# Patient Record
Sex: Male | Born: 1960 | Race: White | Hispanic: No | Smoking: Never smoker
Health system: Southern US, Community
[De-identification: ages and names within clinical notes are randomized; demographics above are authoritative.]

## PROBLEM LIST (undated history)

## (undated) DIAGNOSIS — IMO0001 Reserved for inherently not codable concepts without codable children: Secondary | ICD-10-CM

## (undated) DIAGNOSIS — E7800 Pure hypercholesterolemia, unspecified: Secondary | ICD-10-CM

## (undated) DIAGNOSIS — F419 Anxiety disorder, unspecified: Secondary | ICD-10-CM

## (undated) DIAGNOSIS — K219 Gastro-esophageal reflux disease without esophagitis: Secondary | ICD-10-CM

## (undated) HISTORY — DX: Reserved for inherently not codable concepts without codable children: IMO0001

## (undated) HISTORY — DX: Pure hypercholesterolemia, unspecified: E78.00

## (undated) HISTORY — DX: Anxiety disorder, unspecified: F41.9

## (undated) HISTORY — DX: Gastro-esophageal reflux disease without esophagitis: K21.9

## (undated) HISTORY — PX: FOOT SURGERY: SHX648

---

## 2013-05-08 ENCOUNTER — Ambulatory Visit (INDEPENDENT_AMBULATORY_CARE_PROVIDER_SITE_OTHER): Payer: BC Managed Care – PPO | Admitting: Podiatry

## 2013-05-08 ENCOUNTER — Encounter: Payer: Self-pay | Admitting: Podiatry

## 2013-05-08 VITALS — BP 162/108 | HR 77 | Resp 16 | Ht 71.0 in | Wt 235.0 lb

## 2013-05-08 DIAGNOSIS — B351 Tinea unguium: Secondary | ICD-10-CM

## 2013-05-08 DIAGNOSIS — L600 Ingrowing nail: Secondary | ICD-10-CM

## 2013-05-08 NOTE — Progress Notes (Signed)
Subjective:     Patient ID: Jordan Welch, male   DOB: 07-Aug-1960, 52 y.o.   MRN: [id number]  HPI patient complains of a very painful third nail right with chronic ingrown toenail at least 3 months duration. States that he is tried to soak it and treatment without relief of symptoms and also complains of discoloration of several nails  Review of Systems  All other systems reviewed and are negative.       Objective:   Physical Exam  Nursing note and vitals reviewed. Constitutional: He appears well-developed and well-nourished.  Cardiovascular: Intact distal pulses.   Musculoskeletal: Normal range of motion.  Skin: Skin is warm.   third nail left is quite incurvated and thickened with the C-shaped appearance. It has some redness and distal drainage from the medial border. The fourth nail right in both big toenails have yellow discoloration    Assessment:     Ingrown damage third nail right entire nail and mycotic nail infection    Plan:     H&P performed and discussed correction of nailbed. Patient wants to have this done and I have recommended permanent procedure and explained risk. Patient wants surgery and signs consent form. Infiltrated the right third toe with 60 mg Xylocaine Marcaine mixture and removed the nail I exposed the matrix and applied chemical 4 applications 30 seconds followed by alcohol lavage. Sterile dressing applied. Formula 3 to use on remaining fungal nails with long-term consideration for laser or oral medication

## 2013-05-08 NOTE — Patient Instructions (Signed)

## 2013-05-08 NOTE — Progress Notes (Signed)
N SORE L 3RD DIGIT TOENAIL RIGHT FOOT/FUNGUS TOENAILS B/L D 32M O SLOWLY C WORSE A WALKING, SHOES T PT TRIMS TOENAILS,

## 2019-06-01 ENCOUNTER — Telehealth: Payer: Self-pay | Admitting: *Deleted

## 2019-06-02 ENCOUNTER — Ambulatory Visit
Payer: No Typology Code available for payment source | Attending: Student in an Organized Health Care Education/Training Program | Admitting: Student in an Organized Health Care Education/Training Program

## 2019-06-02 ENCOUNTER — Other Ambulatory Visit: Payer: Self-pay

## 2019-06-02 ENCOUNTER — Encounter: Payer: Self-pay | Admitting: Student in an Organized Health Care Education/Training Program

## 2019-06-02 VITALS — BP 139/70 | HR 74 | Temp 97.5°F | Ht 71.0 in | Wt 240.0 lb

## 2019-06-02 DIAGNOSIS — Z9889 Other specified postprocedural states: Secondary | ICD-10-CM | POA: Insufficient documentation

## 2019-06-02 DIAGNOSIS — M47816 Spondylosis without myelopathy or radiculopathy, lumbar region: Secondary | ICD-10-CM | POA: Insufficient documentation

## 2019-06-02 DIAGNOSIS — G894 Chronic pain syndrome: Secondary | ICD-10-CM | POA: Insufficient documentation

## 2019-06-02 DIAGNOSIS — Z86018 Personal history of other benign neoplasm: Secondary | ICD-10-CM | POA: Insufficient documentation

## 2019-06-02 DIAGNOSIS — M5410 Radiculopathy, site unspecified: Secondary | ICD-10-CM | POA: Insufficient documentation

## 2019-06-02 DIAGNOSIS — M5136 Other intervertebral disc degeneration, lumbar region: Secondary | ICD-10-CM | POA: Diagnosis not present

## 2019-06-02 DIAGNOSIS — M542 Cervicalgia: Secondary | ICD-10-CM | POA: Diagnosis present

## 2019-06-02 DIAGNOSIS — M5412 Radiculopathy, cervical region: Secondary | ICD-10-CM | POA: Diagnosis not present

## 2019-06-02 DIAGNOSIS — M47812 Spondylosis without myelopathy or radiculopathy, cervical region: Secondary | ICD-10-CM | POA: Insufficient documentation

## 2019-06-02 MED ORDER — DICLOFENAC SODIUM 75 MG PO TBEC: 75.0000 mg | DELAYED_RELEASE_TABLET | Freq: Two times a day (BID) | ORAL | 0 refills | Status: AC

## 2019-06-02 MED ORDER — GABAPENTIN 300 MG PO CAPS: ORAL_CAPSULE | ORAL | 0 refills | Status: DC

## 2019-06-02 NOTE — Progress Notes (Signed)
Patient's Name: Jordan Welch  MRN: [id number]  Referring Provider: [facility]  DOB: [birth year]  PCP: [facility]  DOS: 06/02/2019  Note by: Edward Jolly, MD  Service setting: Ambulatory outpatient  Specialty: Interventional Pain Management  Location: [facility] (AMB) Pain Management Facility  Visit type: Initial Patient Evaluation  Patient type: New Patient   Primary Reason(s) for Visit: Encounter for initial evaluation of one or more chronic problems (new to examiner) potentially causing chronic pain, and posing a threat to normal musculoskeletal function. (Level of risk: High) CC: Neck Pain  HPI  Mr. Tammen is a 58 y.o. year old, male patient, who comes today to see [place] for the first time for an initial evaluation of his chronic pain. He has Cervicalgia; Cervical facet joint syndrome; Cervical spondylosis; Lumbar facet arthropathy; Lumbar degenerative disc disease; Chronic pain syndrome; and S/P excision of neuroma on their problem list. Today he comes in for evaluation of his Neck Pain  Pain Assessment: Location: Left Neck(shoulder) Radiating: Denies Onset: More than a month ago Duration: Chronic pain Quality: Sharp, Aching Severity: 3 /10 (subjective, self-reported pain score)  Note: Reported level is compatible with observation.                         When using our objective Pain Scale, levels between 6 and 10/10 are said to belong in an emergency room, as it progressively worsens from a 6/10, described as severely limiting, requiring emergency care not usually available at an outpatient pain management facility. At a 6/10 level, communication becomes difficult and requires great effort. Assistance to reach the emergency department may be required. Facial flushing and profuse sweating along with potentially dangerous increases in heart rate and blood pressure will be evident. Effect on ADL: limits my daily actvities Timing: Constant Modifying factors: nothing BP: 139/70  HR:  74  Onset and Duration: Gradual and Date of onset: more than 10 years Cause of pain: Unknown Severity: Getting worse, NAS-11 at its worse: 6/10, NAS-11 at its best: 6/10, NAS-11 now: 2/10 and NAS-11 on the average: 3/10 Timing: Not influenced by the time of the day and During activity or exercise Aggravating Factors: Kneeling, Lifiting, Prolonged standing, Walking and Working Alleviating Factors: Lying down, Resting and Sleeping Associated Problems: Fatigue Quality of Pain: Aching, Annoying, Uncomfortable and Work related Previous Examinations or Tests: Biopsy, CT scan, MRI scan, X-rays and Nerve conduction test Previous Treatments: Physical Therapy  The patient comes into the clinics today for the first time for a chronic pain management evaluation.   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  Neck pain: History of neck pain for many years.  States that it does radiate to his left shoulder.  Does not change during the day.  Describes it as achy and throbbing.  Does have occasional radiation to his left elbow and his left forearm.  Previous cervical MRI done 318 shows multilevel degenerative changes throughout his cervical spine, mild central canal stenosis at C5-C6 and C6-C7, multilevel neuroforaminal stenosis most pronounced at C5-C6 and C6-C7.  Patient denies ever having done a cervical epidural steroid injection or cervical facet block.  He has tried physical therapy approximately 2 years ago.  He occasionally does do home exercises.  Low back pain: States that he has occasional  low back pain that radiates to his right buttock and right hip.  Does not radiate beyond that.  It is worse with weightbearing.  He has tried gabapentin, Flexeril, Robaxin, NSAIDs in the past which were not very effective.  Lumbar MRI  shows minimal spondylitic changes and discopathy at L5-S1.  L5-S1 broad-based disc bulge contacts the anterior aspect of the thecal sac and there could be an annular fissure there as well.  Patient denies having done PT for his low back.  Patient also has a history of right foot pain.  He has a history of neuroma removal of his metatarsal approximately 15 years ago.  Has residual numbness and paresthesias of his toes at times.  Patient is not on chronic opioid therapy.  We will focus on interventional pain management and nonopioid analgesics.   Meds   Current Outpatient Medications:  .  atorvastatin (LIPITOR) 40 MG tablet, 20 mg daily. , Disp: , Rfl:  .  citalopram (CELEXA) 40 MG tablet, 20 mg daily. , Disp: , Rfl:  .  famotidine (PEPCID) 20 MG tablet, Take 20 mg by mouth 2 (two) times daily., Disp: , Rfl:  .  hydrOXYzine (ATARAX/VISTARIL) 10 MG tablet, Take 10 mg by mouth 2 times daily at 12 noon and 4 pm., Disp: , Rfl:  .  metFORMIN (GLUMETZA) 500 MG (MOD) 24 hr tablet, Take 500 mg by mouth daily with breakfast., Disp: , Rfl:  .  omeprazole (PRILOSEC) 20 MG capsule, Take 20 mg by mouth daily., Disp: , Rfl:  .  traZODone (DESYREL) 50 MG tablet, 150 mg daily. , Disp: , Rfl:  .  diclofenac (VOLTAREN) 75 MG EC tablet, Take 1 tablet (75 mg total) by mouth 2 (two) times daily., Disp: 60 tablet, Rfl: 0 .  gabapentin (NEURONTIN) 300 MG capsule, Take 1 capsule (300 mg total) by mouth at bedtime for 30 days, THEN 1 capsule (300 mg total) 2 (two) times daily., Disp: 150 capsule, Rfl: 0  Imaging Review    Lumbar MRI from 12/18/2018: Minimal spondylitic changes and discopathy at L5-S1 without findings of spinal canal or neuroforaminal stenosis.  Of note at L5-S1 there is mild intervertebral disc height loss and desiccation.  Broad-based disc bulge which contacts the anterior aspect of the thecal sac.  Abnormal T2 signal within the posterior aspect of the disc is suggestive of annular fissure.  Minimal  facet joint degenerative disease.  Cervical MRI: Multilevel degenerative disc changes, mild central canal stenosis at C5-C6, multilevel neuroforaminal stenosis.  ROS  Cardiovascular: Abnormal heart rhythm Pulmonary or Respiratory: No reported pulmonary signs or symptoms such as wheezing and difficulty taking a deep full breath (Asthma), difficulty blowing air out (Emphysema), coughing up mucus (Bronchitis), persistent dry cough, or temporary stoppage of breathing during sleep Neurological: No reported neurological signs or symptoms such as seizures, abnormal skin sensations, urinary and/or fecal incontinence, being born with an abnormal open spine and/or a tethered spinal cord Review of Past Neurological Studies: No results found for this or any previous visit. Psychological-Psychiatric: Anxiousness, Depressed and Prone to panicking Gastrointestinal: Reflux or heatburn Genitourinary: Passing kidney stones Hematological: No reported hematological signs or symptoms such as prolonged bleeding, low or poor functioning platelets, bruising or bleeding easily, hereditary bleeding problems, low energy levels due to low hemoglobin or being anemic Endocrine: No reported endocrine signs or symptoms such as high or low blood sugar, rapid heart rate due to high thyroid levels, obesity or weight gain due to slow thyroid or thyroid disease Rheumatologic:  No reported rheumatological signs and symptoms such as fatigue, joint pain, tenderness, swelling, redness, heat, stiffness, decreased range of motion, with or without associated rash Musculoskeletal: Negative for myasthenia gravis, muscular dystrophy, multiple sclerosis or malignant hyperthermia Work History: Working part time  Allergies  Mr. Mineer has No Known Allergies.  Note: Lab results reviewed.  PFSH  Drug: Mr. Champine  reports no history of drug use. Alcohol:  reports current alcohol use. Tobacco:  reports that he has never smoked. He has never used  smokeless tobacco. Medical:  has a past medical history of Anxiety, High cholesterol, and Reflux. Family: family history is not on file.  Past Surgical History:  Procedure Laterality Date  . FOOT SURGERY Right    Active Ambulatory Problems    Diagnosis Date Noted  . Cervicalgia 06/02/2019  . Cervical facet joint syndrome 06/02/2019  . Cervical spondylosis 06/02/2019  . Lumbar facet arthropathy 06/02/2019  . Lumbar degenerative disc disease 06/02/2019  . Chronic pain syndrome 06/02/2019  . S/P excision of neuroma 06/02/2019   Resolved Ambulatory Problems    Diagnosis Date Noted  . No Resolved Ambulatory Problems   Past Medical History:  Diagnosis Date  . Anxiety   . High cholesterol   . Reflux    Constitutional Exam  General appearance: Well nourished, well developed, and well hydrated. In no apparent acute distress Vitals:   06/02/19 1113  BP: 139/70  Pulse: 74  Temp: (!) 97.5 F (36.4 C)  SpO2: 100%  Weight: 240 lb (108.9 kg)  Height:  (1.803 m)   BMI Assessment: Estimated body mass index is 33.47 kg/m as calculated from the following:   Height as of this encounter:  (1.803 m).   Weight as of this encounter: 240 lb (108.9 kg).  BMI interpretation table: BMI level Category Range association with higher incidence of chronic pain  <18 kg/m2 Underweight   18.5-24.9 kg/m2 Ideal body weight   25-29.9 kg/m2 Overweight Increased incidence by 20%  30-34.9 kg/m2 Obese (Class I) Increased incidence by 68%  35-39.9 kg/m2 Severe obesity (Class II) Increased incidence by 136%  >40 kg/m2 Extreme obesity (Class III) Increased incidence by 254%   Patient's current BMI Ideal Body weight  Body mass index is 33.47 kg/m. Ideal body weight: 75.3 kg (166 lb 0.1 oz) Adjusted ideal body weight: 88.7 kg (195 lb 9.7 oz)   BMI Readings from Last 4 Encounters:  06/02/19 33.47 kg/m  05/08/13 32.78 kg/m   Wt Readings from Last 4 Encounters:  06/02/19 240 lb (108.9 kg)   05/08/13 235 lb (106.6 kg)  Psych/Mental status: Alert, oriented x 3 (person, place, & time)       Eyes: PERLA Respiratory: No evidence of acute respiratory distress  Cervical Spine Area Exam  Skin & Axial Inspection: No masses, redness, edema, swelling, or associated skin lesions Alignment: Symmetrical Functional ROM: Pain restricted ROM, to the left Stability: No instability detected Muscle Tone/Strength: Functionally intact. No obvious neuro-muscular anomalies detected. Sensory (Neurological): Dermatomal pain pattern and MSK Palpation: No palpable anomalies              Upper Extremity (UE) Exam    Side: Right upper extremity  Side: Left upper extremity  Skin & Extremity Inspection: Skin color, temperature, and hair growth are WNL. No peripheral edema or cyanosis. No masses, redness, swelling, asymmetry, or associated skin lesions. No contractures.  Skin & Extremity Inspection: Skin color, temperature, and hair growth are WNL. No peripheral edema or cyanosis. No masses,  redness, swelling, asymmetry, or associated skin lesions. No contractures.  Functional ROM: Unrestricted ROM          Functional ROM: Pain restricted ROM          Muscle Tone/Strength: Functionally intact. No obvious neuro-muscular anomalies detected.  Muscle Tone/Strength: Functionally intact. No obvious neuro-muscular anomalies detected.  Sensory (Neurological): Unimpaired          Sensory (Neurological): Musculoskeletal pain pattern          Palpation: No palpable anomalies              Palpation: No palpable anomalies              Provocative Test(s):  Phalen's test: deferred Tinel's test: deferred Apley's scratch test (touch opposite shoulder):  Action 1 (Across chest): deferred Action 2 (Overhead): deferred Action 3 (LB reach): deferred   Provocative Test(s):  Phalen's test: deferred Tinel's test: deferred Apley's scratch test (touch opposite shoulder):  Action 1 (Across chest): deferred Action 2  (Overhead): deferred Action 3 (LB reach): deferred    Thoracic Spine Area Exam  Skin & Axial Inspection: No masses, redness, or swelling Alignment: Symmetrical Functional ROM: Unrestricted ROM Stability: No instability detected Muscle Tone/Strength: Functionally intact. No obvious neuro-muscular anomalies detected. Sensory (Neurological): Unimpaired Muscle strength & Tone: No palpable anomalies  Lumbar Spine Area Exam  Skin & Axial Inspection: No masses, redness, or swelling Alignment: Symmetrical Functional ROM: Unrestricted ROM       Stability: No instability detected Muscle Tone/Strength: Functionally intact. No obvious neuro-muscular anomalies detected. Sensory (Neurological): Unimpaired Palpation: No palpable anomalies       Provocative Tests: Hyperextension/rotation test: deferred today       Lumbar quadrant test (Kemp's test): deferred today       Lateral bending test: deferred today       Patrick's Maneuver: deferred today                   FABER* test: deferred today                   S-I anterior distraction/compression test: deferred today         S-I lateral compression test: deferred today         S-I Thigh-thrust test: deferred today         S-I Gaenslen's test: deferred today         *(Flexion, ABduction and External Rotation)  Gait & Posture Assessment  Ambulation: Unassisted Gait: Relatively normal for age and body habitus Posture: WNL   Lower Extremity Exam    Side: Right lower extremity  Side: Left lower extremity  Stability: No instability observed          Stability: No instability observed          Skin & Extremity Inspection: Skin color, temperature, and hair growth are WNL. No peripheral edema or cyanosis. No masses, redness, swelling, asymmetry, or associated skin lesions. No contractures.  Skin & Extremity Inspection: Skin color, temperature, and hair growth are WNL. No peripheral edema or cyanosis. No masses, redness, swelling, asymmetry, or  associated skin lesions. No contractures.  Functional ROM: Unrestricted ROM                  Functional ROM: Unrestricted ROM                  Muscle Tone/Strength: Functionally intact. No obvious neuro-muscular anomalies detected.  Muscle Tone/Strength: Functionally intact. No obvious neuro-muscular anomalies detected.  Sensory (Neurological): Unimpaired        Sensory (Neurological): Unimpaired        DTR: Patellar: 2+: normal Achilles: 2+: normal Plantar: deferred today  DTR: Patellar: 2+: normal Achilles: 2+: normal Plantar: deferred today  Palpation: No palpable anomalies  Palpation: No palpable anomalies   Assessment  Primary Diagnosis & Pertinent Problem List: The primary encounter diagnosis was Cervical radicular pain. Diagnoses of Cervical facet joint syndrome, Cervical spondylosis, Lumbar facet arthropathy, Lumbar degenerative disc disease, Chronic pain syndrome, Cervicalgia, and S/P excision of neuroma were also pertinent to this visit.  Visit Diagnosis (New problems to examiner): 1. Cervical radicular pain   2. Cervical facet joint syndrome   3. Cervical spondylosis   4. Lumbar facet arthropathy   5. Lumbar degenerative disc disease   6. Chronic pain syndrome   7. Cervicalgia   8. S/P excision of neuroma    General Recommendations: The pain condition that the patient suffers from is best treated with a multidisciplinary approach that involves an increase in physical activity to prevent de-conditioning and worsening of the pain cycle, as well as psychological counseling (formal and/or informal) to address the co-morbid psychological affects of pain. Treatment will often involve judicious use of pain medications and interventional procedures to decrease the pain, allowing the patient to participate in the physical activity that will ultimately produce long-lasting pain reductions. The goal of the multidisciplinary approach is to return the patient to a higher level of overall  function and to restore their ability to perform activities of daily living.   Plan of Care (Initial workup plan)  I believe a large component of the patient's neck pain could be related to his cervical spine pathology.  Would like to obtain updated MRI with imaging that I can review myself to evaluate for neuroforaminal and canal stenosis.  After cervical MRI can discuss therapeutic options which may include diagnostic cervical epidural steroid injection and or cervical facet medial branch nerve blocks and possible radiofrequency ablation.  We will wait to discuss therapeutic options once we have MRI results.  In regard to the patient's low back pain, although the patient does have L5-S1 disc herniation, his pain does not sound radicular in nature completely.  He has isolated right foot pain as well as a result of his neuroma excision.  Recommend medication management as below.  Will order MRI of C-spine and discussed therapeutic plan in regards to interventional options.   Imaging Orders     MRI C-Spine w/o contrast Pharmacotherapy (current): Medications ordered:  Meds ordered this encounter  Medications  . gabapentin (NEURONTIN) 300 MG capsule    Sig: Take 1 capsule (300 mg total) by mouth at bedtime for 30 days, THEN 1 capsule (300 mg total) 2 (two) times daily.    Dispense:  150 capsule    Refill:  0  . diclofenac (VOLTAREN) 75 MG EC tablet    Sig: Take 1 tablet (75 mg total) by mouth 2 (two) times daily.    Dispense:  60 tablet    Refill:  0   Medications administered during this visit: [name] had no medications administered during this visit.   Pharmacological management options:  Opioid Analgesics: Focus on nonopioid analgesics  Membrane stabilizer: On gabapentin.  Can consider Lyrica or Cymbalta in future  Muscle relaxant: Tried and failed Flexeril, Robaxin  NSAID: Trial of diclofenac as below  Other analgesic(s): To be determined at a later time   Interventional  management options: Mr. Alves was  informed that there is no guarantee that he would be a candidate for interventional therapies. The decision will be based on the results of diagnostic studies, as well as Mr. Dosher risk profile.  Procedure(s) under consideration:  Cervical epidural steroid injection Cervical facet medial branch nerve blocks Cervical radiofrequency ablation Lumbar epidural steroid injection Lumbar facet blocks   Provider-requested follow-up: Return for pt will call for 2nd visit , After Imaging (C-MRI).  No future appointments.  Primary Care Physician: [facility] Location[facility] Note by: [name], MD Date: 06/02/2019; Time: 1:16 PM  Note: This dictation was prepared with Dragon dictation. Any transcriptional errors that may result from this process are unintentional.

## 2019-06-03 ENCOUNTER — Telehealth: Payer: Self-pay | Admitting: *Deleted

## 2019-06-16 ENCOUNTER — Other Ambulatory Visit: Payer: Self-pay

## 2019-06-16 ENCOUNTER — Ambulatory Visit
Admission: RE | Admit: 2019-06-16 | Discharge: 2019-06-16 | Disposition: A | Discharge status: Home / Self Care | Payer: No Typology Code available for payment source | Source: Ambulatory Visit | Attending: Student in an Organized Health Care Education/Training Program | Admitting: Student in an Organized Health Care Education/Training Program

## 2019-06-16 DIAGNOSIS — M542 Cervicalgia: Secondary | ICD-10-CM | POA: Insufficient documentation

## 2019-06-16 DIAGNOSIS — M5412 Radiculopathy, cervical region: Secondary | ICD-10-CM | POA: Diagnosis present

## 2019-06-16 IMAGING — MR MR CERVICAL SPINE W/O CM
5 series · 39 of 48 positions shown · non-contrast
Comparison: None.

CLINICAL DATA: Neck popping and cracking with intermittent pain
radiating into the left shoulder. No acute injury or prior relevant
surgery.

EXAM:
MRI CERVICAL SPINE WITHOUT CONTRAST
TECHNIQUE: Multiplanar, multisequence MR imaging of the cervical spine was
performed. No intravenous contrast was administered.

[Series 5: T2 · sagittal · 3.0mm · 0.62mm/px · 6 of 15 slices shown (1 of 2)]
[im 1/15]
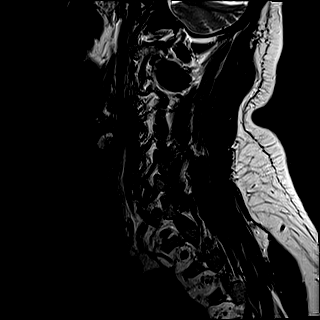
[im 3/15]
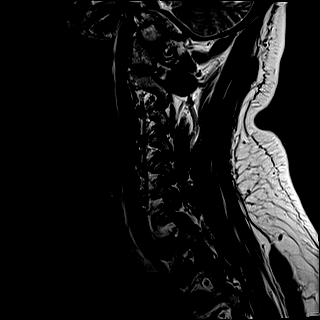
[im 6/15]
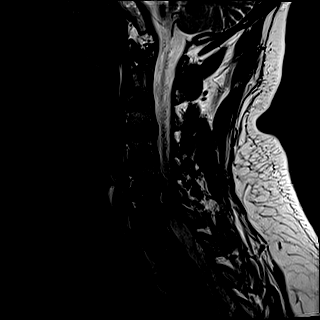
[im 9/15]
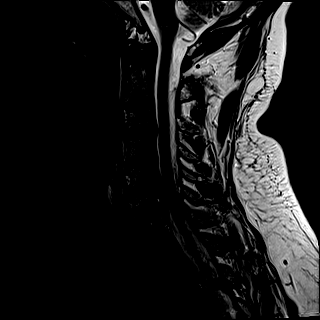
[im 12/15]
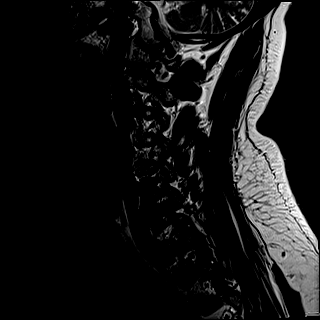
[im 15/15]
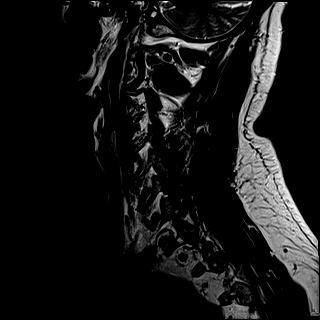

[Series 6: FLAIR · sagittal · 3.0mm · 0.78mm/px · 7 of 15 slices shown]
[im 1/15]
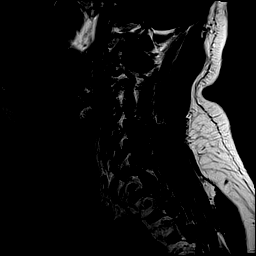
[im 3/15]
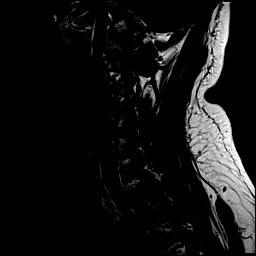
[im 5/15]
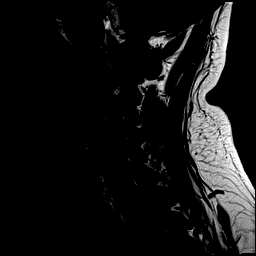
[im 8/15]
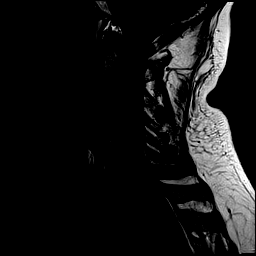
[im 10/15]
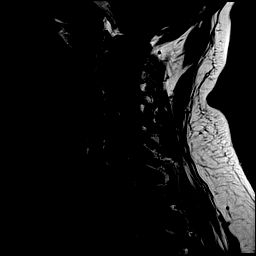
[im 12/15]
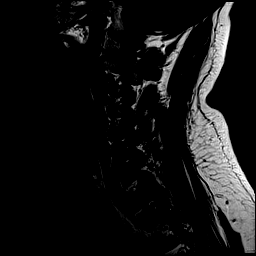
[im 15/15]
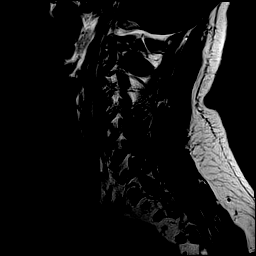

[Series 7: STIR · sagittal · 3.0mm · 0.62mm/px · 7 of 15 slices shown]
[im 1/15]
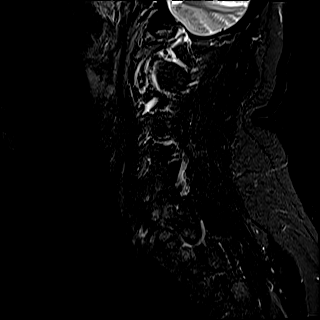
[im 3/15]
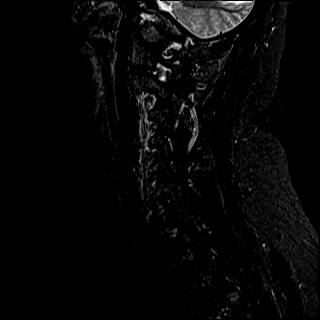
[im 5/15]
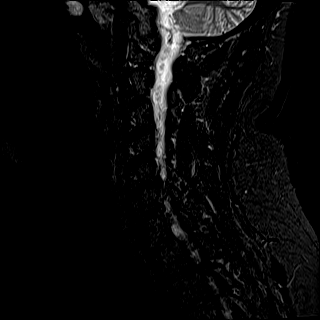
[im 8/15]
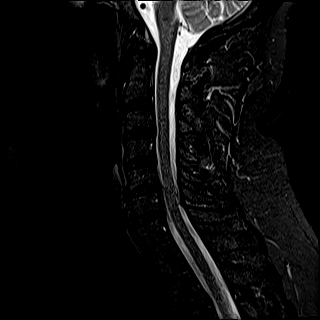
[im 10/15]
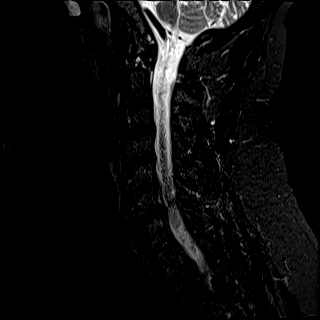
[im 12/15]
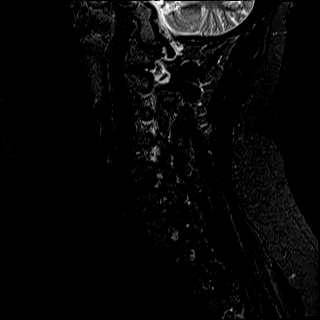
[im 15/15]
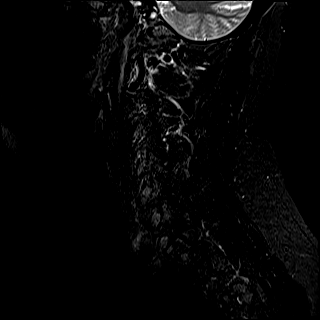

[Series 8: T2 · axial · 3.0mm · 0.70mm/px · z∈[-143,-40]mm · 11 of 31 slices shown (2 of 2)]
[im 1/31]
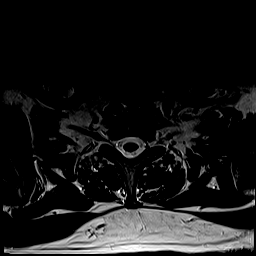
[im 3/31]
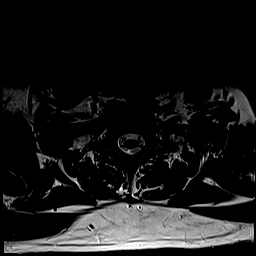
[im 5/31]
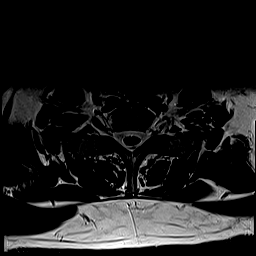
[im 7/31]
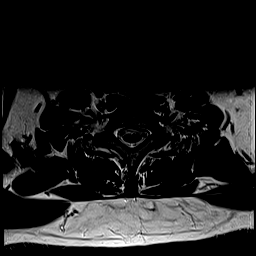
[im 10/31]
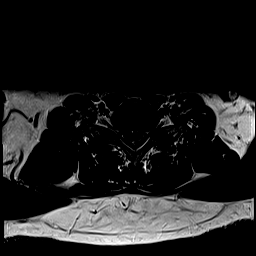
[im 12/31]
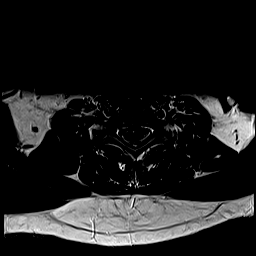
[im 14/31]
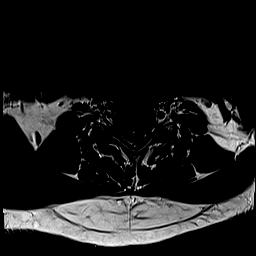
[im 17/31]
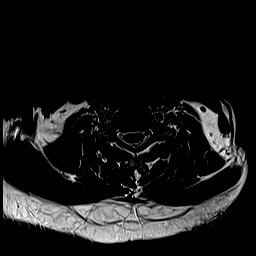
[im 21/31]
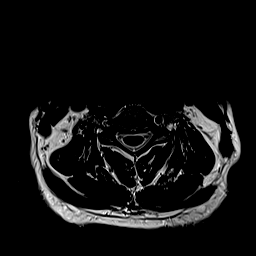
[im 26/31]
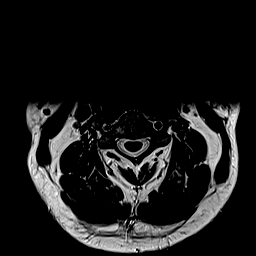
[im 31/31]
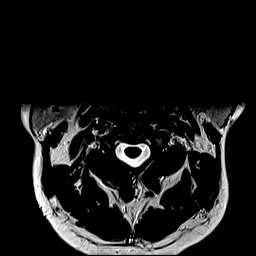

[Series 9: ax mpgr · axial · 3.0mm · 0.35mm/px · z∈[-143,-40]mm · 8 of 31 slices shown]
[im 1/31]
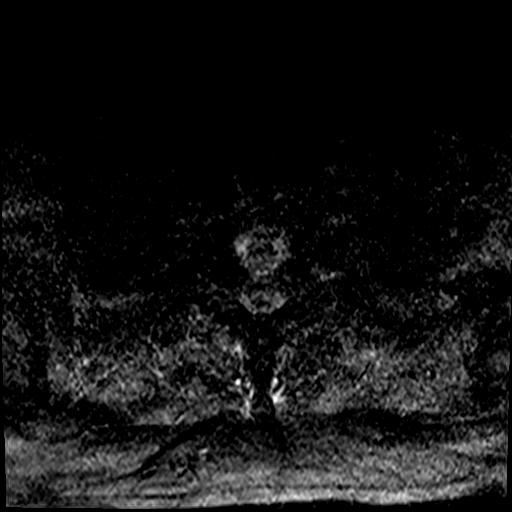
[im 5/31]
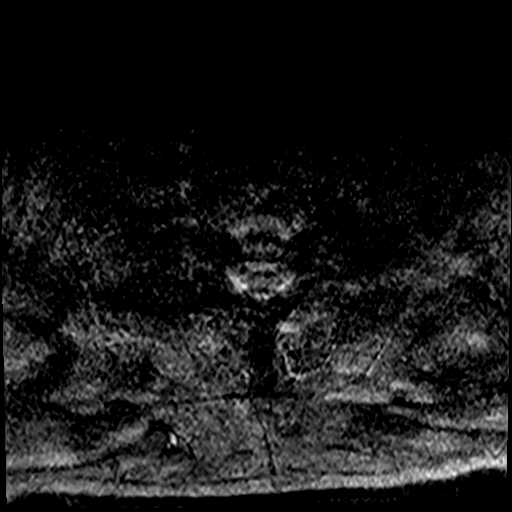
[im 10/31]
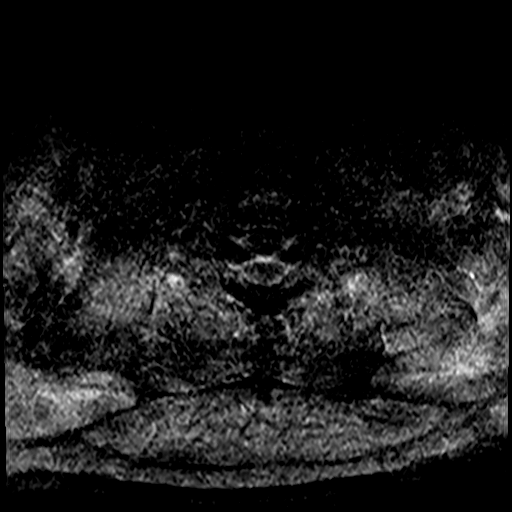
[im 14/31]
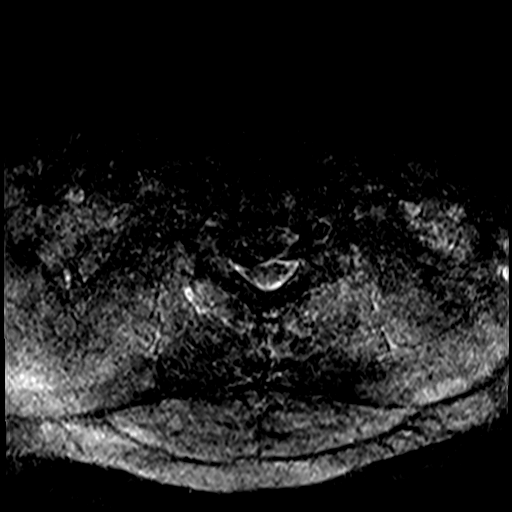
[im 17/31]
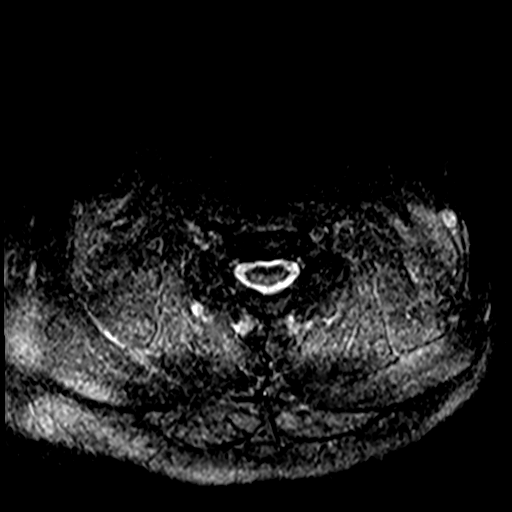
[im 21/31]
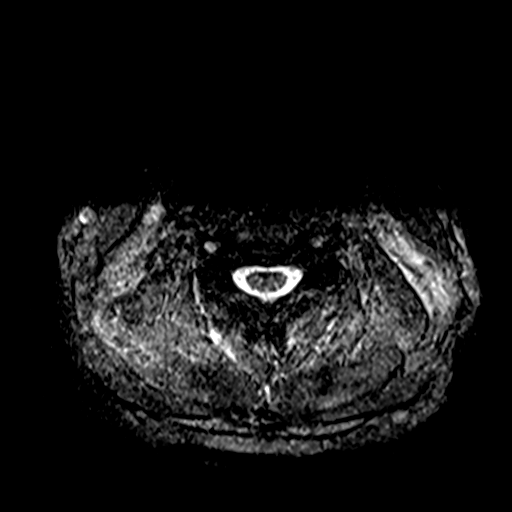
[im 26/31]
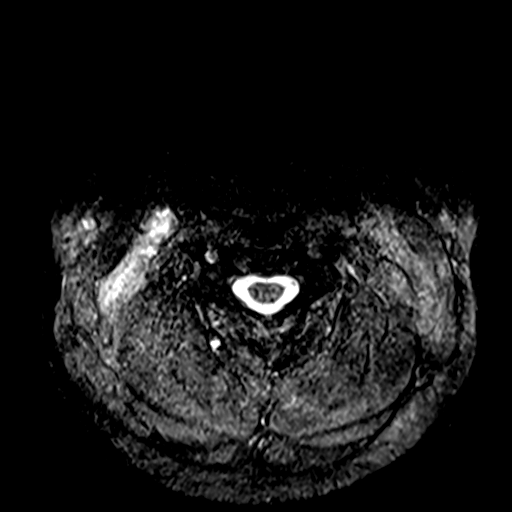
[im 31/31]
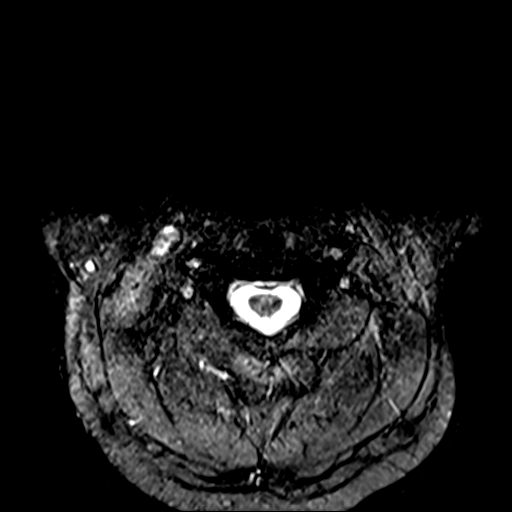

[39 of 48 positions shown; findings below may reference images not displayed]

FINDINGS: Alignment: Normal.

Vertebrae: No acute or suspicious osseous findings.

Cord: Normal in signal and caliber.

Posterior Fossa, vertebral arteries, paraspinal tissues: Visualized
portions of the posterior fossa and paraspinal soft tissues appear
unremarkable. Bilateral vertebral artery flow voids.

Disc levels:

C2-3: Normal interspace.

C3-4: Mild disc bulging and uncinate spurring. No spinal stenosis or
nerve root encroachment.

C4-5: Mild disc bulging and uncinate spurring. No spinal stenosis or
nerve root encroachment.

C5-6: Disc bulging with a right foraminal disc osteophyte complex
causing at least moderate right foraminal narrowing and probable
right C6 nerve root encroachment. The left foramen is mildly
narrowed. No cord deformity.

C6-7: Spondylosis with disc bulging and asymmetric left foraminal
disc osteophyte complex. There is moderate left and mild right
foraminal narrowing. No cord deformity.

C7-T1: Normal interspace.
IMPRESSION: 1. Right foraminal disc osteophyte complex at C5-6 contributes to at
least moderate right foraminal narrowing and probable right C6 nerve
root encroachment.
2. Moderate left and mild right foraminal narrowing at C6-7 with
potential nerve root encroachment.
3. No cord deformity or abnormal cord signal.
4. No acute osseous findings.

## 2019-06-24 ENCOUNTER — Telehealth: Payer: Self-pay

## 2019-06-24 ENCOUNTER — Telehealth: Payer: Self-pay | Admitting: *Deleted

## 2019-06-24 NOTE — Telephone Encounter (Signed)
He returned call re tomorrows appointment

## 2019-06-24 NOTE — Telephone Encounter (Signed)
Attempted to call patient again for pre appointment review of meds/allergies. Message left.

## 2019-06-25 ENCOUNTER — Ambulatory Visit
Payer: No Typology Code available for payment source | Attending: Student in an Organized Health Care Education/Training Program | Admitting: Student in an Organized Health Care Education/Training Program

## 2019-06-25 ENCOUNTER — Encounter: Payer: Self-pay | Admitting: Student in an Organized Health Care Education/Training Program

## 2019-06-25 ENCOUNTER — Other Ambulatory Visit: Payer: Self-pay

## 2019-06-25 DIAGNOSIS — G894 Chronic pain syndrome: Secondary | ICD-10-CM | POA: Diagnosis not present

## 2019-06-25 DIAGNOSIS — M5412 Radiculopathy, cervical region: Secondary | ICD-10-CM | POA: Diagnosis not present

## 2019-06-25 DIAGNOSIS — M4802 Spinal stenosis, cervical region: Secondary | ICD-10-CM

## 2019-06-25 DIAGNOSIS — M542 Cervicalgia: Secondary | ICD-10-CM

## 2019-06-25 NOTE — Progress Notes (Signed)
Pain Management Virtual Encounter Note - Virtual Visit via [place] (real-time audio visits between healthcare provider and patient).   Patient's Phone No. & Preferred Pharmacy:  [phone] (home); There is no such number on file (mobile).; (Preferred) [phone] No e-mail address on record  [place], [place] [address] Phone: [phone] Fax: [phone]  [place], [address] [place] [place]. [place] [address] Phone: [phone] Fax: [phone]    Pre-screening note:  Our staff contacted Mr. Hustead and offered him an "in person", "face-to-face" appointment versus a telephone encounter. He indicated preferring the telephone encounter, at this time.   Reason for Virtual Visit: COVID-19*  Social distancing based on CDC and AMA recommendations.   I contacted Jordan Welch on 06/25/2019 via video conference.      I clearly identified myself as [name], MD. I verified that I was speaking with the correct person using two identifiers (Name: Jordan Welch, and date of birth: Dec 02, 1960).  Advanced Informed Consent I sought verbal advanced consent from Jordan Welch for virtual visit interactions. I informed Mr. Bluemel of possible security and privacy concerns, risks, and limitations associated with providing "not-in-person" medical evaluation and management services. I also informed Mr. Jozwiak of the availability of "in-person" appointments. Finally, I informed him that there would be a charge for the virtual visit and that he could be  personally, fully or partially, financially responsible for it. Mr. Flaum expressed understanding and agreed to proceed.   Historic Elements   [name] is a 58 y.o. year old, male patient evaluated today after his last encounter by our practice on 06/24/2019. Mr. Wiler  has a past medical history of Anxiety, High  cholesterol, and Reflux. He also  has a past surgical history that includes Foot surgery (Right). Mr. Corbit has a current medication list which includes the following prescription(s): atorvastatin, citalopram, diclofenac, famotidine, gabapentin, hydroxyzine, metformin, omeprazole, and trazodone. He  reports that he has never smoked. He has never used smokeless tobacco. He reports current alcohol use. He reports that he does not use drugs. Mr. Deady has No Known Allergies.   HPI  Today, he is being contacted for follow-up evaluation to review C-MRI  Worsening left neck pain across shoulders. Right shoulder painful as well. No changes since first eval  Imaging  MRI C-Spine w/o contrast CLINICAL DATA:  Neck popping and cracking with intermittent pain radiating into the left shoulder. No acute injury or prior relevant surgery.  EXAM: MRI CERVICAL SPINE WITHOUT CONTRAST  TECHNIQUE: Multiplanar, multisequence MR imaging of the cervical spine was performed. No intravenous contrast was administered.  COMPARISON:  None.  FINDINGS: Alignment: Normal.  Vertebrae: No acute or suspicious osseous findings.  Cord: Normal in signal and caliber.  Posterior Fossa, vertebral arteries, paraspinal tissues: Visualized portions of the posterior fossa and paraspinal soft tissues appear unremarkable. Bilateral vertebral artery flow voids.  Disc levels:  C2-3: Normal interspace.  C3-4: Mild disc bulging and uncinate spurring. No spinal stenosis or nerve root encroachment.  C4-5: Mild disc bulging and uncinate spurring. No spinal stenosis or nerve root encroachment.  C5-6: Disc bulging with a right foraminal disc osteophyte complex causing at least moderate right foraminal narrowing and probable right C6 nerve root encroachment. The left foramen is mildly narrowed. No cord deformity.  C6-7: Spondylosis with disc bulging and asymmetric left foraminal disc osteophyte complex. There is moderate  left and mild right foraminal narrowing. No cord deformity.  C7-T1: Normal interspace.  IMPRESSION: 1. Right foraminal disc osteophyte complex at C5-6 contributes to at least moderate right foraminal narrowing and probable right C6 nerve root encroachment. 2. Moderate left and mild right foraminal narrowing at C6-7 with potential nerve root encroachment. 3. No cord deformity or abnormal cord signal. 4. No acute osseous findings.  Electronically Signed   By: Carey Bullocks M.D.   On: 06/16/2019 13:29   Assessment  The primary encounter diagnosis was Cervical radicular pain. 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  Plan of Care  I am having [name] maintain his citalopram, traZODone, atorvastatin, omeprazole, metFORMIN, famotidine, hydrOXYzine, gabapentin, and diclofenac.  1. Cervical radicular pain -+neck and shoulder pain related to right c5-6 foraminal narrowing likely c6 radiculopathy. Plan for c7-t1 ESI. - CESI (Schedule); Future  2. 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 Chronic pain syndrome -CESI, continbue Gabapentin and Diclofenac as above  Orders:  Orders Placed This Encounter  Procedures  . CESI (Schedule)    Level(s): C7-T1 Laterality: TBD Purpose: Diagnostic/Therapeutic Indication(s): Radiculitis and cervicalgia associater with cervical degenerative disc disease.    Standing Status:   Future    Standing Expiration Date:   07/26/2019    Scheduling Instructions:     Procedure: Cervical Epidural Steroid Injection/Block     Sedation:without     Timeframe: As soon as schedule allows    Order Specific Question:   Where will this procedure be performed?    Answer:   [facility]    Comments:   Jaquavion Mccannon   Follow-up plan:   Return in about 2 weeks (around 07/09/2019) for C7-T1 ESI, without sedation.     Recent  Visits Date Type Provider Dept  06/02/19 Office Visit Edward Jolly, MD [facility]  Showing recent visits within past 90 days and meeting all other requirements   Today's Visits Date Type Provider Dept  06/25/19 Office Visit Edward Jolly, MD [facility]  Showing today's visits and meeting all other requirements   Future Appointments No visits were found meeting these conditions.  Showing future appointments within next 90 days and meeting all other requirements   I discussed the assessment and treatment plan with the patient. The patient was provided an opportunity to ask questions and all were answered. The patient agreed with the plan and demonstrated an understanding of the instructions.  Patient advised to call back or seek an in-person evaluation if the symptoms or condition worsens.  Total duration of non-face-to-face encounter: .  Note by: Edward Jolly, MD Date: 06/25/2019; Time: 2:40 PM  Note: This dictation was prepared with Dragon dictation. Any transcriptional errors that may result from this process are unintentional.  Disclaimer:  * Given the special circumstances of the COVID-19 pandemic, the federal government has announced that the Office for Civil Rights (OCR) will exercise its enforcement discretion and will not impose penalties on physicians using telehealth in the event of noncompliance with regulatory requirements under the [employer] Portability and Accountability Act (HIPAA) in connection with the good faith provision of telehealth during the COVID-19 national public health emergency. (AMA)

## 2019-07-08 ENCOUNTER — Ambulatory Visit
Payer: No Typology Code available for payment source | Admitting: Student in an Organized Health Care Education/Training Program

## 2019-07-15 ENCOUNTER — Ambulatory Visit
Payer: No Typology Code available for payment source | Admitting: Student in an Organized Health Care Education/Training Program

## 2019-07-27 ENCOUNTER — Ambulatory Visit
Admission: RE | Admit: 2019-07-27 | Discharge: 2019-07-27 | Disposition: A | Discharge status: Home / Self Care | Payer: No Typology Code available for payment source | Source: Ambulatory Visit | Attending: Student in an Organized Health Care Education/Training Program | Admitting: Student in an Organized Health Care Education/Training Program

## 2019-07-27 ENCOUNTER — Other Ambulatory Visit: Payer: Self-pay

## 2019-07-27 ENCOUNTER — Ambulatory Visit (HOSPITAL_BASED_OUTPATIENT_CLINIC_OR_DEPARTMENT_OTHER)
Payer: No Typology Code available for payment source | Admitting: Student in an Organized Health Care Education/Training Program

## 2019-07-27 ENCOUNTER — Encounter: Payer: Self-pay | Admitting: Student in an Organized Health Care Education/Training Program

## 2019-07-27 VITALS — BP 168/91 | HR 83 | Resp 16 | Ht 71.0 in | Wt 240.0 lb

## 2019-07-27 DIAGNOSIS — M5412 Radiculopathy, cervical region: Secondary | ICD-10-CM | POA: Insufficient documentation

## 2019-07-27 MED ORDER — LIDOCAINE HCL 2 % IJ SOLN
20.0000 mL | Freq: Once | INTRAMUSCULAR | Status: AC
  Administered 2019-07-27: 400 mg
  Filled 2019-07-27: qty 40

## 2019-07-27 MED ORDER — DEXAMETHASONE SODIUM PHOSPHATE 10 MG/ML IJ SOLN
10.0000 mg | Freq: Once | INTRAMUSCULAR | Status: AC
  Administered 2019-07-27: 10 mg
  Filled 2019-07-27: qty 1

## 2019-07-27 MED ORDER — ROPIVACAINE HCL 2 MG/ML IJ SOLN
1.0000 mL | Freq: Once | INTRAMUSCULAR | Status: AC
  Administered 2019-07-27: 1 mL via EPIDURAL
  Filled 2019-07-27: qty 10

## 2019-07-27 MED ORDER — SODIUM CHLORIDE 0.9% FLUSH
1.0000 mL | Freq: Once | INTRAVENOUS | Status: AC
  Administered 2019-07-27: 1 mL

## 2019-07-27 MED ORDER — IOHEXOL 180 MG/ML  SOLN
10.0000 mL | Freq: Once | INTRAMUSCULAR | Status: AC
  Administered 2019-07-27: 10 mL via EPIDURAL
  Filled 2019-07-27: qty 20

## 2019-07-27 NOTE — Progress Notes (Signed)
Safety precautions to be maintained throughout the outpatient stay will include: orient to surroundings, keep bed in low position, maintain call bell within reach at all times, provide assistance with transfer out of bed and ambulation.  

## 2019-07-27 NOTE — Progress Notes (Signed)
Patient's Name: Jordan Welch  MRN: [id number]  Referring Provider: [facility]  DOB: 06/26/1961  PCP: [facility]  DOS: 07/27/2019  Note by: Edward Jolly, MD  Service setting: Ambulatory outpatient  Specialty: Interventional Pain Management  Patient type: Established  Location: [facility] (AMB) [facility] Facility  Visit type: Interventional Procedure   Primary Reason for Visit: Interventional Pain Management Treatment. CC: Neck Pain (left side)  Procedure:          Anesthesia, Analgesia, Anxiolysis:  Type: Diagnostic, Inter-Laminar, Cervical Epidural Steroid Injection  #1  Region: Posterior Cervico-thoracic Region Level: C7-T1 Laterality: Left-Sided Paramedial  Type: Local Anesthesia  Local Anesthetic: Lidocaine 1-2%  Position: Prone with head of the table was raised to facilitate breathing.   Indications: 1. Cervical radicular pain    Pain Score: Pre-procedure: 1 /10 Post-procedure: 0-No pain/10   Pre-op Assessment:  Jordan Welch is a 59 y.o. (year old), male patient, seen today for interventional treatment. He  has a past surgical history that includes Foot surgery (Right). Jordan Welch has a current medication list which includes the following prescription(s): atorvastatin, citalopram, famotidine, gabapentin, hydroxyzine, metformin, trazodone, and omeprazole. His primarily concern today is the Neck Pain (left side)  Initial Vital Signs:  Pulse/HCG Rate: 83ECG Heart Rate: 85 Temp:   Resp: 16 BP: (!) 155/87 SpO2: 99 %  BMI: Estimated body mass index is 33.47 kg/m as calculated from the following:   Height as of this encounter: 5\' 11"  (1.803 m).   Weight as of this encounter: 240 lb (108.9 kg).  Risk Assessment: Allergies: Reviewed. He has No Known Allergies.  Allergy Precautions: None required Coagulopathies: Reviewed. None identified.  Blood-thinner therapy: None at this time Active Infection(s): Reviewed. None identified. Jordan Welch is afebrile  Site Confirmation:  Jordan Welch was asked to confirm the procedure and laterality before marking the site Procedure checklist: Completed Consent: Before the procedure and under the influence of no sedative(s), amnesic(s), or anxiolytics, the patient was informed of the treatment options, risks and possible complications. To fulfill our ethical and legal obligations, as recommended by the American Medical Association's Code of Ethics, I have informed the patient of my clinical impression; the nature and purpose of the treatment or procedure; the risks, benefits, and possible complications of the intervention; the alternatives, including doing nothing; the risk(s) and benefit(s) of the alternative treatment(s) or procedure(s); and the risk(s) and benefit(s) of doing nothing. The patient was provided information about the general risks and possible complications associated with the procedure. These may include, but are not limited to: failure to achieve desired goals, infection, bleeding, organ or nerve damage, allergic reactions, paralysis, and death. In addition, the patient was informed of those risks and complications associated to Spine-related procedures, such as failure to decrease pain; infection (i.e.: Meningitis, epidural or intraspinal abscess); bleeding (i.e.: epidural hematoma, subarachnoid hemorrhage, or any other type of intraspinal or peri-dural bleeding); organ or nerve damage (i.e.: Any type of peripheral nerve, nerve root, or spinal cord injury) with subsequent damage to sensory, motor, and/or autonomic systems, resulting in permanent pain, numbness, and/or weakness of one or several areas of the body; allergic reactions; (i.e.: anaphylactic reaction); and/or death. Furthermore, the patient was informed of those risks and complications associated with the medications. These include, but are not limited to: allergic reactions (i.e.: anaphylactic or anaphylactoid reaction(s)); adrenal axis suppression; blood sugar  elevation that in diabetics may result in ketoacidosis or comma; water retention that in patients with history of congestive heart failure  may result in shortness of breath, pulmonary edema, and decompensation with resultant heart failure; weight gain; swelling or edema; medication-induced neural toxicity; particulate matter embolism and blood vessel occlusion with resultant organ, and/or nervous system infarction; and/or aseptic necrosis of one or more joints. Finally, the patient was informed that Medicine is not an exact science; therefore, there is also the possibility of unforeseen or unpredictable risks and/or possible complications that may result in a catastrophic outcome. The patient indicated having understood very clearly. We have given the patient no guarantees and we have made no promises. Enough time was given to the patient to ask questions, all of which were answered to the patient's satisfaction. Jordan Welch has indicated that he wanted to continue with the procedure. Attestation: I, the ordering provider, attest that I have discussed with the patient the benefits, risks, side-effects, alternatives, likelihood of achieving goals, and potential problems during recovery for the procedure that I have provided informed consent. Date  Time: 07/27/2019 11:22 AM  Pre-Procedure Preparation:  Monitoring: As per clinic protocol. Respiration, ETCO2, SpO2, BP, heart rate and rhythm monitor placed and checked for adequate function Safety Precautions: Patient was assessed for positional comfort and pressure points before starting the procedure. Time-out: I initiated and conducted the "Time-out" before starting the procedure, as per protocol. The patient was asked to participate by confirming the accuracy of the "Time Out" information. Verification of the correct person, site, and procedure were performed and confirmed by me, the nursing staff, and the patient. "Time-out" conducted as per Joint Commission's  Universal Protocol (UP.01.01.01). Time: 1224  Description of Procedure:          Target Area: For Epidural Steroid injections the target is the interlaminar space, initially targeting the lower border of the superior vertebral body lamina. Approach: Paramedial approach. Area Prepped: Entire PosteriorCervical Region Prepping solution: DuraPrep (Iodine Povacrylex [0.7% available iodine] and Isopropyl Alcohol, 74% w/w) Safety Precautions: Aspiration looking for blood return was conducted prior to all injections. At no point did we inject any substances, as a needle was being advanced. No attempts were made at seeking any paresthesias. Safe injection practices and needle disposal techniques used. Medications properly checked for expiration dates. SDV (single dose vial) medications used. Description of the Procedure: Protocol guidelines were followed. The procedure needle was introduced through the skin, ipsilateral to the reported pain, and advanced to the target area. Bone was contacted and the needle walked caudad, until the lamina was cleared. The epidural space was identified using "loss-of-resistance technique" with 2-3 ml of PF-NaCl (0.9% NSS), in a 5cc LOR glass syringe. Vitals:   07/27/19 1127 07/27/19 1224 07/27/19 1229 07/27/19 1231  BP: (!) 155/87 (!) 168/87 (!) 168/91   Pulse: 83     Resp: 16 12 14 16   SpO2: 99% 100% 97% 98%  Weight: 240 lb (108.9 kg)     Height: 5\' 11"  (1.803 m)       Start Time: 1224 hrs. End Time: 1230 hrs. Materials:  Needle(s) Type: Epidural needle Gauge: 22G Length: 3.5-in Medication(s): Please see orders for medications and dosing details. 4 cc solution made of 2 cc of preservative-free saline, 1 cc of 0.2% ropivacaine, 1 cc of Decadron 10 mg/cc.  Imaging Guidance (Spinal):          Type of Imaging Technique: Fluoroscopy Guidance (Spinal) Indication(s): Assistance in needle guidance and placement for procedures requiring needle placement in or near  specific anatomical locations not easily accessible without such assistance. Exposure Time: Please see nurses  notes. Contrast: Before injecting any contrast, we confirmed that the patient did not have an allergy to iodine, shellfish, or radiological contrast. Once satisfactory needle placement was completed at the desired level, radiological contrast was injected. Contrast injected under live fluoroscopy. No contrast complications. See chart for type and volume of contrast used. Fluoroscopic Guidance: I was personally present during the use of fluoroscopy. "Tunnel Vision Technique" used to obtain the best possible view of the target area. Parallax error corrected before commencing the procedure. "Direction-depth-direction" technique used to introduce the needle under continuous pulsed fluoroscopy. Once target was reached, antero-posterior, oblique, and lateral fluoroscopic projection used confirm needle placement in all planes. Images permanently stored in EMR. Interpretation: I personally interpreted the imaging intraoperatively. Adequate needle placement confirmed in multiple planes. Appropriate spread of contrast into desired area was observed. No evidence of afferent or efferent intravascular uptake. No intrathecal or subarachnoid spread observed. Permanent images saved into the patient's record.  Antibiotic Prophylaxis:   Anti-infectives (From admission, onward)   None     Indication(s): None identified  Post-operative Assessment:  Post-procedure Vital Signs:  Pulse/HCG Rate: 8381 Temp:   Resp: 16 BP: (!) 168/91 SpO2: 98 %  EBL: None  Complications: No immediate post-treatment complications observed by team, or reported by patient.  Note: The patient tolerated the entire procedure well. A repeat set of vitals were taken after the procedure and the patient was kept under observation following institutional policy, for this type of procedure. Post-procedural neurological assessment was  performed, showing return to baseline, prior to discharge. The patient was provided with post-procedure discharge instructions, including a section on how to identify potential problems. Should any problems arise concerning this procedure, the patient was given instructions to immediately contact us, at any time, without hesitation. In any case, we plan to contact the patient by telephone for a follow-up status report regarding this interventional procedure.  Comments:  No additional relevant information. 5 out of 5 strength bilateral upper extremity: Shoulder abduction, elbow flexion, elbow extension, thumb extension.  Plan of Care  Orders:  Orders Placed This Encounter  Procedures  . Fluoro (C-Arm) (<60 min) (No Report)    Intraoperative interpretation by procedural physician at [facility].    Standing Status:   Standing    Number of Occurrences:   1    Order Specific Question:   Reason for exam:    Answer:   Assistance in needle guidance and placement for procedures requiring needle placement in or near specific anatomical locations not easily accessible without such assistance.    Medications ordered for procedure: Meds ordered this encounter  Medications  . iohexol (OMNIPAQUE) 180 MG/ML injection 10 mL    Must be Myelogram-compatible. If not available, you may substitute with a water-soluble, non-ionic, hypoallergenic, myelogram-compatible radiological contrast medium.  Marland Kitchen lidocaine (XYLOCAINE) 2 % (with pres) injection 400 mg  . sodium chloride flush (NS) 0.9 % injection 1 mL  . ropivacaine (PF) 2 mg/mL (0.2%) (NAROPIN) injection 1 mL  . dexamethasone (DECADRON) injection 10 mg   Medications administered: We administered iohexol, lidocaine, sodium chloride flush, ropivacaine (PF) 2 mg/mL (0.2%), and dexamethasone.  See the medical record for exact dosing, route, and time of administration.  Follow-up plan:   Return in about 4 weeks (around 08/24/2019) for Post Procedure  Evaluation, virtual.     Recent Visits Date Type Provider Dept  06/25/19 Office Visit Edward Jolly, MD [facility]  06/02/19 Office Visit Edward Jolly, MD [facility]  Showing recent visits within past 90 days and meeting all other requirements   Today's Visits Date Type Provider Dept  07/27/19 Procedure visit [name], MD [facility]  Showing today's visits and meeting all other requirements   Future Appointments Date Type Provider Dept  08/27/19 Appointment [name], MD [facility]  Showing future appointments within next 90 days and meeting all other requirements   Disposition: Discharge home  Discharge Date & Time: 07/27/2019; 1245 hrs.   Primary [facility] Physician: [facility] Location: [facility] Note by: [name], MD Date: 07/27/2019; Time: 2:10 PM  Disclaimer:  Medicine is not an exact science. The only guarantee in medicine is that nothing is guaranteed. It is important to note that the decision to proceed with this intervention was based on the information collected from the patient. The Data and conclusions were drawn from the patient's questionnaire, the interview, and the physical examination. Because the information was provided in large part by the patient, it cannot be guaranteed that it has not been purposely or unconsciously manipulated. Every effort has been made to obtain as much relevant data as possible for this evaluation. It is important to note that the conclusions that lead to this procedure are derived in large part from the available data. Always take into account that the treatment will also be dependent on availability of resources and existing treatment guidelines, considered by other Pain Management Practitioners as being common knowledge and practice, at the time of the intervention. For Medico-Legal purposes, it is also important to point out that variation in procedural  techniques and pharmacological choices are the acceptable norm. The indications, contraindications, technique, and results of the above procedure should only be interpreted and judged by a Board-Certified Interventional Pain Specialist with extensive familiarity and expertise in the same exact procedure and technique.

## 2019-07-28 ENCOUNTER — Telehealth: Payer: Self-pay | Admitting: *Deleted

## 2019-07-28 NOTE — Telephone Encounter (Signed)
Post procedure check. No answer. LVM. 

## 2019-08-26 ENCOUNTER — Encounter: Payer: Self-pay | Admitting: Student in an Organized Health Care Education/Training Program

## 2019-08-27 ENCOUNTER — Encounter: Payer: Self-pay | Admitting: Student in an Organized Health Care Education/Training Program

## 2019-08-27 ENCOUNTER — Ambulatory Visit
Payer: No Typology Code available for payment source | Attending: Student in an Organized Health Care Education/Training Program | Admitting: Student in an Organized Health Care Education/Training Program

## 2019-08-27 ENCOUNTER — Other Ambulatory Visit: Payer: Self-pay

## 2019-08-27 DIAGNOSIS — G894 Chronic pain syndrome: Secondary | ICD-10-CM | POA: Diagnosis not present

## 2019-08-27 DIAGNOSIS — M4802 Spinal stenosis, cervical region: Secondary | ICD-10-CM

## 2019-08-27 DIAGNOSIS — M5412 Radiculopathy, cervical region: Secondary | ICD-10-CM | POA: Diagnosis not present

## 2019-08-27 MED ORDER — GABAPENTIN 300 MG PO CAPS: 300.0000 mg | ORAL_CAPSULE | Freq: Three times a day (TID) | ORAL | 2 refills | Status: DC

## 2019-08-27 MED ORDER — DICLOFENAC SODIUM 75 MG PO TBEC: 75.0000 mg | DELAYED_RELEASE_TABLET | Freq: Two times a day (BID) | ORAL | 2 refills | Status: DC | PRN

## 2019-08-27 NOTE — Progress Notes (Signed)
Patient: Jordan Welch  Service Category: E/M  Provider: [name], MD  DOB: 06-10-1961  DOS: 08/27/2019  Location: Office  MRN: [id number]  Setting: Ambulatory outpatient  Referring Provider: [facility]  Type: Established Patient  Specialty: Interventional Pain Management  PCP: [facility]  Location: Home  Delivery: TeleHealth     Virtual Encounter - Pain Management PROVIDER NOTE: Information contained herein reflects review and annotations entered in association with encounter. Interpretation of such information and data should be left to medically-trained personnel. Information provided to patient can be located elsewhere in the medical record under "Patient Instructions". Document created using STT-dictation technology, any transcriptional errors that may result from process are unintentional.    Contact & Pharmacy Preferred: [phone] Home: [phone] (home) Mobile: There is no such number on file (mobile). E-mail: No e-mail address on record  [place], [place] [address] Phone: [phone] Fax: [phone]  [place], [address] [place] [address] Phone: [phone] Fax: [phone]   Pre-screening  Jordan Welch offered "in-person" vs "virtual" encounter. He indicated preferring virtual for this encounter.   Reason COVID-19*  Social distancing based on CDC and AMA recommendations.   I contacted Jordan Welch on 08/27/2019 via telephone.      I clearly identified myself as [name], MD. I verified that I was speaking with the correct person using two identifiers (Name: Jordan Welch, and date of birth: [birth year]).  Consent I sought verbal advanced consent from Jordan Welch for virtual visit interactions. I informed Jordan Welch of possible security and privacy concerns, risks, and limitations associated with providing "not-in-person"  medical evaluation and management services. I also informed Jordan Welch of the availability of "in-person" appointments. Finally, I informed him that there would be a charge for the virtual visit and that he could be  personally, fully or partially, financially responsible for it. Jordan Welch expressed understanding and agreed to proceed.   Historic Elements   Jordan Welch is a 59 y.o. year old, male patient evaluated today after his last contact with our practice on 07/28/2019. Jordan Welch  has a past medical history of Anxiety, High cholesterol, and Reflux. He also  has a past surgical history that includes Foot surgery (Right). Jordan Welch has a current medication list which includes the following prescription(s): atorvastatin, citalopram, famotidine, gabapentin, hydroxyzine, metformin, trazodone, diclofenac, and omeprazole. He  reports that he has never smoked. He has never used smokeless tobacco. He reports current alcohol use. He reports that he does not use drugs. Jordan Welch has No Known Allergies.   HPI  Today, he is being contacted for a post-procedure assessment.   Evaluation of last interventional procedure  07/27/2019 Procedure:   LEFT C7-T1 ESI #1  Sedation: Please see nurses note for DOS. When no sedatives are used, the analgesic levels obtained are directly associated to the effectiveness of the local anesthetics. However, when sedation is provided, the level of analgesia obtained during the initial 1 hour following the intervention, is believed to be the result of a combination of factors. These factors may include, but are not limited to: 1. The effectiveness of the local anesthetics used. 2. The effects of the analgesic(s) and/or anxiolytic(s) used. 3. The degree of discomfort experienced by the patient at the time of the procedure. 4. The patients ability and reliability in recalling and recording the events. 5. The  presence and influence of possible secondary gains and/or psychosocial  factors. Reported result: Relief experienced during the 1st hour after the procedure: 100 % (Ultra-Short Term Relief)            Interpretative annotation: Clinically appropriate result. Analgesia during this period is likely to be Local Anesthetic and/or IV Sedative (Analgesic/Anxiolytic) related.          Effects of local anesthetic: The analgesic effects attained during this period are directly associated to the localized infiltration of local anesthetics and therefore cary significant diagnostic value as to the etiological location, or anatomical origin, of the pain. Expected duration of relief is directly dependent on the pharmacodynamics of the local anesthetic used. Long-acting (4-6 hours) anesthetics used.  Reported result: Relief during the next 4 to 6 hour after the procedure: 100 % (Short-Term Relief)            Interpretative annotation: Clinically appropriate result. Analgesia during this period is likely to be Local Anesthetic-related.          Long-term benefit: Defined as the period of time past the expected duration of local anesthetics (1 hour for short-acting and 4-6 hours for long-acting). With the possible exception of prolonged sympathetic blockade from the local anesthetics, benefits during this period are typically attributed to, or associated with, other factors such as analgesic sensory neuropraxia, antiinflammatory effects, or beneficial biochemical changes provided by agents other than the local anesthetics.  Reported result: Extended relief following procedure: 50 %(continues to have limited ROM in neck and some popping and cracking, worse when he looks to the right) (Long-Term Relief)            Interpretative annotation: Clinically appropriate result. Good relief. No permanent benefit expected. Inflammation plays a part in the etiology to the pain.           Repeat C-ESI #2 Laboratory Chemistry Profile   Renal No results found for: BUN, CREATININE, LABCREA, BCR, GFR,  GFRAA, GFRNONAA, LABVMA, EPIRU, [id number], NOREPRU, NOREPI24HUR, DOPARU, [id number]  Hepatic No results found for: AST, ALT, ALBUMIN, ALKPHOS, HCVAB, AMYLASE, LIPASE, AMMONIA  Electrolytes No results found for: NA, K, CL, CALCIUM, MG, PHOS  Bone No results found for: VD25OH, [id number], KZ9935TS1, [id number], 25OHVITD1, 25OHVITD2, 25OHVITD3, TESTOFREE, TESTOSTERONE  Coagulation No results found for: INR, LABPROT, APTT, PLT, DDIMER, LABHEMA, VITAMINK1, AT3  Cardiovascular No results found for: BNP, CKTOTAL, CKMB, TROPONINI, HGB, HCT, LABVMA  Inflammation (CRP: Acute Phase) (ESR: Chronic Phase) No results found for: CRP, ESRSEDRATE, LATICACIDVEN    Note: Above Lab results reviewed.   Assessment  The primary encounter diagnosis was Cervical radicular pain. 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  Plan of Care   I have changed Jordan Welch's gabapentin. I am also having him start on diclofenac. Additionally, I am having him maintain his citalopram, traZODone, atorvastatin, omeprazole, metFORMIN, famotidine, and hydrOXYzine.  Pharmacotherapy (Medications Ordered): Meds ordered this encounter  Medications  . gabapentin (NEURONTIN) 300 MG capsule    Sig: Take 1 capsule (300 mg total) by mouth 3 (three) times daily.    Dispense:  90 capsule    Refill:  2  . diclofenac (VOLTAREN) 75 MG EC tablet    Sig: Take 1 tablet (75 mg total) by mouth 2 (two) times daily as needed.    Dispense:  60 tablet    Refill:  2   Orders:  Orders Placed This Encounter  Procedures  .  Cervical Epidural Injection    Level(s): C7-T1 Laterality: TBD Purpose: Diagnostic/Therapeutic Indication(s): Radiculitis and cervicalgia associater with cervical degenerative disc disease.    Standing Status:   Future    Standing Expiration Date:   09/24/2019    Scheduling Instructions:     Procedure: Cervical Epidural Steroid  Injection/Block     Sedation: without     Timeframe: As soon as schedule allows    Order Specific Question:   Where will this procedure be performed?    Answer:   [facility]    Comments:   Corina Stacy   Follow-up plan:   Return in about 2 weeks (around 09/10/2019) for C-ESI , without sedation.       Recent Visits Date Type Provider Dept  07/27/19 Procedure visit [name], MD [facility]  06/25/19 Office Visit [name], MD [facility]  06/02/19 Office Visit [name], MD [facility]  Showing recent visits within past 90 days and meeting all other requirements   Today's Visits Date Type Provider Dept  08/27/19 Office Visit [name], MD [facility]  Showing today's visits and meeting all other requirements   Future Appointments No visits were found meeting these conditions.  Showing future appointments within next 90 days and meeting all other requirements   I discussed the assessment and treatment plan with the patient. The patient was provided an opportunity to ask questions and all were answered. The patient agreed with the plan and demonstrated an understanding of the instructions.  Patient advised to call back or seek an in-person evaluation if the symptoms or condition worsens.  Duration of encounter: 25 minutes.  Note by: [name], MD Date: 08/27/2019; Time: 12:03 PM

## 2019-09-09 ENCOUNTER — Other Ambulatory Visit: Payer: Self-pay

## 2019-09-09 ENCOUNTER — Encounter: Payer: Self-pay | Admitting: Student in an Organized Health Care Education/Training Program

## 2019-09-09 ENCOUNTER — Ambulatory Visit (HOSPITAL_BASED_OUTPATIENT_CLINIC_OR_DEPARTMENT_OTHER)
Payer: No Typology Code available for payment source | Admitting: Student in an Organized Health Care Education/Training Program

## 2019-09-09 ENCOUNTER — Ambulatory Visit
Admission: RE | Admit: 2019-09-09 | Discharge: 2019-09-09 | Disposition: A | Discharge status: Home / Self Care | Payer: No Typology Code available for payment source | Source: Ambulatory Visit | Attending: Student in an Organized Health Care Education/Training Program | Admitting: Student in an Organized Health Care Education/Training Program

## 2019-09-09 VITALS — BP 145/83 | HR 76 | Temp 98.3°F | Resp 16 | Ht 71.0 in | Wt 240.0 lb

## 2019-09-09 DIAGNOSIS — M5412 Radiculopathy, cervical region: Secondary | ICD-10-CM | POA: Insufficient documentation

## 2019-09-09 MED ORDER — DEXAMETHASONE SODIUM PHOSPHATE 10 MG/ML IJ SOLN
INTRAMUSCULAR | Status: AC
  Filled 2019-09-09: qty 1

## 2019-09-09 MED ORDER — IOHEXOL 180 MG/ML  SOLN
10.0000 mL | Freq: Once | INTRAMUSCULAR | Status: AC
  Administered 2019-09-09: 10 mL via EPIDURAL
  Filled 2019-09-09: qty 20

## 2019-09-09 MED ORDER — TIZANIDINE HCL 4 MG PO TABS: 4.0000 mg | ORAL_TABLET | Freq: Two times a day (BID) | ORAL | 0 refills | Status: AC | PRN

## 2019-09-09 MED ORDER — LIDOCAINE HCL 2 % IJ SOLN
20.0000 mL | Freq: Once | INTRAMUSCULAR | Status: AC
  Administered 2019-09-09: 400 mg

## 2019-09-09 MED ORDER — ROPIVACAINE HCL 2 MG/ML IJ SOLN
INTRAMUSCULAR | Status: AC
  Filled 2019-09-09: qty 10

## 2019-09-09 MED ORDER — SODIUM CHLORIDE (PF) 0.9 % IJ SOLN
INTRAMUSCULAR | Status: AC
  Filled 2019-09-09: qty 10

## 2019-09-09 MED ORDER — LIDOCAINE HCL 2 % IJ SOLN
INTRAMUSCULAR | Status: AC
  Filled 2019-09-09: qty 20

## 2019-09-09 MED ORDER — DEXAMETHASONE SODIUM PHOSPHATE 10 MG/ML IJ SOLN
10.0000 mg | Freq: Once | INTRAMUSCULAR | Status: AC
  Administered 2019-09-09: 10 mg

## 2019-09-09 MED ORDER — ROPIVACAINE HCL 2 MG/ML IJ SOLN
2.0000 mL | Freq: Once | INTRAMUSCULAR | Status: AC
  Administered 2019-09-09: 2 mL via EPIDURAL

## 2019-09-09 MED ORDER — SODIUM CHLORIDE 0.9% FLUSH
2.0000 mL | Freq: Once | INTRAVENOUS | Status: AC
  Administered 2019-09-09: 2 mL

## 2019-09-09 NOTE — Progress Notes (Signed)
Safety precautions to be maintained throughout the outpatient stay will include: orient to surroundings, keep bed in low position, maintain call bell within reach at all times, provide assistance with transfer out of bed and ambulation.  

## 2019-09-09 NOTE — Patient Instructions (Signed)
____________________________________________________________________________________________  General Risks and Possible Complications  Patient Responsibilities: It is important that you read this as it is part of your informed consent. It is our duty to inform you of the risks and possible complications associated with treatments offered to you. It is your responsibility as a patient to read this and to ask questions about anything that is not clear or that you believe was not covered in this document.  Patient's Rights: You have the right to refuse treatment. You also have the right to change your mind, even after initially having agreed to have the treatment done. However, under this last option, if you wait until the last second to change your mind, you may be charged for the materials used up to that point.  Introduction: Medicine is not an exact science. Everything in Medicine, including the lack of treatment(s), carries the potential for danger, harm, or loss (which is by definition: Risk). In Medicine, a complication is a secondary problem, condition, or disease that can aggravate an already existing one. All treatments carry the risk of possible complications. The fact that a side effects or complications occurs, does not imply that the treatment was conducted incorrectly. It must be clearly understood that these can happen even when everything is done following the highest safety standards.  No treatment: You can choose not to proceed with the proposed treatment alternative. The "PRO(s)" would include: avoiding the risk of complications associated with the therapy. The "CON(s)" would include: not getting any of the treatment benefits. These benefits fall under one of three categories: diagnostic; therapeutic; and/or palliative. Diagnostic benefits include: getting information which can ultimately lead to improvement of the disease or symptom(s). Therapeutic benefits are those associated with the  successful treatment of the disease. Finally, palliative benefits are those related to the decrease of the primary symptoms, without necessarily curing the condition (example: decreasing the pain from a flare-up of a chronic condition, such as incurable terminal cancer).  General Risks and Complications: These are associated to most interventional treatments. They can occur alone, or in combination. They fall under one of the following six (6) categories: no benefit or worsening of symptoms; bleeding; infection; nerve damage; allergic reactions; and/or death. 1. No benefits or worsening of symptoms: In Medicine there are no guarantees, only probabilities. No healthcare provider can ever guarantee that a medical treatment will work, they can only state the probability that it may. Furthermore, there is always the possibility that the condition may worsen, either directly, or indirectly, as a consequence of the treatment. 2. Bleeding: This is more common if the patient is taking a blood thinner, either prescription or over the counter (example: Goody Powders, Fish oil, Aspirin, Garlic, etc.), or if suffering a condition associated with impaired coagulation (example: Hemophilia, cirrhosis of the liver, low platelet counts, etc.). However, even if you do not have one on these, it can still happen. If you have any of these conditions, or take one of these drugs, make sure to notify your treating physician. 3. Infection: This is more common in patients with a compromised immune system, either due to disease (example: diabetes, cancer, human immunodeficiency virus [HIV], etc.), or due to medications or treatments (example: therapies used to treat cancer and rheumatological diseases). However, even if you do not have one on these, it can still happen. If you have any of these conditions, or take one of these drugs, make sure to notify your treating physician. 4. Nerve Damage: This is more common when the   treatment is  an invasive one, but it can also happen with the use of medications, such as those used in the treatment of cancer. The damage can occur to small secondary nerves, or to large primary ones, such as those in the spinal cord and brain. This damage may be temporary or permanent and it may lead to impairments that can range from temporary numbness to permanent paralysis and/or brain death. 5. Allergic Reactions: Any time a substance or material comes in contact with our body, there is the possibility of an allergic reaction. These can range from a mild skin rash (contact dermatitis) to a severe systemic reaction (anaphylactic reaction), which can result in death. 6. Death: In general, any medical intervention can result in death, most of the time due to an unforeseen complication. ____________________________________________________________________________________________  Pain Management Discharge Instructions  General Discharge Instructions :  If you need to reach your doctor call: Monday-Friday 8:00 am - 4:00 pm at [phone] or toll free [phone].  After clinic hours [phone] to have operator reach doctor.  Bring all of your medication bottles to all your appointments in the pain clinic.  To cancel or reschedule your appointment with Pain Management please remember to call 24 hours in advance to avoid a fee.  Refer to the educational materials which you have been given on: General Risks, I had my Procedure. Discharge Instructions, Post Sedation.  Post Procedure Instructions:  The drugs you were given will stay in your system until tomorrow, so for the next 24 hours you should not drive, make any legal decisions or drink any alcoholic beverages.  You may eat anything you prefer, but it is better to start with liquids then soups and crackers, and gradually work up to solid foods.  Please notify your doctor immediately if you have any unusual bleeding, trouble breathing or pain that  is not related to your normal pain.  Depending on the type of procedure that was done, some parts of your body may feel week and/or numb.  This usually clears up by tonight or the next day.  Walk with the use of an assistive device or accompanied by an adult for the 24 hours.  You may use ice on the affected area for the first 24 hours.  Put ice in a Ziploc bag and cover with a towel and place against area 15 minutes on 15 minutes off.  You may switch to heat after 24 hours.Epidural Steroid Injection Patient Information  Description: The epidural space surrounds the nerves as they exit the spinal cord.  In some patients, the nerves can be compressed and inflamed by a bulging disc or a tight spinal canal (spinal stenosis).  By injecting steroids into the epidural space, we can bring irritated nerves into direct contact with a potentially helpful medication.  These steroids act directly on the irritated nerves and can reduce swelling and inflammation which often leads to decreased pain.  Epidural steroids may be injected anywhere along the spine and from the neck to the low back depending upon the location of your pain.   After numbing the skin with local anesthetic (like Novocaine), a small needle is passed into the epidural space slowly.  You may experience a sensation of pressure while this is being done.  The entire block usually last less than 10 minutes.  Conditions which may be treated by epidural steroids:   Low back and leg pain  Neck and arm pain  Spinal stenosis  Post-laminectomy syndrome  Herpes zoster (  shingles) pain  Pain from compression fractures  Preparation for the injection:  1. Do not eat any solid food or dairy products within 8 hours of your appointment.  2. You may drink clear liquids up to 3 hours before appointment.  Clear liquids include water, black coffee, juice or soda.  No milk or cream please. 3. You may take your regular medication, including pain  medications, with a sip of water before your appointment  Diabetics should hold regular insulin (if taken separately) and take 1/2 normal NPH dos the morning of the procedure.  Carry some sugar containing items with you to your appointment. 4. A driver must accompany you and be prepared to drive you home after your procedure.  5. Bring all your current medications with your. 6. An IV may be inserted and sedation may be given at the discretion of the physician.   7. A blood pressure cuff, EKG and other monitors will often be applied during the procedure.  Some patients may need to have extra oxygen administered for a short period. 8. You will be asked to provide medical information, including your allergies, prior to the procedure.  We must know immediately if you are taking blood thinners (like Coumadin/Warfarin)  Or if you are allergic to IV iodine contrast (dye). We must know if you could possible be pregnant.  Possible side-effects:  Bleeding from needle site  Infection (rare, may require surgery)  Nerve injury (rare)  Numbness & tingling (temporary)  Difficulty urinating (rare, temporary)  Spinal headache ( a headache worse with upright posture)  Light -headedness (temporary)  Pain at injection site (several days)  Decreased blood pressure (temporary)  Weakness in arm/leg (temporary)  Pressure sensation in back/neck (temporary)  Call if you experience:  Fever/chills associated with headache or increased back/neck pain.  Headache worsened by an upright position.  New onset weakness or numbness of an extremity below the injection site  Hives or difficulty breathing (go to the emergency room)  Inflammation or drainage at the infection site  Severe back/neck pain  Any new symptoms which are concerning to you  Please note:  Although the local anesthetic injected can often make your back or neck feel good for several hours after the injection, the pain will likely  return.  It takes 3-7 days for steroids to work in the epidural space.  You may not notice any pain relief for at least that one week.  If effective, we will often do a series of three injections spaced 3-6 weeks apart to maximally decrease your pain.  After the initial series, we generally will wait several months before considering a repeat injection of the same type.  If you have any questions, please call [phone] [facility]

## 2019-09-09 NOTE — Progress Notes (Signed)
Patient's Name: Jordan Welch  MRN: [id number]  Referring Provider: [facility]  DOB: 1961-06-27  PCP: [facility]  DOS: 09/09/2019  Note by: [name], MD  Service setting: Ambulatory outpatient  Specialty: Interventional Pain Management  Patient type: Established  Location: [facility] (AMB) Pain Management Facility  Visit type: Interventional Procedure   Primary Reason for Visit: Interventional Pain Management Treatment. CC: Neck Pain ("C7 area")  Procedure:          Anesthesia, Analgesia, Anxiolysis:  Type: Diagnostic, Inter-Laminar, Cervical Epidural Steroid Injection  #2  Region: Posterior Cervico-thoracic Region Level: T1-2 Laterality: Left-Sided Paramedial  Type: Local Anesthesia  Local Anesthetic: Lidocaine 1-2%  Position: Prone with head of the table was raised to facilitate breathing.   Indications: 1. Cervical radicular pain    Pain Score: Pre-procedure: 4 /10 Post-procedure: 4 /10   Pre-op Assessment:  Jordan Welch is a 59 y.o. (year old), male patient, seen today for interventional treatment. He  has a past surgical history that includes Foot surgery (Right). Jordan Welch has a current medication list which includes the following prescription(s): atorvastatin, citalopram, diclofenac, famotidine, gabapentin, hydroxyzine, metformin, omeprazole, trazodone, and tizanidine. His primarily concern today is the Neck Pain ("C7 area")  Initial Vital Signs:  Pulse/HCG Rate: 76ECG Heart Rate: 75 Temp: 98.3 F (36.8 C) Resp: 18 BP: (!) 149/75 SpO2: 98 %  BMI: Estimated body mass index is 33.47 kg/m as calculated from the following:   Height as of this encounter: 5\' 11"  (1.803 m).   Weight as of this encounter: 240 lb (108.9 kg).  Risk Assessment: Allergies: Reviewed. He has No Known Allergies.  Allergy Precautions: None required Coagulopathies: Reviewed. None identified.  Blood-thinner therapy: None at this time Active Infection(s): Reviewed. None identified. Jordan Welch is  afebrile  Site Confirmation: Jordan Welch was asked to confirm the procedure and laterality before marking the site Procedure checklist: Completed Consent: Before the procedure and under the influence of no sedative(s), amnesic(s), or anxiolytics, the patient was informed of the treatment options, risks and possible complications. To fulfill our ethical and legal obligations, as recommended by the American Medical Association's Code of Ethics, I have informed the patient of my clinical impression; the nature and purpose of the treatment or procedure; the risks, benefits, and possible complications of the intervention; the alternatives, including doing nothing; the risk(s) and benefit(s) of the alternative treatment(s) or procedure(s); and the risk(s) and benefit(s) of doing nothing. The patient was provided information about the general risks and possible complications associated with the procedure. These may include, but are not limited to: failure to achieve desired goals, infection, bleeding, organ or nerve damage, allergic reactions, paralysis, and death. In addition, the patient was informed of those risks and complications associated to Spine-related procedures, such as failure to decrease pain; infection (i.e.: Meningitis, epidural or intraspinal abscess); bleeding (i.e.: epidural hematoma, subarachnoid hemorrhage, or any other type of intraspinal or peri-dural bleeding); organ or nerve damage (i.e.: Any type of peripheral nerve, nerve root, or spinal cord injury) with subsequent damage to sensory, motor, and/or autonomic systems, resulting in permanent pain, numbness, and/or weakness of one or several areas of the body; allergic reactions; (i.e.: anaphylactic reaction); and/or death. Furthermore, the patient was informed of those risks and complications associated with the medications. These include, but are not limited to: allergic reactions (i.e.: anaphylactic or anaphylactoid reaction(s)); adrenal axis  suppression; blood sugar elevation that in diabetics may result in ketoacidosis or comma; water retention that in patients with history  of congestive heart failure may result in shortness of breath, pulmonary edema, and decompensation with resultant heart failure; weight gain; swelling or edema; medication-induced neural toxicity; particulate matter embolism and blood vessel occlusion with resultant organ, and/or nervous system infarction; and/or aseptic necrosis of one or more joints. Finally, the patient was informed that Medicine is not an exact science; therefore, there is also the possibility of unforeseen or unpredictable risks and/or possible complications that may result in a catastrophic outcome. The patient indicated having understood very clearly. We have given the patient no guarantees and we have made no promises. Enough time was given to the patient to ask questions, all of which were answered to the patient's satisfaction. Jordan Welch has indicated that he wanted to continue with the procedure. Attestation: I, the ordering provider, attest that I have discussed with the patient the benefits, risks, side-effects, alternatives, likelihood of achieving goals, and potential problems during recovery for the procedure that I have provided informed consent. Date  Time: 09/09/2019 11:25 AM  Pre-Procedure Preparation:  Monitoring: As per clinic protocol. Respiration, ETCO2, SpO2, BP, heart rate and rhythm monitor placed and checked for adequate function Safety Precautions: Patient was assessed for positional comfort and pressure points before starting the procedure. Time-out: I initiated and conducted the "Time-out" before starting the procedure, as per protocol. The patient was asked to participate by confirming the accuracy of the "Time Out" information. Verification of the correct person, site, and procedure were performed and confirmed by me, the nursing staff, and the patient. "Time-out" conducted as  per Joint Commission's Universal Protocol (UP.01.01.01). Time: 1210  Description of Procedure:          Target Area: For Epidural Steroid injections the target is the interlaminar space, initially targeting the lower border of the superior vertebral body lamina. Approach: Paramedial approach. Area Prepped: Entire PosteriorCervical Region Prepping solution: DuraPrep (Iodine Povacrylex [0.7% available iodine] and Isopropyl Alcohol, 74% w/w) Safety Precautions: Aspiration looking for blood return was conducted prior to all injections. At no point did we inject any substances, as a needle was being advanced. No attempts were made at seeking any paresthesias. Safe injection practices and needle disposal techniques used. Medications properly checked for expiration dates. SDV (single dose vial) medications used. Description of the Procedure: Protocol guidelines were followed. The procedure needle was introduced through the skin, ipsilateral to the reported pain, and advanced to the target area. Bone was contacted and the needle walked caudad, until the lamina was cleared. The epidural space was identified using "loss-of-resistance technique" with 2-3 ml of PF-NaCl (0.9% NSS), in a 5cc LOR glass syringe. Vitals:   09/09/19 1208 09/09/19 1215 09/09/19 1220 09/09/19 1226  BP: (!) 149/79 139/90 138/75 (!) 145/83  Pulse:      Resp: 18 16 16 16   Temp:      TempSrc:      SpO2: 99% 97% 98% 97%  Weight:      Height:        Start Time: 1210 hrs. End Time: 1225 hrs. Materials:  Needle(s) Type: Epidural needle Gauge: 22G Length: 3.5-in Medication(s): Please see orders for medications and dosing details. 5 cc solution made of 2 cc of preservative-free saline, 2 cc of 0.2% ropivacaine, 1 cc of Decadron 10 mg/cc.  Imaging Guidance (Spinal):          Type of Imaging Technique: Fluoroscopy Guidance (Spinal) Indication(s): Assistance in needle guidance and placement for procedures requiring needle placement  in or near specific anatomical locations not easily  accessible without such assistance. Exposure Time: Please see nurses notes. Contrast: Before injecting any contrast, we confirmed that the patient did not have an allergy to iodine, shellfish, or radiological contrast. Once satisfactory needle placement was completed at the desired level, radiological contrast was injected. Contrast injected under live fluoroscopy. No contrast complications. See chart for type and volume of contrast used. Fluoroscopic Guidance: I was personally present during the use of fluoroscopy. "Tunnel Vision Technique" used to obtain the best possible view of the target area. Parallax error corrected before commencing the procedure. "Direction-depth-direction" technique used to introduce the needle under continuous pulsed fluoroscopy. Once target was reached, antero-posterior, oblique, and lateral fluoroscopic projection used confirm needle placement in all planes. Images permanently stored in EMR. Interpretation: I personally interpreted the imaging intraoperatively. Adequate needle placement confirmed in multiple planes. Appropriate spread of contrast into desired area was observed. No evidence of afferent or efferent intravascular uptake. No intrathecal or subarachnoid spread observed. Permanent images saved into the patient's record.  Antibiotic Prophylaxis:   Anti-infectives (From admission, onward)   None     Indication(s): None identified  Post-operative Assessment:  Post-procedure Vital Signs:  Pulse/HCG Rate: 7671 Temp: 98.3 F (36.8 C) Resp: 16 BP: (!) 145/83 SpO2: 97 %  EBL: None  Complications: No immediate post-treatment complications observed by team, or reported by patient.  Note: The patient tolerated the entire procedure well. A repeat set of vitals were taken after the procedure and the patient was kept under observation following institutional policy, for this type of procedure. Post-procedural  neurological assessment was performed, showing return to baseline, prior to discharge. The patient was provided with post-procedure discharge instructions, including a section on how to identify potential problems. Should any problems arise concerning this procedure, the patient was given instructions to immediately contact us, at any time, without hesitation. In any case, we plan to contact the patient by telephone for a follow-up status report regarding this interventional procedure.  Comments:  No additional relevant information. 5 out of 5 Welch bilateral upper extremity: Shoulder abduction, elbow flexion, elbow extension, thumb extension.  Plan of Care  Orders:  Orders Placed This Encounter  Procedures  . DG PAIN CLINIC C-ARM 1-60 MIN NO REPORT    Intraoperative interpretation by procedural physician at [facility].    Standing Status:   Standing    Number of Occurrences:   1    Order Specific Question:   Reason for exam:    Answer:   Assistance in needle guidance and placement for procedures requiring needle placement in or near specific anatomical locations not easily accessible without such assistance.    Medications ordered for procedure: Meds ordered this encounter  Medications  . iohexol (OMNIPAQUE) 180 MG/ML injection 10 mL    Must be Myelogram-compatible. If not available, you may substitute with a water-soluble, non-ionic, hypoallergenic, myelogram-compatible radiological contrast medium.  Marland Kitchen lidocaine (XYLOCAINE) 2 % (with pres) injection 400 mg  . ropivacaine (PF) 2 mg/mL (0.2%) (NAROPIN) injection 2 mL  . sodium chloride flush (NS) 0.9 % injection 2 mL  . dexamethasone (DECADRON) injection 10 mg  . tiZANidine (ZANAFLEX) 4 MG tablet    Sig: Take 1 tablet (4 mg total) by mouth 2 (two) times daily as needed for muscle spasms.    Dispense:  60 tablet    Refill:  0    Do not place this medication, or any other prescription from our practice, on "Automatic  Refill". Patient may have prescription filled one day  early if pharmacy is closed on scheduled refill date.   Medications administered: We administered iohexol, lidocaine, ropivacaine (PF) 2 mg/mL (0.2%), sodium chloride flush, and dexamethasone.  See the medical record for exact dosing, route, and time of administration.  Follow-up plan:   Return in about 5 weeks (around 10/14/2019) for Post Procedure Evaluation, virtual.     Recent Visits Date Type Provider Dept  08/27/19 Office Visit Edward Jolly, MD [facility]  07/27/19 Procedure visit Edward Jolly, MD [facility]  06/25/19 Office Visit Edward Jolly, MD [facility]  Showing recent visits within past 90 days and meeting all other requirements   Today's Visits Date Type Provider Dept  09/09/19 Procedure visit Edward Jolly, MD [facility]  Showing today's visits and meeting all other requirements   Future Appointments No visits were found meeting these conditions.  Showing future appointments within next 90 days and meeting all other requirements   Disposition: Discharge home  Discharge Date & Time: 09/09/2019; 1235 hrs.   Primary [facility] Physician: [facility] Note by: Edward Jolly, MD Date: 09/09/2019; Time: 12:34 PM  Disclaimer:  Medicine is not an exact science. The only guarantee in medicine is that nothing is guaranteed. It is important to note that the decision to proceed with this intervention was based on the information collected from the patient. The Data and conclusions were drawn from the patient's questionnaire, the interview, and the physical examination. Because the information was provided in large part by the patient, it cannot be guaranteed that it has not been purposely or unconsciously manipulated. Every effort has been made to obtain as much relevant data as possible for this evaluation. It is important to note  that the conclusions that lead to this procedure are derived in large part from the available data. Always take into account that the treatment will also be dependent on availability of resources and existing treatment guidelines, considered by other Pain Management Practitioners as being common knowledge and practice, at the time of the intervention. For Medico-Legal purposes, it is also important to point out that variation in procedural techniques and pharmacological choices are the acceptable norm. The indications, contraindications, technique, and results of the above procedure should only be interpreted and judged by a Board-Certified Interventional Pain Specialist with extensive familiarity and expertise in the same exact procedure and technique.

## 2019-09-10 ENCOUNTER — Telehealth: Payer: Self-pay

## 2019-09-10 NOTE — Telephone Encounter (Signed)
No answer. Let message to call if needed.

## 2019-09-15 ENCOUNTER — Telehealth: Payer: Self-pay | Admitting: *Deleted

## 2019-09-15 NOTE — Telephone Encounter (Signed)
Attempted to call again, no answer

## 2019-09-15 NOTE — Telephone Encounter (Signed)
Attempted to call patient, message left. 

## 2019-09-16 NOTE — Telephone Encounter (Signed)
Attempted to call patient, message left. 

## 2019-09-18 ENCOUNTER — Telehealth: Payer: Self-pay

## 2019-09-18 ENCOUNTER — Other Ambulatory Visit: Payer: Self-pay

## 2019-09-18 DIAGNOSIS — G894 Chronic pain syndrome: Secondary | ICD-10-CM

## 2019-09-18 DIAGNOSIS — M5412 Radiculopathy, cervical region: Secondary | ICD-10-CM

## 2019-09-18 MED ORDER — GABAPENTIN 300 MG PO CAPS: 300.0000 mg | ORAL_CAPSULE | Freq: Three times a day (TID) | ORAL | 2 refills | Status: DC

## 2019-09-18 MED ORDER — DICLOFENAC SODIUM 75 MG PO TBEC: 75.0000 mg | DELAYED_RELEASE_TABLET | Freq: Two times a day (BID) | ORAL | 2 refills | Status: DC | PRN

## 2019-09-18 NOTE — Telephone Encounter (Signed)
Dr Cherylann Ratel, Patient did not receive the script for Voltaren and Gabapentin.  [facility]  I [facility] the correct one in the pharmacy.  It is [facility] [place].  He has been out of Gabapentin for a week.  Could you please resend these to the [place] in [place]?  Thank you

## 2019-09-18 NOTE — Telephone Encounter (Signed)
Medications sent to [facility].  Patient notified.

## 2019-10-13 ENCOUNTER — Encounter: Payer: Self-pay | Admitting: Student in an Organized Health Care Education/Training Program

## 2019-10-13 NOTE — Progress Notes (Signed)
Pain relief after procedure (treated area only): (Questions asked to patient) 1. Starting about 15 minutes after the procedure, and "while the area was still numb" (from the local anesthetics), were you having any of your usual pain "in that area" (the treated area)?  (NOTE: NOT including the discomfort from the needle sticks.) First 1 hour:90 % better. First 4-6 hours: 90 % better. 2. How long did the numbness from the local anesthetics last? (More than 6 hours?) Duration: 8 hours.  3. How much better is your pain now, when compared to before the procedure? Current benefit: 80 % better. 4. Can you move better now? Improvement in ROM (Range of Motion): No. 5. Can you do more now? Improvement in function: No. 4. Did you have any problems with the procedure? Side-effects/Complications: Yes.

## 2019-10-15 ENCOUNTER — Ambulatory Visit
Payer: BC Managed Care – PPO | Attending: Student in an Organized Health Care Education/Training Program | Admitting: Student in an Organized Health Care Education/Training Program

## 2019-10-15 ENCOUNTER — Encounter: Payer: Self-pay | Admitting: Student in an Organized Health Care Education/Training Program

## 2019-10-15 ENCOUNTER — Other Ambulatory Visit: Payer: Self-pay

## 2019-10-15 DIAGNOSIS — M5412 Radiculopathy, cervical region: Secondary | ICD-10-CM | POA: Diagnosis not present

## 2019-10-15 DIAGNOSIS — G894 Chronic pain syndrome: Secondary | ICD-10-CM

## 2019-10-15 DIAGNOSIS — M4802 Spinal stenosis, cervical region: Secondary | ICD-10-CM

## 2019-10-15 DIAGNOSIS — M25552 Pain in left hip: Secondary | ICD-10-CM

## 2019-10-15 DIAGNOSIS — M533 Sacrococcygeal disorders, not elsewhere classified: Secondary | ICD-10-CM

## 2019-10-15 DIAGNOSIS — M25551 Pain in right hip: Secondary | ICD-10-CM

## 2019-10-15 DIAGNOSIS — M7918 Myalgia, other site: Secondary | ICD-10-CM

## 2019-10-15 NOTE — Progress Notes (Signed)
Patient: Jordan Welch  Service Category: E/M  Provider: [name], MD  DOB: 1961-02-25  DOS: 10/15/2019  Location: Office  MRN: [id number]  Setting: Ambulatory outpatient  Referring Provider: [facility]  Type: Established Patient  Specialty: Interventional Pain Management  PCP: [facility]  Location: Home  Delivery: TeleHealth     Virtual Encounter - Pain Management PROVIDER NOTE: Information contained herein reflects review and annotations entered in association with encounter. Interpretation of such information and data should be left to medically-trained personnel. Information provided to patient can be located elsewhere in the medical record under "Patient Instructions". Document created using STT-dictation technology, any transcriptional errors that may result from process are unintentional.    Contact & Pharmacy Preferred: [phone] Home: [phone] (home) Mobile: There is no such number on file (mobile). E-mail: No e-mail address on record  [address] [place] [place] [address] Phone: [phone] Fax: [phone]   Pre-screening  Mr. Raska offered "in-person" vs "virtual" encounter. He indicated preferring virtual for this encounter.   Reason COVID-19*  Social distancing based on CDC and AMA recommendations.   I contacted [name] on 10/15/2019 via telephone.      I clearly identified myself as [name], MD. I verified that I was speaking with the correct person using two identifiers (Name: Fount Bahe, and date of birth: 1961/02/20).  This visit was completed via telephone due to the restrictions of the COVID-19 pandemic. All issues as above were discussed and addressed but no physical exam was performed. If it was felt that the patient should be evaluated in the office, they were directed there. The patient verbally consented to this visit. Patient was unable to complete an audio/visual visit due to  Technical difficulties and/or Lack of internet. Due to the catastrophic nature of the COVID-19 pandemic, this visit was done through audio contact only.  Location of the patient: home address (see Epic for details)  Location of the provider: office     Consent I sought verbal advanced consent from [name] for virtual visit interactions. I informed Mr. Haverstock of possible security and privacy concerns, risks, and limitations associated with providing "not-in-person" medical evaluation and management services. I also informed Mr. Auman of the availability of "in-person" appointments. Finally, I informed him that there would be a charge for the virtual visit and that he could be  personally, fully or partially, financially responsible for it. Mr. Fazzino expressed understanding and agreed to proceed.   Historic Elements   Mr. Rithvik Orcutt is a 59 y.o. year old, male patient evaluated today after his last contact with our practice on 09/18/2019. Mr. Kreuzer  has a past medical history of Anxiety, High cholesterol, and Reflux. He also  has a past surgical history that includes Foot surgery (Right). Mr. Ron has a current medication list which includes the following prescription(s): atorvastatin, citalopram, diclofenac, gabapentin, hydroxyzine, metformin, pantoprazole, trazodone, famotidine, and omeprazole. He  reports that he has never smoked. He has never used smokeless tobacco. He reports current alcohol use. He reports that he does not use drugs. Mr. Tomerlin has No Known Allergies.   HPI  Today, he is being contacted for a post-procedure assessment.   T1-T2 ESI #2   Pain relief after procedure (treated area only): (Questions asked to patient)  1. Starting about 15 minutes after the procedure, and "while the area was still numb" (from the local anesthetics), were you having any of your usual  pain "in that area" (the treated area)?  (NOTE: NOT including the discomfort from the needle sticks.)  First 1 hour:90 %  better.  First 4-6 hours: 90 % better.  2. How long did the numbness from the local anesthetics last? (More than 6 hours?)  Duration: 8 hours.  3. How much better is your pain now, when compared to before the procedure?  Current benefit: 80 % better.  4. Can you move better now?  Improvement in ROM (Range of Motion): No.  5. Can you do more now?  Improvement in function: No.  4. Did you have any problems with the procedure?  Side-effects/Complications: No   Laboratory Chemistry Profile   Renal No results found for: BUN, CREATININE, LABCREA, BCR, GFR, GFRAA, GFRNONAA, LABVMA, EPIRU, EPINEPH24HUR, NOREPRU, NOREPI24HUR, DOPARU, [id number]  Hepatic No results found for: AST, ALT, ALBUMIN, ALKPHOS, HCVAB, AMYLASE, LIPASE, AMMONIA  Electrolytes No results found for: NA, K, CL, CALCIUM, MG, PHOS  Bone No results found for: VD25OH, [id number], [id number], [id number], 25OHVITD1, 25OHVITD2, 25OHVITD3, TESTOFREE, TESTOSTERONE  Inflammation (CRP: Acute Phase) (ESR: Chronic Phase) No results found for: CRP, ESRSEDRATE, LATICACIDVEN    Note: Above Lab results reviewed.  Assessment  The primary encounter diagnosis was Cervical radicular pain. 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  Plan of Care   Mr. Lowen Barringer has a current medication list which includes the following long-term medication(s): atorvastatin, citalopram, gabapentin, metformin, pantoprazole, trazodone, famotidine, and omeprazole.  1. Cervical radicular pain -PRN C-ESI - Cervical Epidural Injection; Standing  2. 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 Sacroiliac joint pain - DG Si Joints; Future - DG HIP UNILAT W OR W/O PELVIS 2-3 VIEWS LEFT; Future - DG HIP UNILAT W OR W/O PELVIS 2-3 VIEWS RIGHT; Future  5. Bilateral hip pain -Hip Xray as  above  6. Myofascial pain - TRIGGER POINT INJECTION; Future Orders:  Orders Placed This Encounter  Procedures  . Cervical Epidural Injection    Procedure: Cervical Epidural Steroid Injection/Block Purpose: Diagnostic Indication(s): Radiculitis and/or cervicalgia associater with cervical degenerative disc disease.    Standing Status:   Standing    Number of Occurrences:   6    Standing Expiration Date:   04/16/2021    Scheduling Instructions:     Level(s): C7-T1     Laterality: TBD     Sedation: Patient's choice.     Timeframe: PRN    Order Specific Question:   Where will this procedure be performed?    Answer:   [facility]    Comments:   Daymion Nazaire  . TRIGGER POINT INJECTION    Standing Status:   Future    Standing Expiration Date:   11/14/2019    Scheduling Instructions:     Trapezius and occipital TPI    Order Specific Question:   Where will this procedure be performed?    Answer:   [facility]  . DG Si Joints    Standing Status:   Future    Standing Expiration Date:   01/15/2020    Order Specific Question:   Reason for Exam (SYMPTOM  OR DIAGNOSIS REQUIRED)    Answer:   Sacroiliac joint pain    Order Specific Question:   Preferred imaging location?    Answer:   [facility]    Order Specific Question:  Call Results- Best Contact Number?    Answer:   [phone] [facility])  . DG HIP UNILAT W OR W/O PELVIS 2-3 VIEWS LEFT    Standing Status:   Future    Standing Expiration Date:   10/14/2020    Scheduling Instructions:     Please describe any evidence of DJD, such as joint narrowing, asymmetry, cysts, or any anomalies in bone density, production, or erosion.    Order Specific Question:   Reason for Exam (SYMPTOM  OR DIAGNOSIS REQUIRED)    Answer:   Sacroiliac joint pain    Order Specific Question:   Preferred imaging location?    Answer:   [facility]    Order Specific Question:   Call Results- Best Contact Number?    Answer:   [phone] (Pain Clinic facility) (Dr. Dossie Arbour)  . DG HIP UNILAT W OR W/O PELVIS 2-3 VIEWS RIGHT    Standing Status:   Future    Standing Expiration Date:   10/14/2020    Scheduling Instructions:     Please describe any evidence of DJD, such as joint narrowing, asymmetry, cysts, or any anomalies in bone density, production, or erosion.    Order Specific Question:   Reason for Exam (SYMPTOM  OR DIAGNOSIS REQUIRED)    Answer:   Sacroiliac joint pain    Order Specific Question:   Preferred imaging location?    Answer:   [facility]    Order Specific Question:   Call Results- Best Contact Number?    Answer:   [phone] (Pain Clinic facility) (Dr. Dossie Arbour)   Follow-up plan:   Return in about 1 week (around 10/22/2019) for TPI (review xray).     Recent Visits Date Type Provider Dept  09/09/19 Procedure visit [name], MD [facility]  08/27/19 Office Visit [name], MD [facility]  07/27/19 Procedure visit [name], MD [facility]  Showing recent visits within past 90 days and meeting all other requirements   Today's Visits Date Type Provider Dept  10/15/19 Office Visit [name], MD [facility]  Showing today's visits and meeting all other requirements   Future Appointments No visits were found meeting these conditions.  Showing future appointments within next 90 days and meeting all other requirements   I discussed the assessment and treatment plan with the patient. The patient was provided an opportunity to ask questions and all were answered. The patient agreed with the plan and demonstrated an understanding of the instructions.  Patient advised to call back or seek an in-person evaluation if the symptoms or condition worsens.  Duration of encounter: 15 minutes.  Note by: [name], MD Date: 10/15/2019; Time: 11:38 AM

## 2019-11-16 ENCOUNTER — Encounter: Payer: Self-pay | Admitting: Student in an Organized Health Care Education/Training Program

## 2019-11-16 ENCOUNTER — Other Ambulatory Visit: Payer: Self-pay

## 2019-11-16 ENCOUNTER — Ambulatory Visit
Admission: RE | Admit: 2019-11-16 | Discharge: 2019-11-16 | Disposition: A | Discharge status: Home / Self Care | Payer: No Typology Code available for payment source | Source: Ambulatory Visit | Attending: Student in an Organized Health Care Education/Training Program | Admitting: Student in an Organized Health Care Education/Training Program

## 2019-11-16 ENCOUNTER — Ambulatory Visit (HOSPITAL_BASED_OUTPATIENT_CLINIC_OR_DEPARTMENT_OTHER)
Payer: No Typology Code available for payment source | Admitting: Student in an Organized Health Care Education/Training Program

## 2019-11-16 DIAGNOSIS — M533 Sacrococcygeal disorders, not elsewhere classified: Secondary | ICD-10-CM | POA: Diagnosis present

## 2019-11-16 DIAGNOSIS — M7918 Myalgia, other site: Secondary | ICD-10-CM | POA: Insufficient documentation

## 2019-11-16 IMAGING — CR DG SI JOINTS 3+V
3 series · 3 of 3 positions shown · non-contrast
Comparison: Hip series today reported separately.

CLINICAL DATA: 59-year-old male with left side hip pain for 6
months. SI joint pain. No known injury.

EXAM:
BILATERAL SACROILIAC JOINTS - 3+ VIEW

[si joints ap]
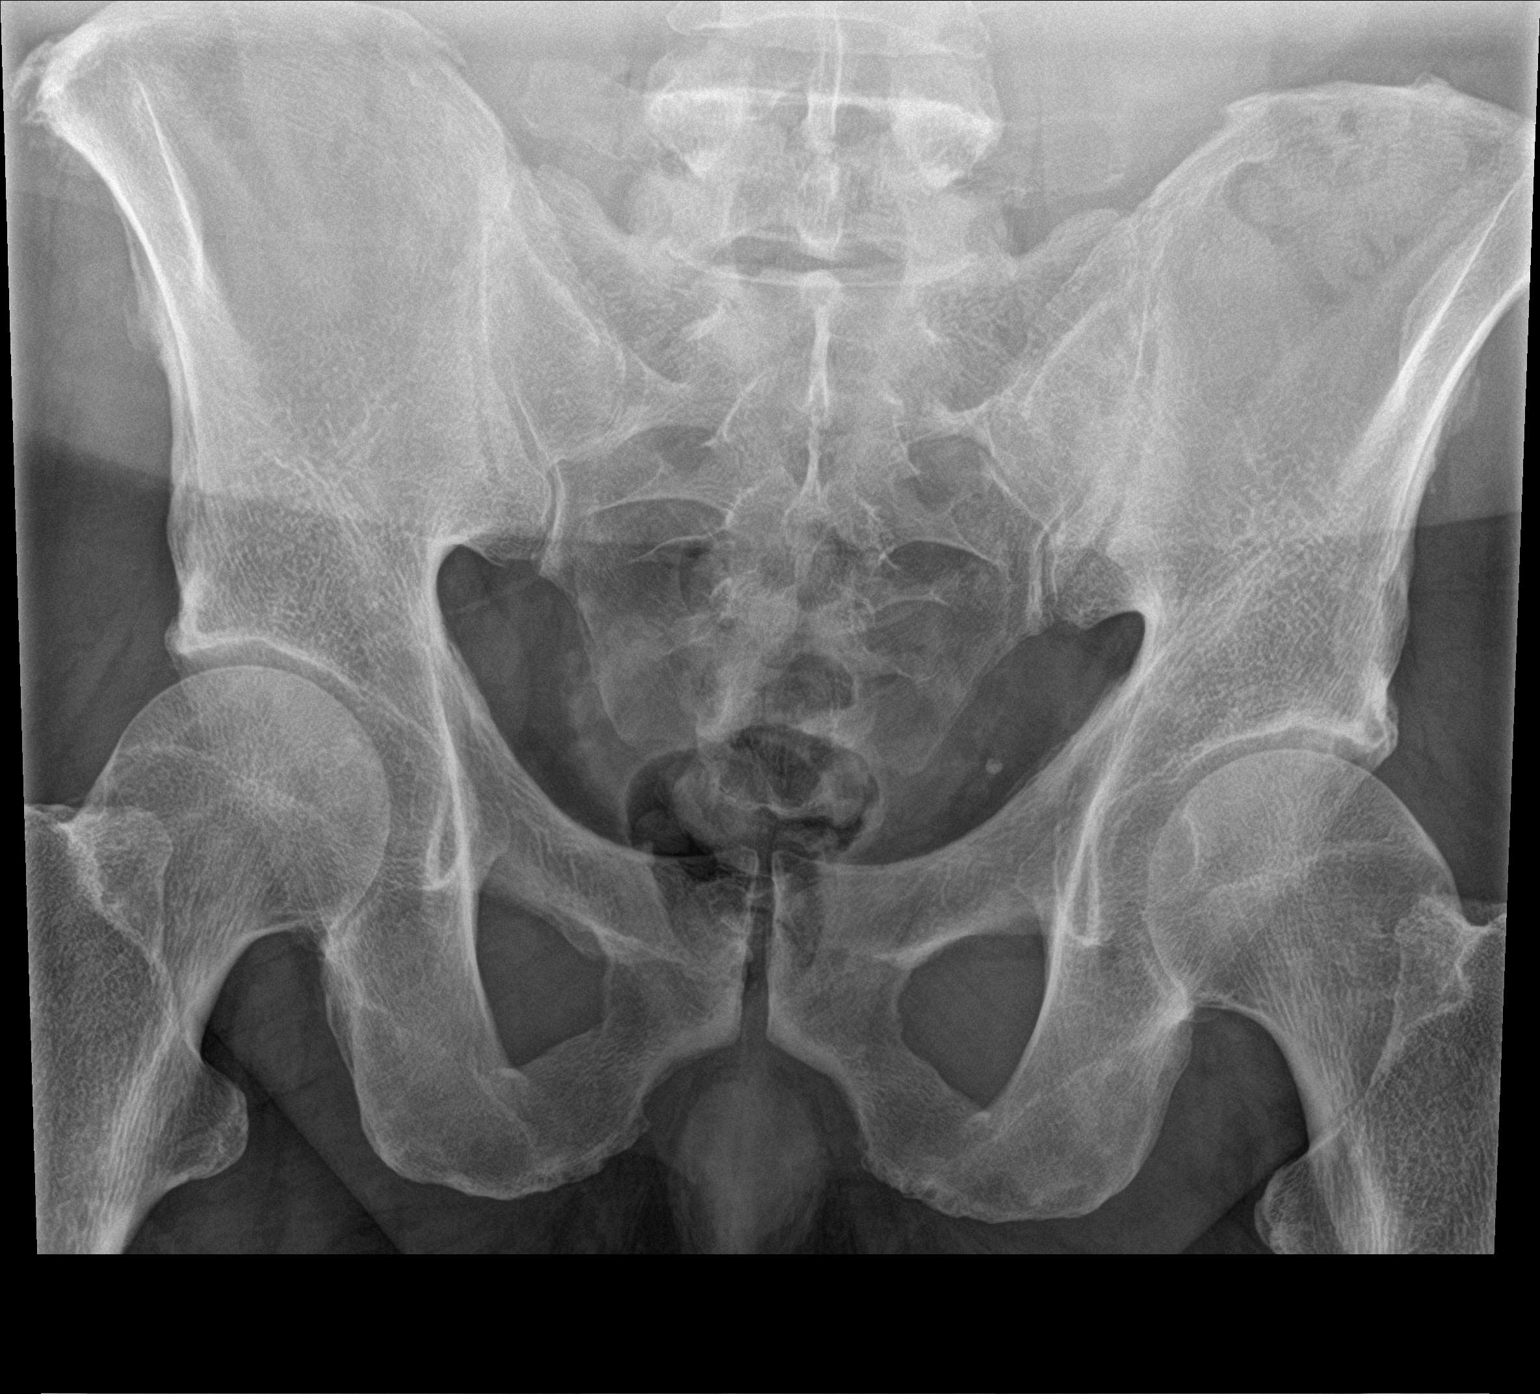

[si joints obl (1 of 2)]
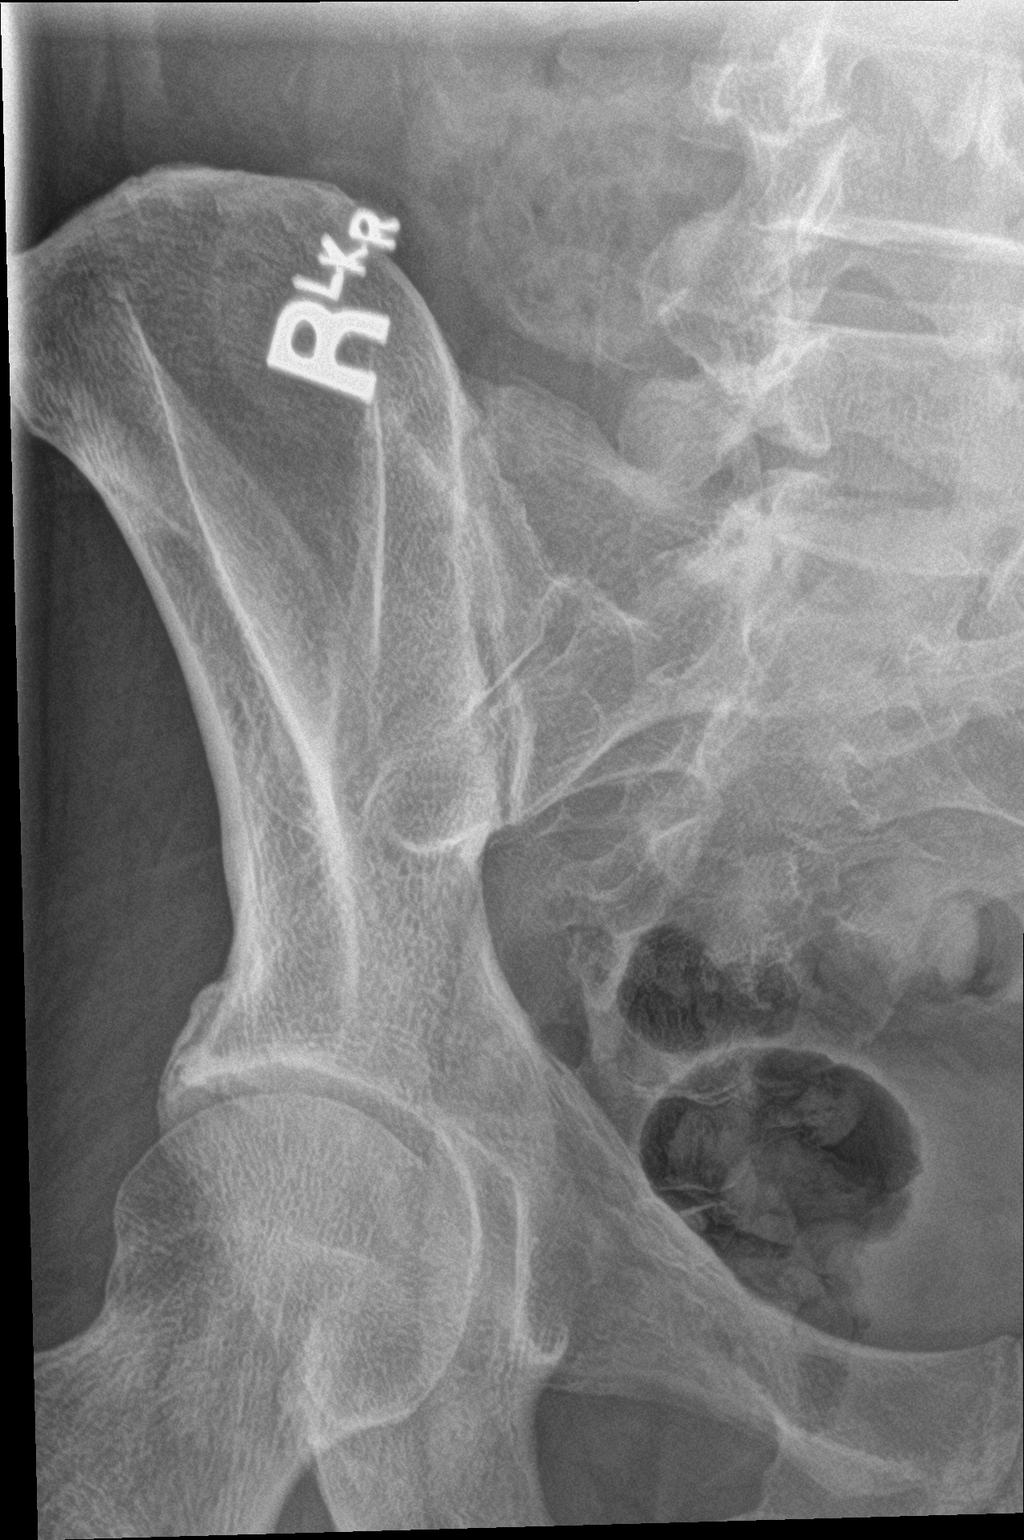

[si joints obl (2 of 2)]
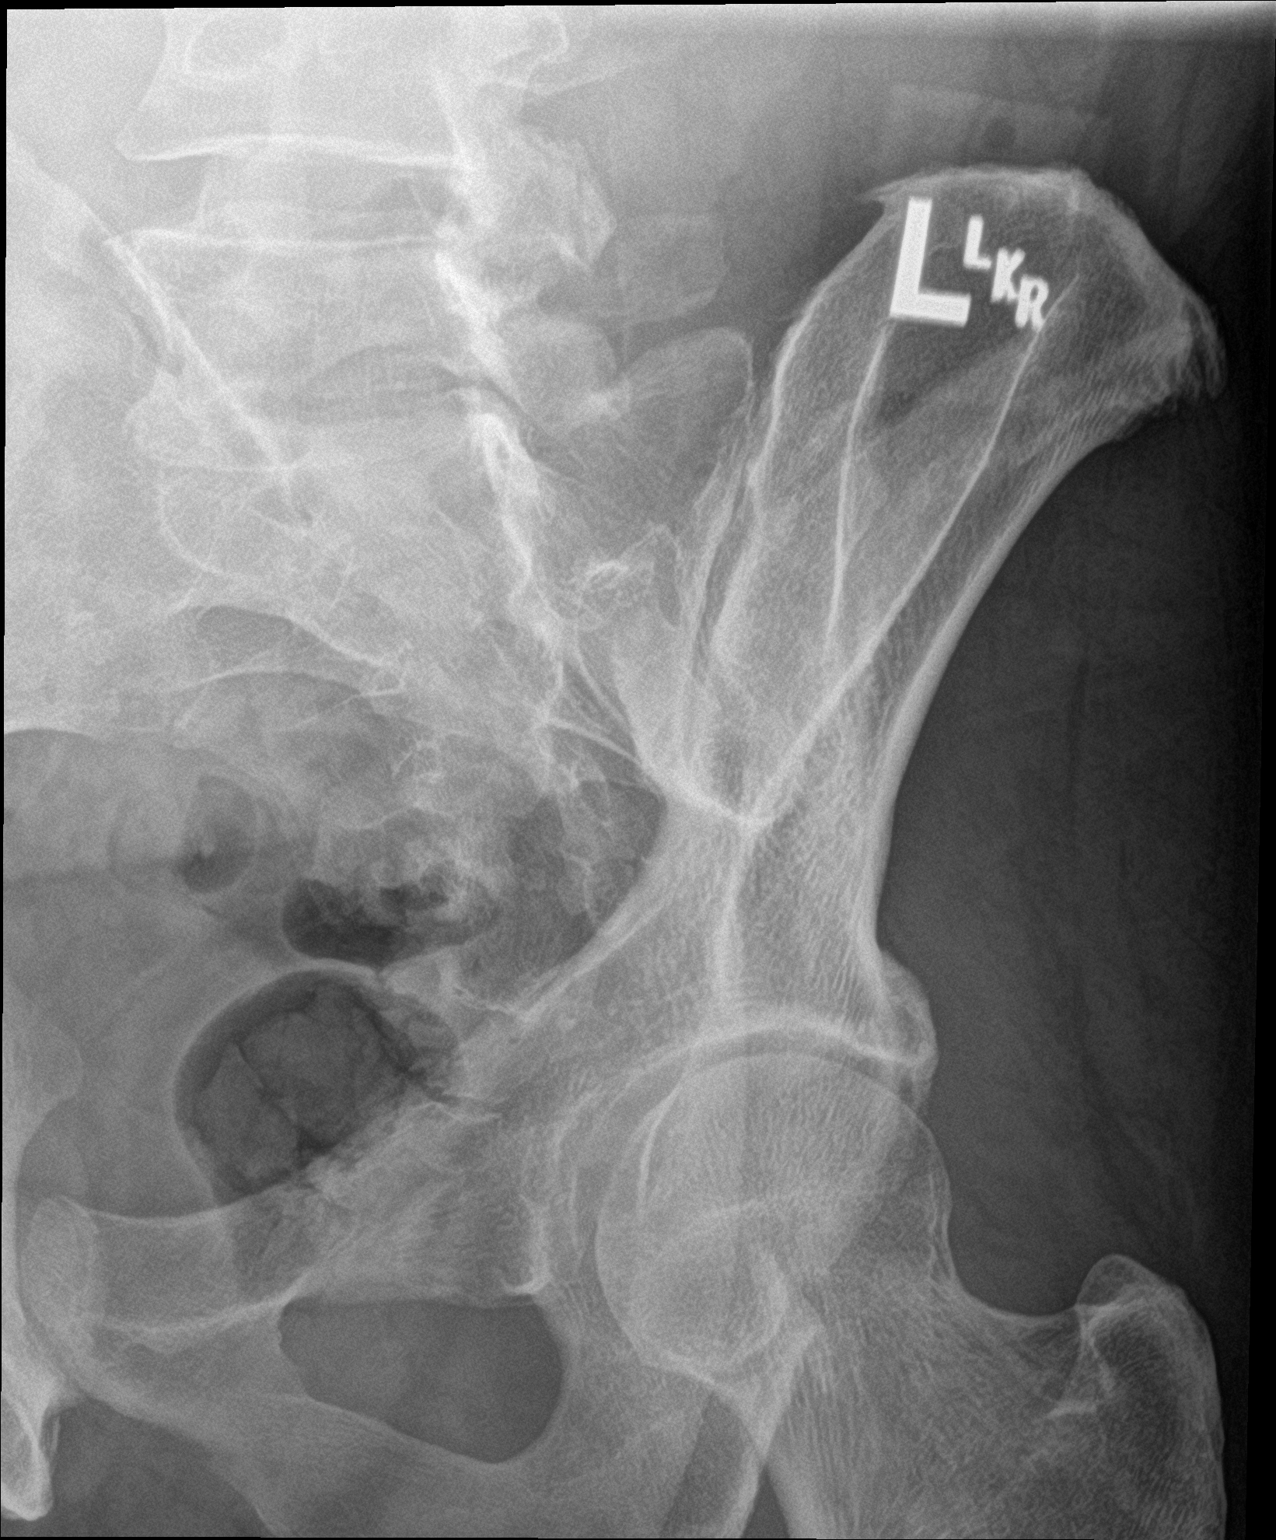

[3 of 3 positions shown; findings below may reference images not displayed]

FINDINGS: Femoral heads are normally located and the pelvis appears intact.
Bone mineralization is within normal limits. Normal sacral ala. The
SI joints appear symmetric and normal. Negative pelvic soft tissue
contours; small left hemipelvis phlebolith (normal variant).
IMPRESSION: Normal radiographic appearance of both SI joints.

## 2019-11-16 IMAGING — CR DG HIP (WITH OR WITHOUT PELVIS) 2-3V*L*
3 series · 3 of 3 positions shown · non-contrast
Comparison: None.

CLINICAL DATA: Sacroiliac joint pain. Left-sided hip pain for 6
months.

EXAM:
DG HIP (WITH OR WITHOUT PELVIS) 2-3V LEFT

[pelvis ap]
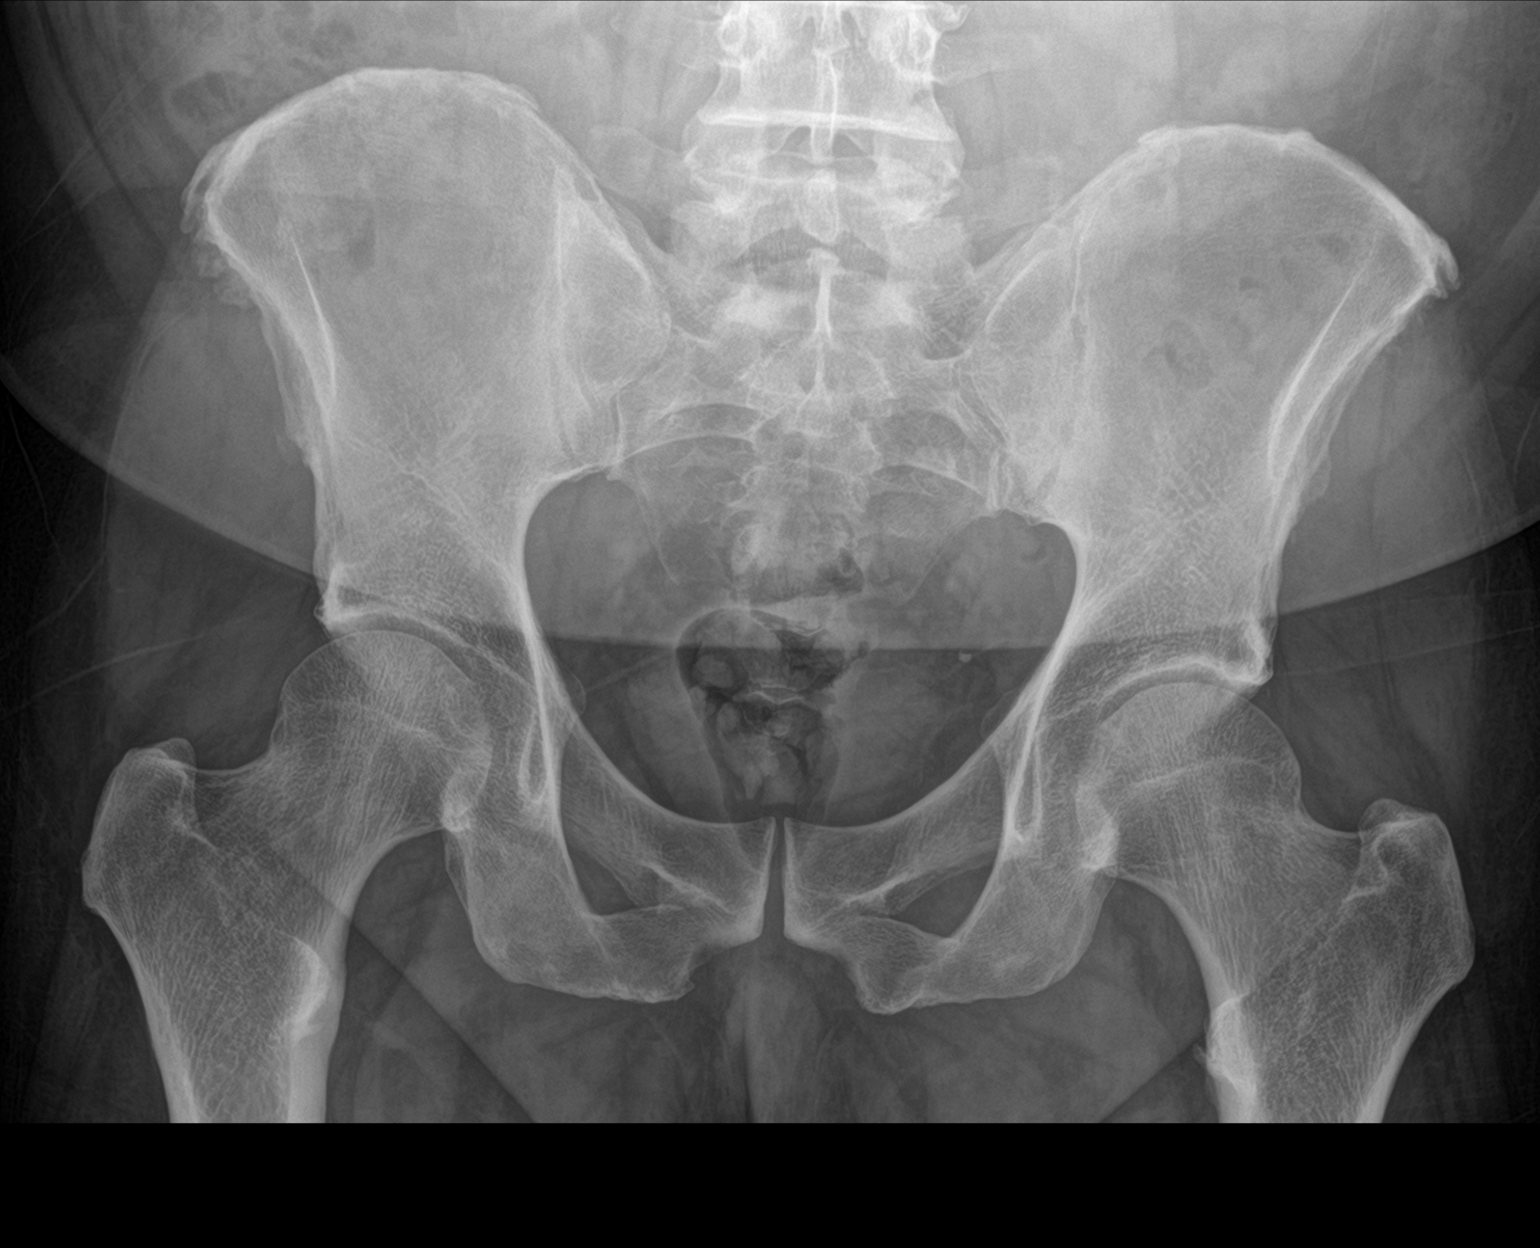

[hip ap]
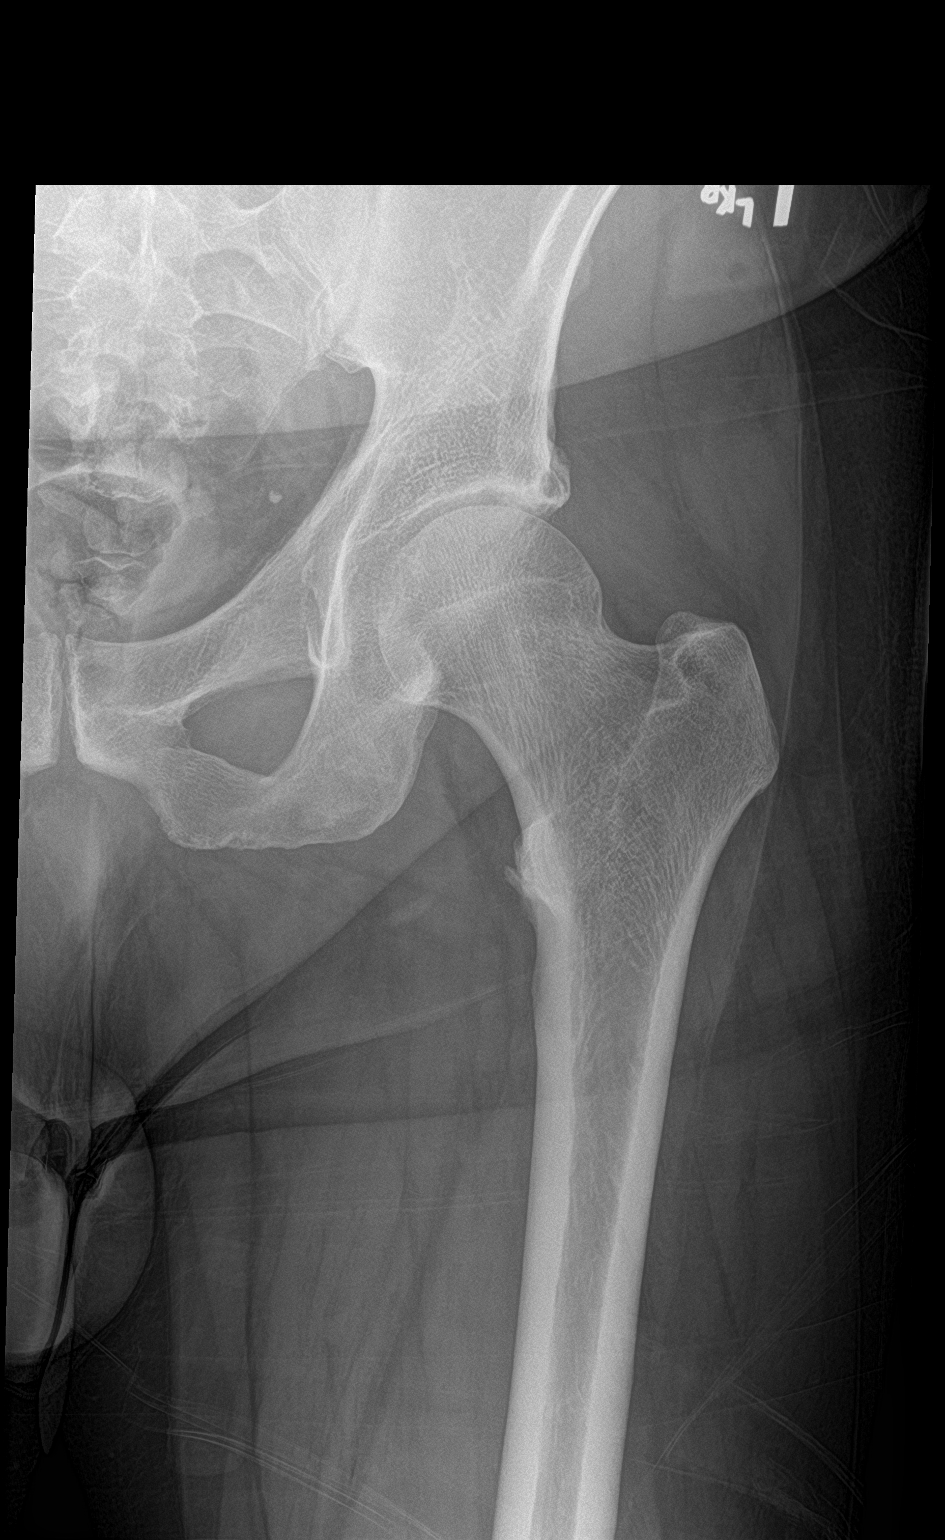

[hip lat]
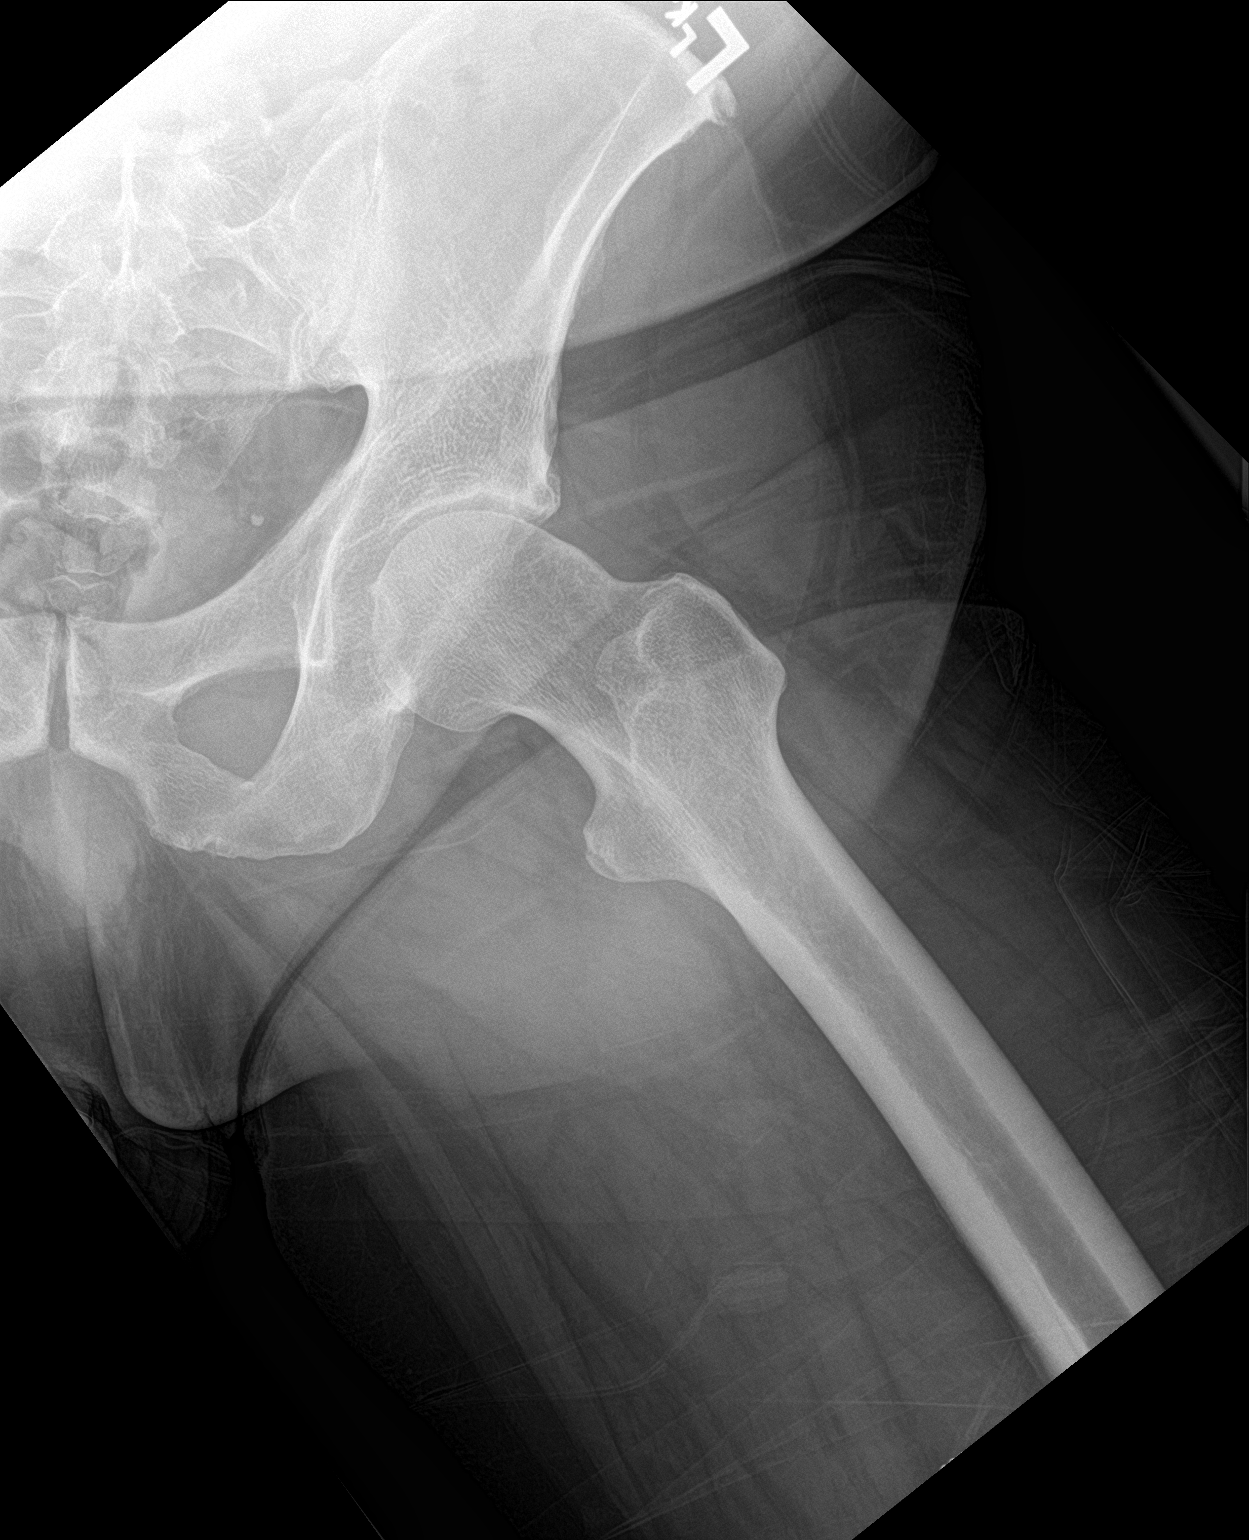

[3 of 3 positions shown; findings below may reference images not displayed]

FINDINGS: There is a small subcortical cyst at the superolateral aspect of the
left acetabulum. The left hip otherwise appears essentially normal.
IMPRESSION: Small subcortical cyst at the superolateral aspect of the left
acetabulum. Otherwise, normal left hip.

## 2019-11-16 IMAGING — CR DG HIP (WITH OR WITHOUT PELVIS) 2-3V*R*
3 series · 3 of 3 positions shown · non-contrast
Comparison: None.

CLINICAL DATA: Hip pain

EXAM:
DG HIP (WITH OR WITHOUT PELVIS) 2-3V RIGHT

[hip ap]
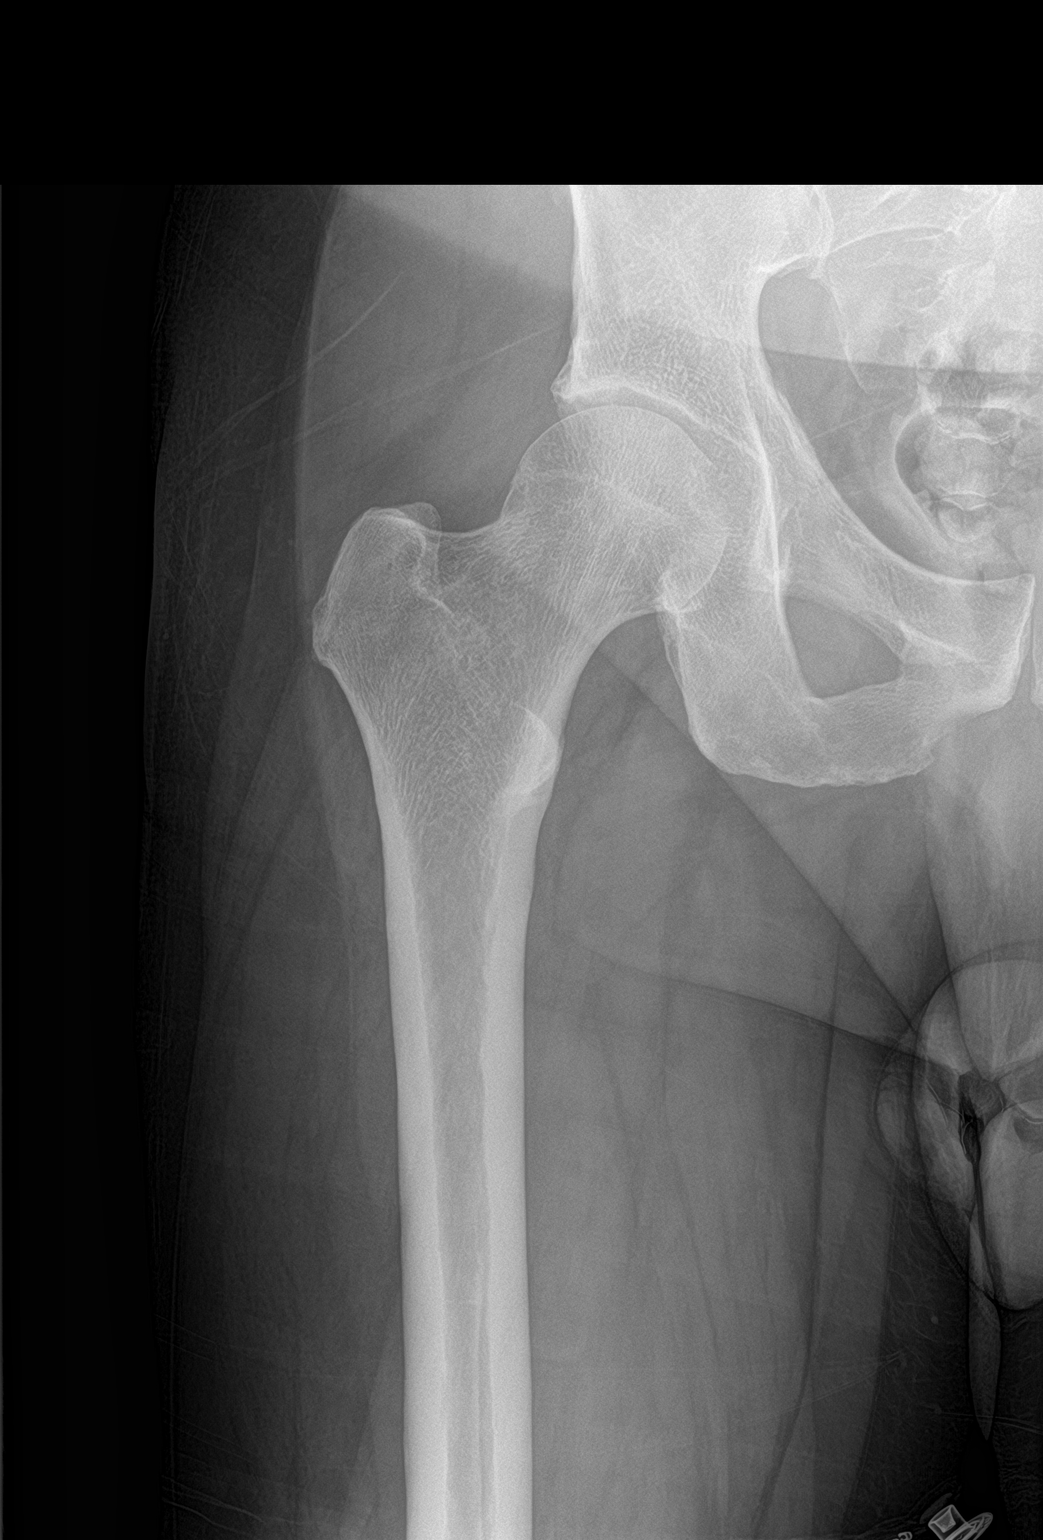

[hip lat]
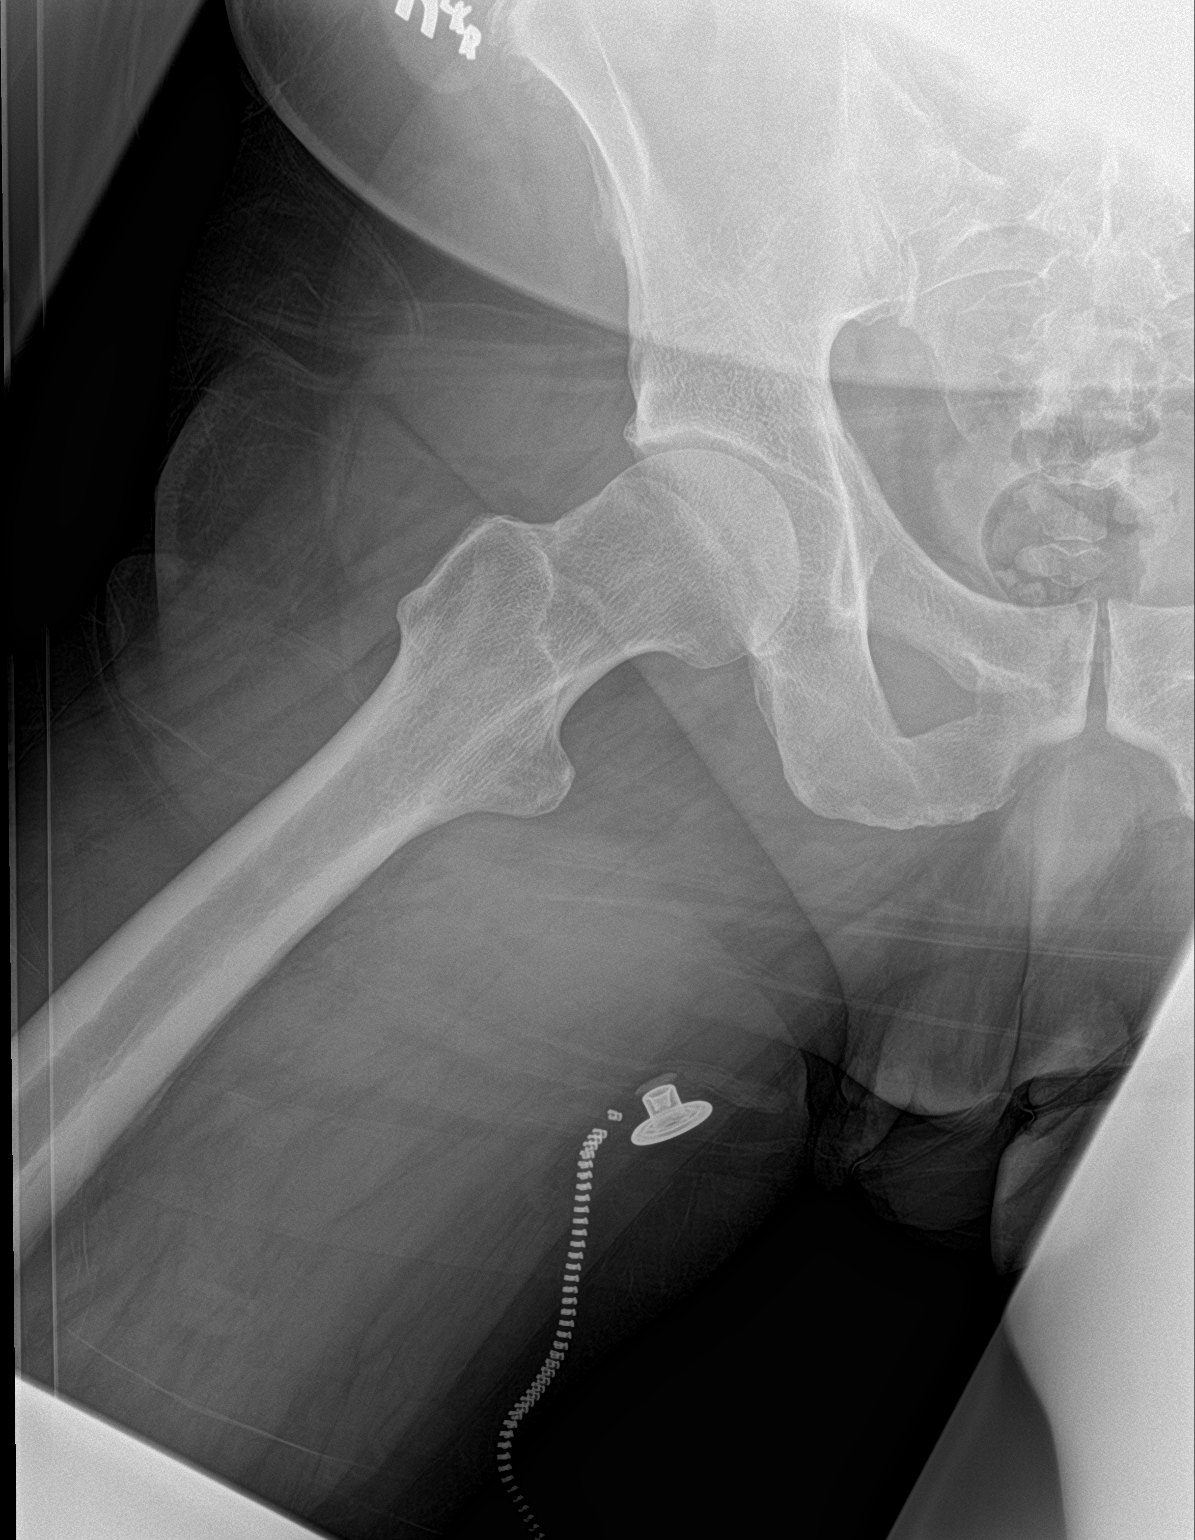

[pelvis ap]
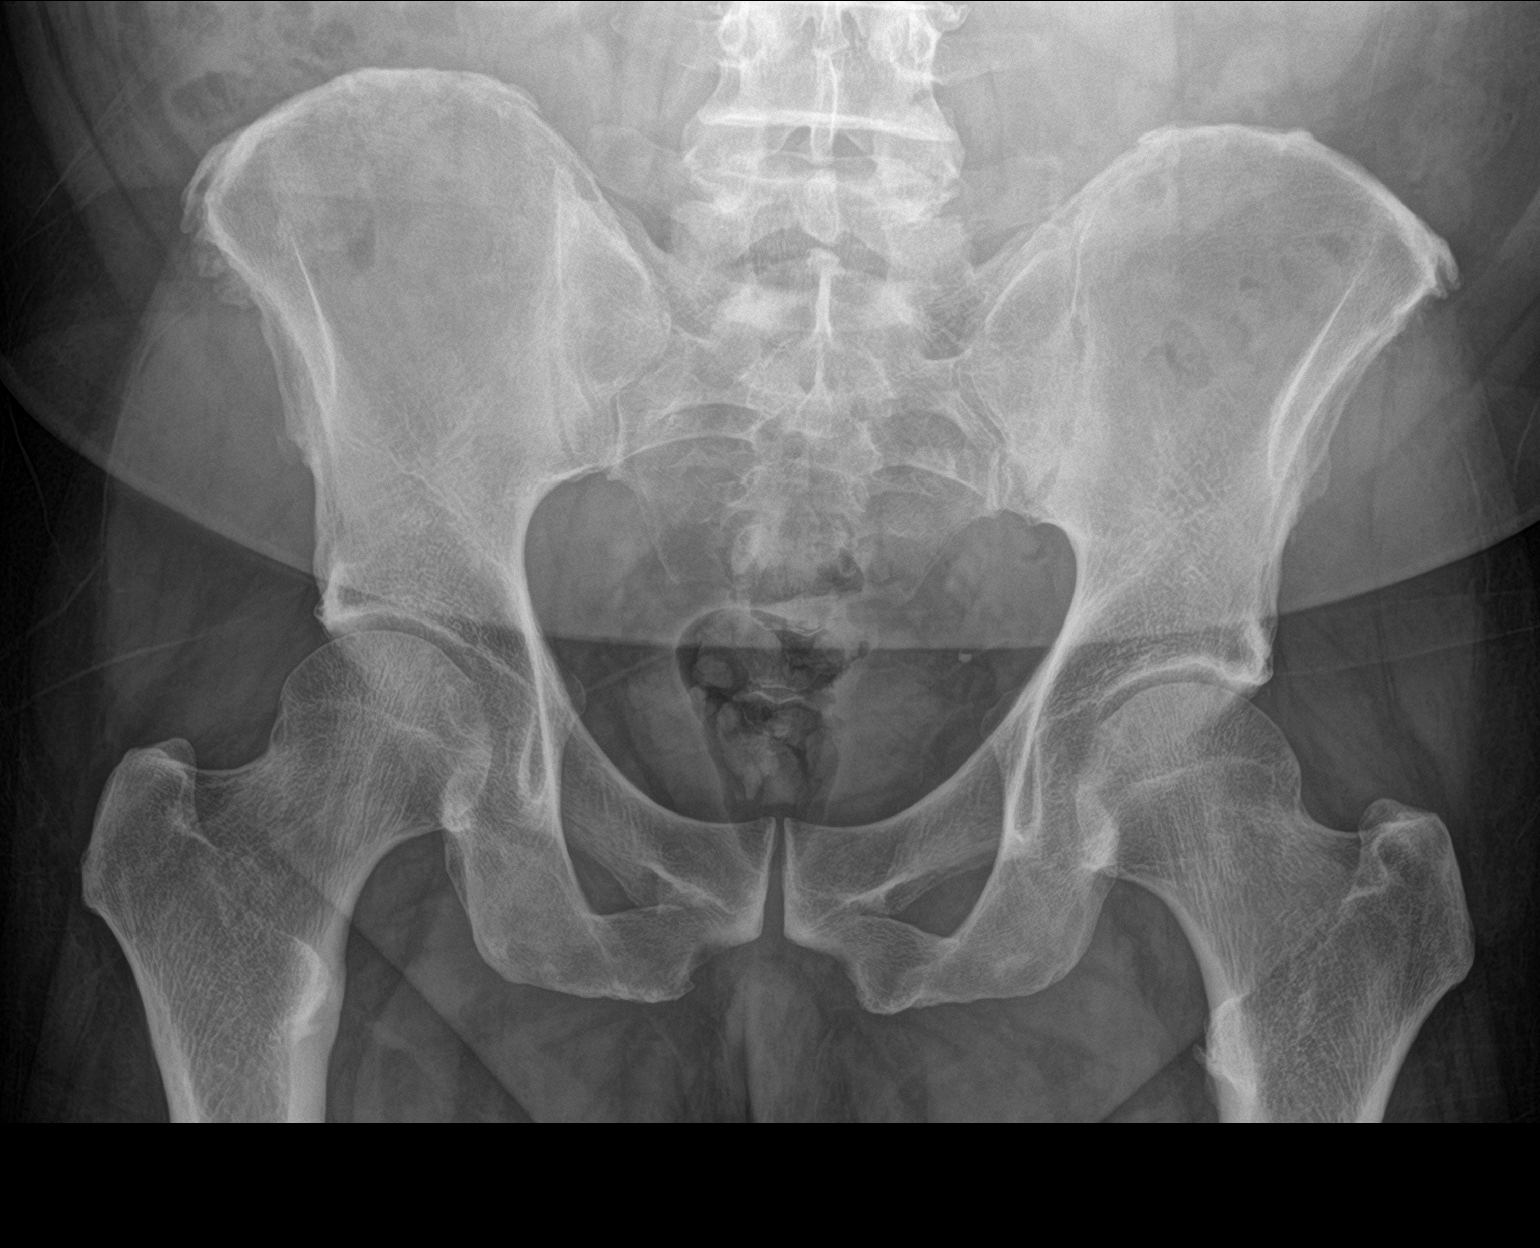

[3 of 3 positions shown; findings below may reference images not displayed]

FINDINGS: Alignment is anatomic. No acute fracture. Joint space is preserved.
No intrinsic osseous lesion.
IMPRESSION: No significant osseous abnormality.

## 2019-11-16 MED ORDER — ROPIVACAINE HCL 2 MG/ML IJ SOLN
9.0000 mL | Freq: Once | INTRAMUSCULAR | Status: AC
  Administered 2019-11-16: 9 mL via PERINEURAL
  Filled 2019-11-16: qty 10

## 2019-11-16 NOTE — Progress Notes (Signed)
Safety precautions to be maintained throughout the outpatient stay will include: orient to surroundings, keep bed in low position, maintain call bell within reach at all times, provide assistance with transfer out of bed and ambulation.  

## 2019-11-16 NOTE — Progress Notes (Signed)
PROVIDER NOTE: Information contained herein reflects review and annotations entered in association with encounter. Interpretation of such information and data should be left to medically-trained personnel. Information provided to patient can be located elsewhere in the medical record under "Patient Instructions". Document created using STT-dictation technology, any transcriptional errors that may result from process are unintentional.    Patient: Jordan Welch  Service Category: Procedure  Provider: Edward Jolly, MD  DOB: 28-Jul-1960  DOS: 11/16/2019  Location: [facility]  MRN: [id number]  Setting: Ambulatory - outpatient  Referring Provider: Edward Jolly, MD  Type: Established Patient  Specialty: Interventional Pain Management  PCP: [facility]   Primary Reason for Visit: Interventional Pain Management Treatment. CC: Neck Pain (upper )  Procedure:          Anesthesia, Analgesia, Anxiolysis:  Type:Trigger Point Injection (3+ muscle groups)          CPT: 20553 Primary Purpose: Diagnostic  Level: Cervical, Trapezius, Occipitalis   Type: Local Anesthesia Indication(s): Analgesia         Local Anesthetic: Lidocaine 1-2% Route: Infiltration ([place]/IM) IV Access: Declined Sedation: Declined   Position: Sitting   Indications: 1. Myofascial pain    Pain Score: Pre-procedure: 2 /10 Post-procedure: 0-No pain/10   Pre-op Assessment:  [name] is a 59 y.o. (year old), male patient, seen today for interventional treatment. He  has a past surgical history that includes Foot surgery (Right). [name] has a current medication list which includes the following prescription(s): atorvastatin, citalopram, diclofenac, duloxetine, famotidine, gabapentin, hydroxyzine, metformin, omeprazole, pantoprazole, and trazodone. His primarily concern today is the Neck Pain (upper )  Initial Vital Signs:  Pulse/HCG Rate: 76  Temp: 97.8 F (36.6 C) Resp: 16 BP: 133/70 SpO2: 98 %  BMI:  Estimated body mass index is 33.47 kg/m as calculated from the following:   Height as of this encounter: 5\' 11"  (1.803 m).   Weight as of this encounter: 240 lb (108.9 kg).  Risk Assessment: Allergies: Reviewed. He has No Known Allergies.  Allergy Precautions: None required Coagulopathies: Reviewed. None identified.  Blood-thinner therapy: None at this time Active Infection(s): Reviewed. None identified. [name] is afebrile  Site Confirmation: [name] was asked to confirm the procedure and laterality before marking the site Procedure checklist: Completed Consent: Before the procedure and under the influence of no sedative(s), amnesic(s), or anxiolytics, the patient was informed of the treatment options, risks and possible complications. To fulfill our ethical and legal obligations, as recommended by the American Medical Association's Code of Ethics, I have informed the patient of my clinical impression; the nature and purpose of the treatment or procedure; the risks, benefits, and possible complications of the intervention; the alternatives, including doing nothing; the risk(s) and benefit(s) of the alternative treatment(s) or procedure(s); and the risk(s) and benefit(s) of doing nothing. The patient was provided information about the general risks and possible complications associated with the procedure. These may include, but are not limited to: failure to achieve desired goals, infection, bleeding, organ or nerve damage, allergic reactions, paralysis, and death. In addition, the patient was informed of those risks and complications associated to the procedure, such as failure to decrease pain; infection; bleeding; organ or nerve damage with subsequent damage to sensory, motor, and/or autonomic systems, resulting in permanent pain, numbness, and/or weakness of one or several areas of the body; allergic reactions; (i.e.: anaphylactic reaction); and/or death. Furthermore, the patient was informed  of those risks and complications associated with the medications. These  include, but are not limited to: allergic reactions (i.e.: anaphylactic or anaphylactoid reaction(s)); adrenal axis suppression; blood sugar elevation that in diabetics may result in ketoacidosis or comma; water retention that in patients with history of congestive heart failure may result in shortness of breath, pulmonary edema, and decompensation with resultant heart failure; weight gain; swelling or edema; medication-induced neural toxicity; particulate matter embolism and blood vessel occlusion with resultant organ, and/or nervous system infarction; and/or aseptic necrosis of one or more joints. Finally, the patient was informed that Medicine is not an exact science; therefore, there is also the possibility of unforeseen or unpredictable risks and/or possible complications that may result in a catastrophic outcome. The patient indicated having understood very clearly. We have given the patient no guarantees and we have made no promises. Enough time was given to the patient to ask questions, all of which were answered to the patient's satisfaction. Jordan Welch has indicated that he wanted to continue with the procedure. Attestation: I, the ordering provider, attest that I have discussed with the patient the benefits, risks, side-effects, alternatives, likelihood of achieving goals, and potential problems during recovery for the procedure that I have provided informed consent. Date  Time: 11/16/2019 11:44 AM  Pre-Procedure Preparation:  Monitoring: As per clinic protocol. Respiration, ETCO2, SpO2, BP, heart rate and rhythm monitor placed and checked for adequate function Safety Precautions: Patient was assessed for positional comfort and pressure points before starting the procedure. Time-out: I initiated and conducted the "Time-out" before starting the procedure, as per protocol. The patient was asked to participate by confirming the  accuracy of the "Time Out" information. Verification of the correct person, site, and procedure were performed and confirmed by me, the nursing staff, and the patient. "Time-out" conducted as per Joint Commission's Universal Protocol (UP.01.01.01). Time: 1216  Description of Procedure:          Area Prepped: Entire Posterior Cervical, Trapezius Region muscles including trapezius, cervical paraspinals, occipitalis. DuraPrep (Iodine Povacrylex [0.7% available iodine] and Isopropyl Alcohol, 74% w/w) Safety Precautions: Aspiration looking for blood return was conducted prior to all injections. At no point did we inject any substances, as a needle was being advanced. No attempts were made at seeking any paresthesias. Safe injection practices and needle disposal techniques used. Medications properly checked for expiration dates. SDV (single dose vial) medications used. Description of the Procedure: Protocol guidelines were followed. The patient was placed in position over the fluoroscopy table. The target area was identified and the area prepped in the usual manner. Skin & deeper tissues infiltrated with local anesthetic. Appropriate amount of time allowed to pass for local anesthetics to take effect. The procedure needles were then advanced to the target area. Proper needle placement secured. Negative aspiration confirmed. Solution injected in intermittent fashion, asking for systemic symptoms every 0.5cc of injectate. The needles were then removed and the area cleansed, making sure to leave some of the prepping solution back to take advantage of its long term bactericidal properties.  Vitals:   11/16/19 1147  BP: 133/70  Pulse: 76  Resp: 16  Temp: 97.8 F (36.6 C)  TempSrc: Temporal  SpO2: 98%  Weight: 240 lb (108.9 kg)  Height: 5\' 11"  (1.803 m)    Start Time: 1216 hrs. End Time: 1222 hrs. Materials:   Approximately 10 trigger points injected in the cervical, trapezius, occipitalis region with  dry needling performed.  Each trigger point was injected with 1 cc of 0.2% ropivacaine.  Imaging Guidance:  Type of Imaging Technique: None used Indication(s): N/A Exposure Time: No patient exposure Contrast: None used. Fluoroscopic Guidance: N/A Ultrasound Guidance: N/A Interpretation: N/A  Antibiotic Prophylaxis:   Anti-infectives (From admission, onward)   None     Indication(s): None identified  Post-operative Assessment:  Post-procedure Vital Signs:  Pulse/HCG Rate: 76  Temp: 97.8 F (36.6 C) Resp: 16 BP: 133/70 SpO2: 98 %  EBL: None  Complications: No immediate post-treatment complications observed by team, or reported by patient.  Note: The patient tolerated the entire procedure well. A repeat set of vitals were taken after the procedure and the patient was kept under observation following institutional policy, for this type of procedure. Post-procedural neurological assessment was performed, showing return to baseline, prior to discharge. The patient was provided with post-procedure discharge instructions, including a section on how to identify potential problems. Should any problems arise concerning this procedure, the patient was given instructions to immediately contact us, at any time, without hesitation. In any case, we plan to contact the patient by telephone for a follow-up status report regarding this interventional procedure.  Comments:  No additional relevant information.  Plan of Care   Medications ordered for procedure: Meds ordered this encounter  Medications  . ropivacaine (PF) 2 mg/mL (0.2%) (NAROPIN) injection 9 mL   Medications administered: We administered ropivacaine (PF) 2 mg/mL (0.2%).  See the medical record for exact dosing, route, and time of administration.  Follow-up plan:   Return in about 4 weeks (around 12/14/2019) for Post Procedure Evaluation, virtual.     Recent Visits Date Type Provider Dept  10/15/19 Office Visit  Edward Jolly, MD [facility]  09/09/19 Procedure visit Edward Jolly, MD [facility]  08/27/19 Office Visit Edward Jolly, MD [facility]  Showing recent visits within past 90 days and meeting all other requirements   Today's Visits Date Type Provider Dept  11/16/19 Procedure visit Edward Jolly, MD [facility]  Showing today's visits and meeting all other requirements   Future Appointments Date Type Provider Dept  12/15/19 Appointment Edward Jolly, MD [facility]  Showing future appointments within next 90 days and meeting all other requirements   Disposition: Discharge home  Discharge (Date  Time): 11/16/2019; 1222 hrs.   Primary Care Physician: [facility] Location[facility] Note by: Edward Jolly, MD Date: 11/16/2019; Time: 12:34 PM  Disclaimer:  Medicine is not an exact science. The only guarantee in medicine is that nothing is guaranteed. It is important to note that the decision to proceed with this intervention was based on the information collected from the patient. The Data and conclusions were drawn from the patient's questionnaire, the interview, and the physical examination. Because the information was provided in large part by the patient, it cannot be guaranteed that it has not been purposely or unconsciously manipulated. Every effort has been made to obtain as much relevant data as possible for this evaluation. It is important to note that the conclusions that lead to this procedure are derived in large part from the available data. Always take into account that the treatment will also be dependent on availability of resources and existing treatment guidelines, considered by other Pain Management Practitioners as being common knowledge and practice, at the time of the intervention. For Medico-Legal purposes, it is also important to point out that variation in procedural techniques and  pharmacological choices are the acceptable norm. The indications, contraindications, technique, and results of the above procedure should  only be interpreted and judged by a Board-Certified Interventional Pain Specialist with extensive familiarity and expertise in the same exact procedure and technique.

## 2019-11-17 ENCOUNTER — Telehealth: Payer: Self-pay | Admitting: *Deleted

## 2019-11-17 NOTE — Telephone Encounter (Signed)
Attempted to call for post procedure follow-up. Message left. 

## 2019-12-14 ENCOUNTER — Telehealth: Payer: Self-pay | Admitting: *Deleted

## 2019-12-14 NOTE — Telephone Encounter (Signed)
Attempted to call for pre appointment review of allergies/meds. Message left. 

## 2019-12-15 ENCOUNTER — Other Ambulatory Visit: Payer: Self-pay

## 2019-12-15 ENCOUNTER — Ambulatory Visit
Payer: No Typology Code available for payment source | Attending: Student in an Organized Health Care Education/Training Program | Admitting: Student in an Organized Health Care Education/Training Program

## 2019-12-15 ENCOUNTER — Encounter (INDEPENDENT_AMBULATORY_CARE_PROVIDER_SITE_OTHER): Payer: Self-pay

## 2019-12-15 ENCOUNTER — Encounter: Payer: Self-pay | Admitting: Student in an Organized Health Care Education/Training Program

## 2019-12-15 DIAGNOSIS — M5136 Other intervertebral disc degeneration, lumbar region: Secondary | ICD-10-CM

## 2019-12-15 DIAGNOSIS — M67912 Unspecified disorder of synovium and tendon, left shoulder: Secondary | ICD-10-CM

## 2019-12-15 DIAGNOSIS — M7918 Myalgia, other site: Secondary | ICD-10-CM | POA: Diagnosis not present

## 2019-12-15 DIAGNOSIS — M47816 Spondylosis without myelopathy or radiculopathy, lumbar region: Secondary | ICD-10-CM | POA: Diagnosis not present

## 2019-12-15 DIAGNOSIS — M5412 Radiculopathy, cervical region: Secondary | ICD-10-CM | POA: Diagnosis not present

## 2019-12-15 DIAGNOSIS — G894 Chronic pain syndrome: Secondary | ICD-10-CM

## 2019-12-15 DIAGNOSIS — M47812 Spondylosis without myelopathy or radiculopathy, cervical region: Secondary | ICD-10-CM

## 2019-12-15 MED ORDER — GABAPENTIN 300 MG PO CAPS: 300.0000 mg | ORAL_CAPSULE | Freq: Three times a day (TID) | ORAL | 0 refills | Status: DC

## 2019-12-15 MED ORDER — DICLOFENAC SODIUM 75 MG PO TBEC: 75.0000 mg | DELAYED_RELEASE_TABLET | Freq: Two times a day (BID) | ORAL | 2 refills | Status: DC | PRN

## 2019-12-15 NOTE — Progress Notes (Signed)
Patient: Jordan Welch  Service Category: E/M  Provider: [name], MD  DOB: 1961/02/25  DOS: 12/15/2019  Location: Office  MRN: [id number]  Setting: Ambulatory outpatient  Referring Provider: [facility]  Type: Established Patient  Specialty: Interventional Pain Management  PCP: [facility]  Location: Home  Delivery: TeleHealth     Virtual Encounter - Pain Management PROVIDER NOTE: Information contained herein reflects review and annotations entered in association with encounter. Interpretation of such information and data should be left to medically-trained personnel. Information provided to patient can be located elsewhere in the medical record under "Patient Instructions". Document created using STT-dictation technology, any transcriptional errors that may result from process are unintentional.    Contact & Pharmacy Preferred: [phone] Home: [phone] (home) Mobile: There is no such number on file (mobile). E-mail: [email or web address]  [facility], [place]. [place] [address] Phone: [phone] Fax: [phone]   Pre-screening  Jordan Welch offered "in-person" vs "virtual" encounter. He indicated preferring virtual for this encounter.   Reason COVID-19*  Social distancing based on CDC and AMA recommendations.   I contacted Jordan Welch on 12/15/2019 via video conference.      I clearly identified myself as [name], MD. I verified that I was speaking with the correct person using two identifiers (Name: Jordan Welch, and date of birth: 09-01-1960).  Consent I sought verbal advanced consent from Jordan Welch for virtual visit interactions. I informed Jordan Welch of possible security and privacy concerns, risks, and limitations associated with providing "not-in-person" medical evaluation and management services. I also informed Jordan Welch of the availability of "in-person" appointments. Finally, I informed him that there would  be a charge for the virtual visit and that he could be  personally, fully or partially, financially responsible for it. Jordan Welch expressed understanding and agreed to proceed.   Historic Elements   Jordan Welch is a 59 y.o. year old, male patient evaluated today after his last contact with our practice on 12/14/2019. Jordan Welch  has a past medical history of Anxiety, High cholesterol, and Reflux. He also  has a past surgical history that includes Foot surgery (Right). Jordan Welch has a current medication list which includes the following prescription(s): atorvastatin, citalopram, diclofenac, duloxetine, famotidine, gabapentin, hydroxyzine, metformin, omeprazole, pantoprazole, and trazodone. He  reports that he has never smoked. He has never used smokeless tobacco. He reports current alcohol use. He reports that he does not use drugs. Jordan Welch has No Known Allergies.   HPI  Today, he is being contacted for a post-procedure assessment.  Good relief from TPI that was done on 11/16/19. Improved ROM and overall less pain. Cervical epidural injection also helped. Lumbar paraspinal pain intermittent, will continue to monitor.  Laboratory Chemistry Profile   Renal No results found for: BUN, CREATININE, LABCREA, BCR, GFR, GFRAA, GFRNONAA, LABVMA, EPIRU, EPINEPH24HUR, NOREPRU, NOREPI24HUR, DOPARU, [id number]   Hepatic No results found for: AST, ALT, ALBUMIN, ALKPHOS, HCVAB, AMYLASE, LIPASE, AMMONIA   Electrolytes No results found for: NA, K, CL, CALCIUM, MG, PHOS   Bone No results found for: VD25OH, [id number], [id number], [id number], 25OHVITD1, 25OHVITD2, 25OHVITD3, TESTOFREE, TESTOSTERONE   Inflammation (CRP: Acute Phase) (ESR: Chronic Phase) No results found for: CRP, ESRSEDRATE, LATICACIDVEN     Note: Above Lab results reviewed.  Imaging  DG Si Joints CLINICAL DATA:  59 year old male with left side hip pain for 6 months. SI joint pain. No known injury.  EXAM: BILATERAL SACROILIAC JOINTS -  3+  VIEW  COMPARISON:  Hip series today reported separately.  FINDINGS: Femoral heads are normally located and the pelvis appears intact. Bone mineralization is within normal limits. Normal sacral ala. The SI joints appear symmetric and normal. Negative pelvic soft tissue contours; small left hemipelvis phlebolith (normal variant).  IMPRESSION: Normal radiographic appearance of both SI joints.  Electronically Signed   By: Genevie Ann M.D.   On: 11/16/2019 16:36 DG HIP UNILAT W OR W/O PELVIS 2-3 VIEWS LEFT CLINICAL DATA:  Sacroiliac joint pain. Left-sided hip pain for 6 months.  EXAM: DG HIP (WITH OR WITHOUT PELVIS) 2-3V LEFT  COMPARISON:  None.  FINDINGS: There is a small subcortical cyst at the superolateral aspect of the left acetabulum. The left hip otherwise appears essentially normal.  IMPRESSION: Small subcortical cyst at the superolateral aspect of the left acetabulum. Otherwise, normal left hip.  Electronically Signed   By: Lorriane Shire M.D.   On: 11/16/2019 16:36 DG HIP UNILAT W OR W/O PELVIS 2-3 VIEWS RIGHT CLINICAL DATA:  Hip pain  EXAM: DG HIP (WITH OR WITHOUT PELVIS) 2-3V RIGHT  COMPARISON:  None.  FINDINGS: Alignment is anatomic. No acute fracture. Joint space is preserved. No intrinsic osseous lesion.  IMPRESSION: No significant osseous abnormality.  Electronically Signed   By: Macy Mis M.D.   On: 11/16/2019 16:35  Xrays negative.  Assessment  The primary encounter diagnosis was Lumbar facet arthropathy. Diagnoses of Lumbar degenerative disc disease, Myofascial pain, Cervical radicular pain, Cervical facet joint syndrome, Chronic pain syndrome, and Dysfunction of left rotator cuff were also pertinent to this visit.  Plan of Care  Jordan Welch has a current medication list which includes the following long-term medication(s): atorvastatin, citalopram, duloxetine, famotidine, gabapentin, metformin, omeprazole, pantoprazole, and  trazodone.  1. Lumbar facet arthropathy - diclofenac (VOLTAREN) 75 MG EC tablet; Take 1 tablet (75 mg total) by mouth 2 (two) times daily as needed.  Dispense: 60 tablet; Refill: 2  2. Lumbar degenerative disc disease - diclofenac (VOLTAREN) 75 MG EC tablet; Take 1 tablet (75 mg total) by mouth 2 (two) times daily as needed.  Dispense: 60 tablet; Refill: 2  3. Myofascial pain - TRIGGER POINT INJECTION; Standing  4. Cervical radicular pain - gabapentin (NEURONTIN) 300 MG capsule; Take 1 capsule (300 mg total) by mouth 3 (three) times daily.  Dispense: 270 capsule; Refill: 0 - Cervical Epidural Injection; Standing  5. Chronic pain syndrome - gabapentin (NEURONTIN) 300 MG capsule; Take 1 capsule (300 mg total) by mouth 3 (three) times daily.  Dispense: 270 capsule; Refill: 0 - diclofenac (VOLTAREN) 75 MG EC tablet; Take 1 tablet (75 mg total) by mouth 2 (two) times daily as needed.  Dispense: 60 tablet; Refill: 2  6. Dysfunction of left rotator cuff - MR SHOULDER LEFT WO CONTRAST; Future   Pharmacotherapy (Medications Ordered): Meds ordered this encounter  Medications  . gabapentin (NEURONTIN) 300 MG capsule    Sig: Take 1 capsule (300 mg total) by mouth 3 (three) times daily.    Dispense:  270 capsule    Refill:  0  . diclofenac (VOLTAREN) 75 MG EC tablet    Sig: Take 1 tablet (75 mg total) by mouth 2 (two) times daily as needed.    Dispense:  60 tablet    Refill:  2   Orders:  Orders Placed This Encounter  Procedures  . TRIGGER POINT INJECTION    Standing Status:   Standing    Number of Occurrences:  6    Standing Expiration Date:   12/14/2020    Scheduling Instructions:     Cervical PRN    Order Specific Question:   Where will this procedure be performed?    Answer:   [facility]  . Cervical Epidural Injection    Procedure: Cervical Epidural Steroid Injection/Block Purpose: Diagnostic Indication(s): Radiculitis and/or cervicalgia associater with cervical  degenerative disc disease.    Standing Status:   Standing    Number of Occurrences:   6    Standing Expiration Date:   06/16/2020    Scheduling Instructions:     Level(s): C7-T1     Laterality: TBD     Sedation: Patient's choice.     Timeframe: PRN    Order Specific Question:   Where will this procedure be performed?    Answer:   [facility]    Comments:   Jordan Welch  . MR SHOULDER LEFT WO CONTRAST    Standing Status:   Future    Standing Expiration Date:   06/16/2020    Order Specific Question:   What is the patient's sedation requirement?    Answer:   No Sedation    Order Specific Question:   Does the patient have a pacemaker or implanted devices?    Answer:   No    Order Specific Question:   Preferred imaging location?    Answer:   [facility] (table limit - [phone])    Order Specific Question:   Radiology Contrast Protocol - do NOT remove file path    Answer:   \\[email or web address]   Follow-up plan:   No follow-ups on file.   Recent Visits Date Type Provider Dept  11/16/19 Procedure visit [name], MD [facility]  10/15/19 Office Visit [name], MD [facility]  Showing recent visits within past 90 days and meeting all other requirements   Today's Visits Date Type Provider Dept  12/15/19 Telemedicine [name], MD [facility]  Showing today's visits and meeting all other requirements   Future Appointments No visits were found meeting these conditions.  Showing future appointments within next 90 days and meeting all other requirements   I discussed the assessment and treatment plan with the patient. The patient was provided an opportunity to ask questions and all were answered. The patient agreed with the plan and demonstrated an understanding of the instructions.  Patient advised to call back or seek an in-person evaluation if the symptoms or condition worsens.  Duration of encounter:  5mnutes.  Note by: BGillis Santa MD Date: 12/15/2019; Time: 12:11 PM

## 2020-01-22 ENCOUNTER — Ambulatory Visit
Admission: RE | Admit: 2020-01-22 | Discharge: 2020-01-22 | Disposition: A | Discharge status: Home / Self Care | Payer: No Typology Code available for payment source | Source: Ambulatory Visit | Attending: Student in an Organized Health Care Education/Training Program | Admitting: Student in an Organized Health Care Education/Training Program

## 2020-01-22 ENCOUNTER — Other Ambulatory Visit: Payer: Self-pay

## 2020-01-22 DIAGNOSIS — M67912 Unspecified disorder of synovium and tendon, left shoulder: Secondary | ICD-10-CM | POA: Insufficient documentation

## 2020-01-22 IMAGING — MR MR SHOULDER*L* W/O CM
5 series · 39 of 40 positions shown · non-contrast
Comparison: None.

CLINICAL DATA: Posterior left shoulder pain radiating down the arm
for the past few months. No injury or prior surgery.

EXAM:
MRI OF THE LEFT SHOULDER WITHOUT CONTRAST
TECHNIQUE: Multiplanar, multisequence MR imaging of the shoulder was performed.
No intravenous contrast was administered.

[Series 3: PD fat-sat · axial · left · 4.0mm · 0.62mm/px · z∈[-66,+64]mm · 9 of 28 slices shown (1 of 2)]
[im 1/28]
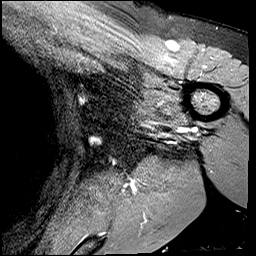
[im 4/28]
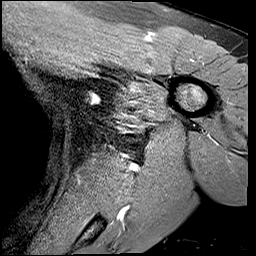
[im 7/28]
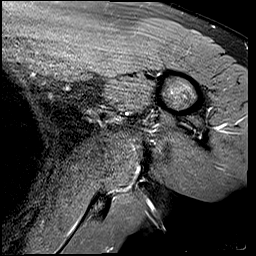
[im 10/28]
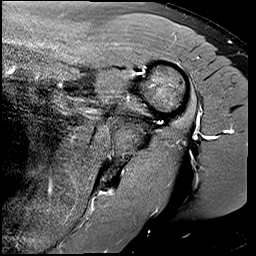
[im 13/28]
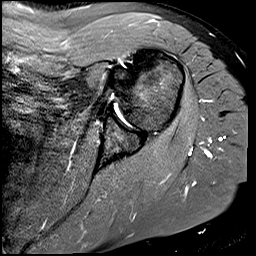
[im 16/28]
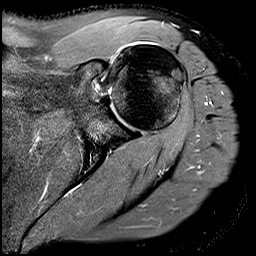
[im 19/28]
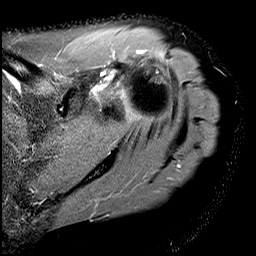
[im 25/28]
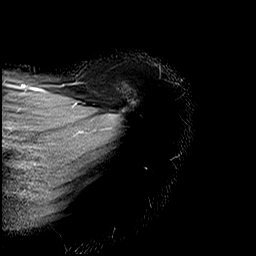
[im 28/28]
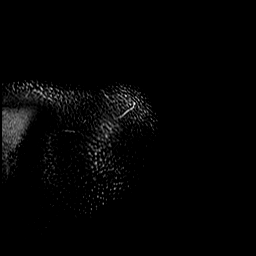

[Series 4: PD fat-sat · oblique · left · 4.0mm · 0.44mm/px · 8 of 26 slices shown (2 of 2)]
[im 1/26]
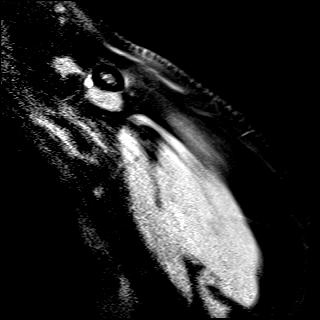
[im 4/26]
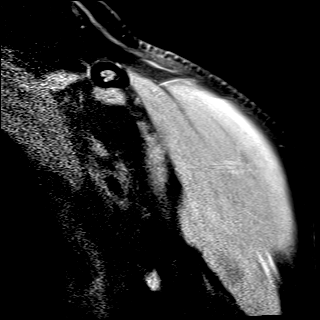
[im 8/26]
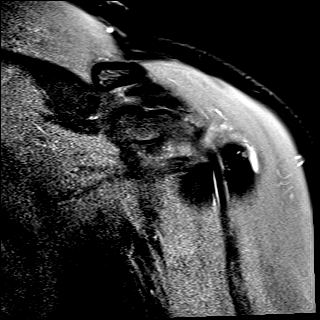
[im 11/26]
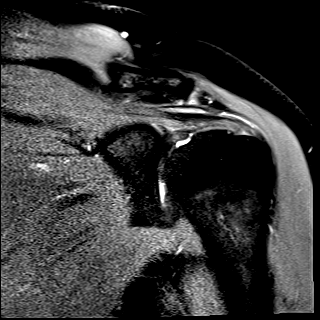
[im 15/26]
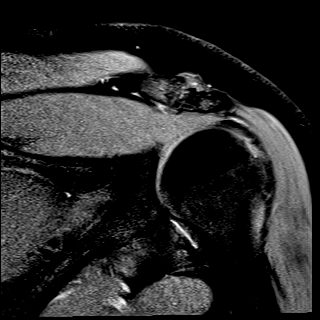
[im 18/26]
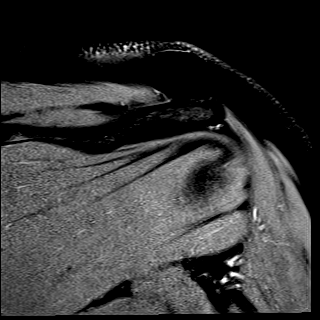
[im 22/26]
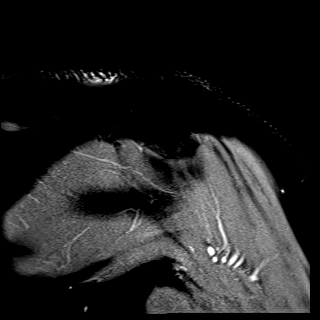
[im 26/26]
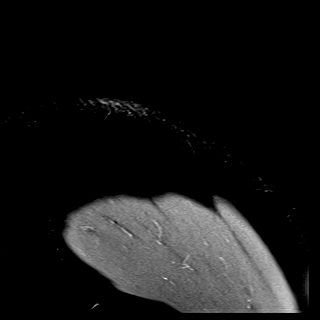

[Series 5: T2 fat-sat · oblique · left · 4.0mm · 0.44mm/px · 8 of 26 slices shown (1 of 2)]
[im 1/26]
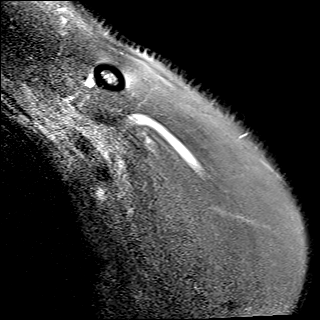
[im 4/26]
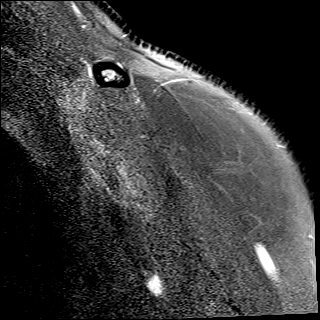
[im 8/26]
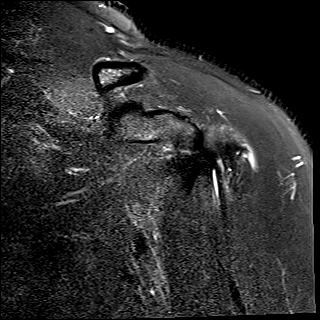
[im 11/26]
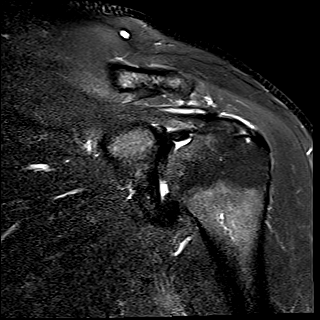
[im 15/26]
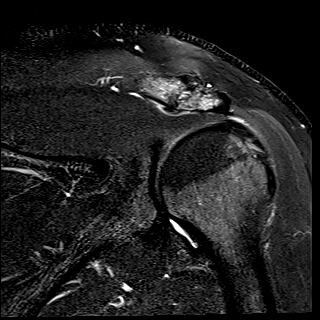
[im 18/26]
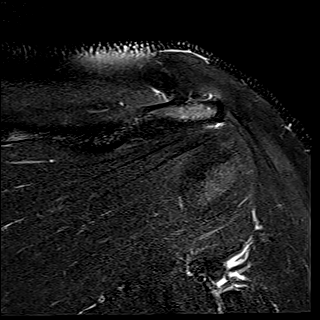
[im 22/26]
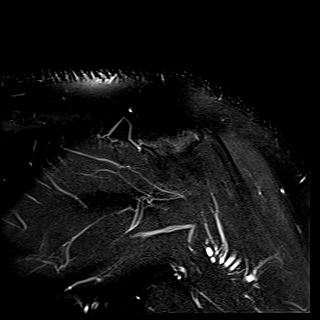
[im 26/26]
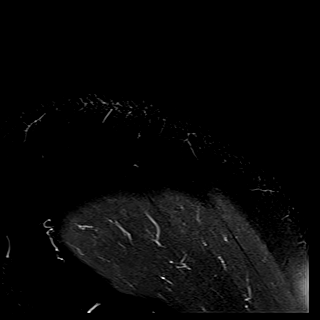

[Series 6: T2 fat-sat · oblique · left · 4.0mm · 0.27mm/px · 7 of 22 slices shown (2 of 2)]
[im 1/22]
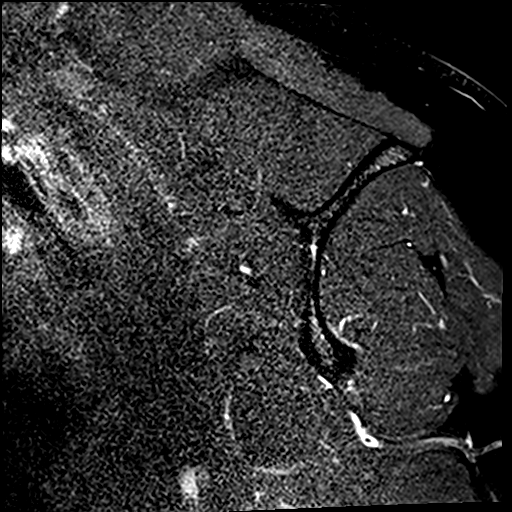
[im 4/22]
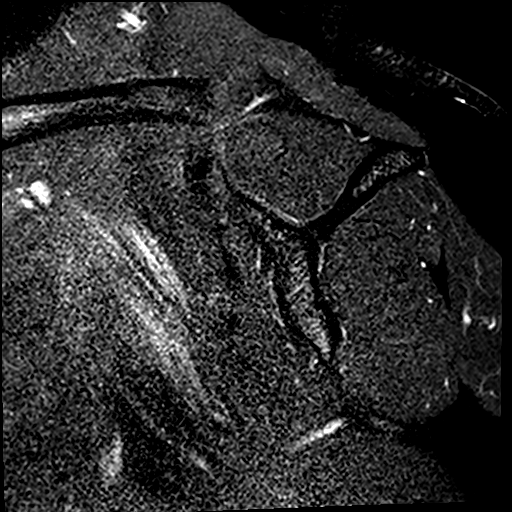
[im 8/22]
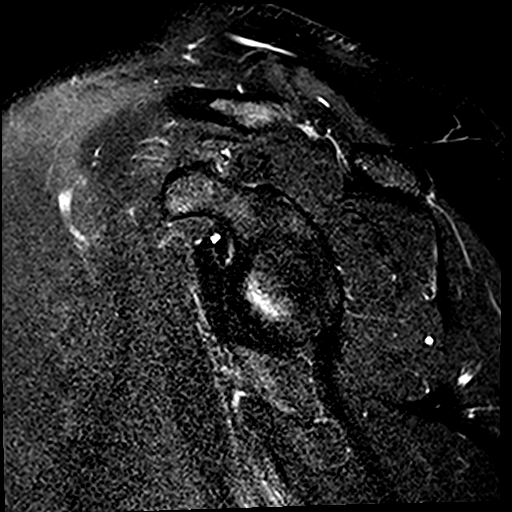
[im 11/22]
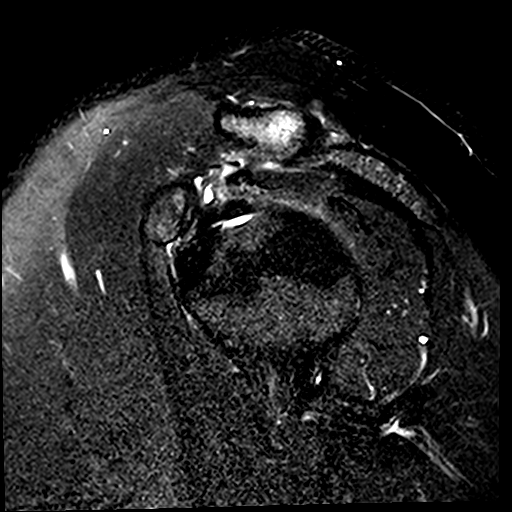
[im 15/22]
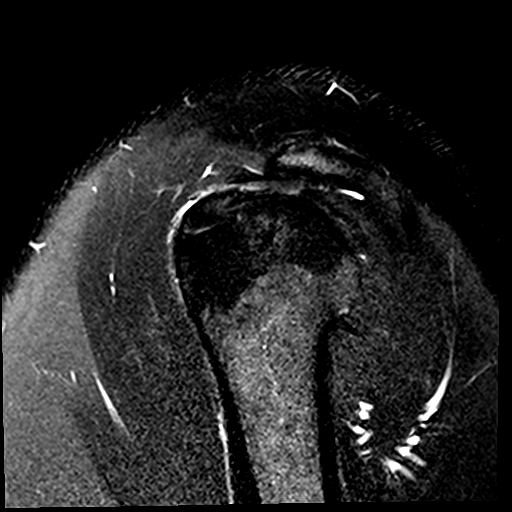
[im 18/22]
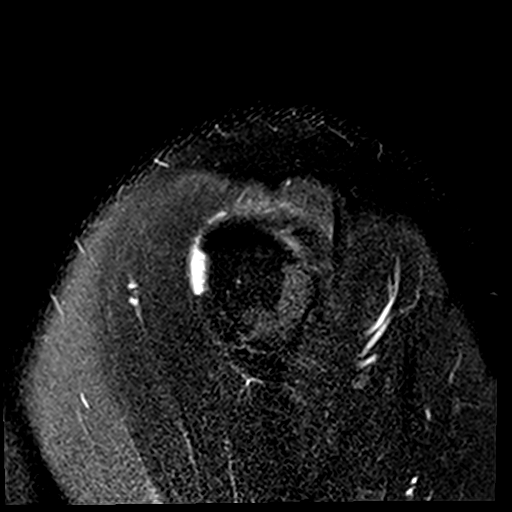
[im 22/22]
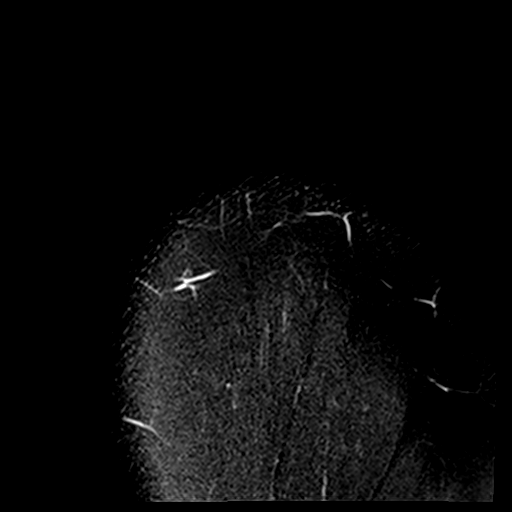

[Series 7: T1 · oblique · left · 4.0mm · 0.44mm/px · 7 of 22 slices shown]
[im 1/22]
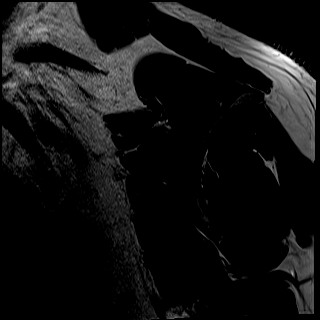
[im 4/22]
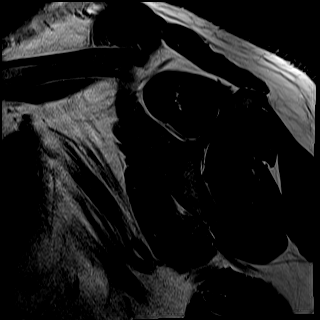
[im 8/22]
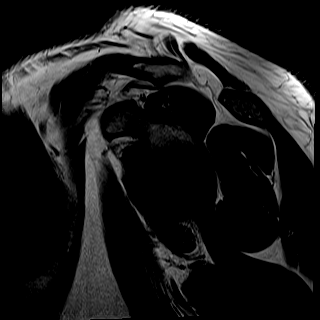
[im 11/22]
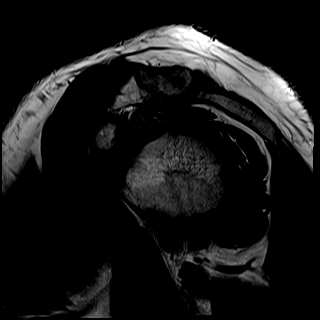
[im 15/22]
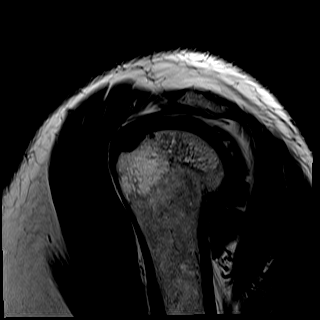
[im 18/22]
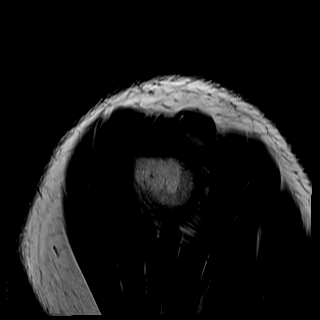
[im 22/22]
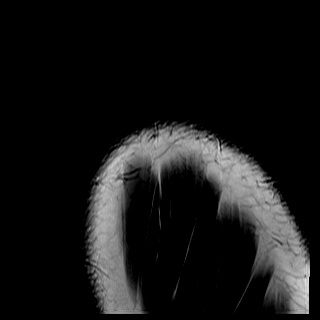

[39 of 40 positions shown; findings below may reference images not displayed]

FINDINGS: Rotator cuff: Small partial-thickness bursal surface tear of the
supraspinatus tendon anteriorly at the insertion. The infraspinatus,
teres minor, and subscapularis tendons are unremarkable.

Muscles: No atrophy or abnormal signal of the muscles of the rotator
cuff.

Biceps long head:  Intact and normally positioned.

Acromioclavicular Joint: Mild arthropathy of the acromioclavicular
joint. Type II acromion. Trace fluid in the subacromial/subdeltoid
bursa.

Glenohumeral Joint: No joint effusion. No chondral defect.

Labrum: Grossly intact, but evaluation is limited by lack of
intraarticular fluid.

Bones:  No marrow abnormality, fracture or dislocation.

Other: None.
IMPRESSION: 1. Small partial-thickness bursal surface tear of the supraspinatus
tendon anteriorly at the insertion.
2. Mild acromioclavicular osteoarthritis.

## 2020-02-04 ENCOUNTER — Telehealth: Payer: Self-pay | Admitting: Student in an Organized Health Care Education/Training Program

## 2020-02-04 ENCOUNTER — Ambulatory Visit
Payer: No Typology Code available for payment source | Attending: Student in an Organized Health Care Education/Training Program | Admitting: Student in an Organized Health Care Education/Training Program

## 2020-02-04 ENCOUNTER — Encounter: Payer: Self-pay | Admitting: Student in an Organized Health Care Education/Training Program

## 2020-02-04 ENCOUNTER — Other Ambulatory Visit: Payer: Self-pay

## 2020-02-04 DIAGNOSIS — G894 Chronic pain syndrome: Secondary | ICD-10-CM

## 2020-02-04 DIAGNOSIS — M67912 Unspecified disorder of synovium and tendon, left shoulder: Secondary | ICD-10-CM | POA: Diagnosis not present

## 2020-02-04 DIAGNOSIS — M19012 Primary osteoarthritis, left shoulder: Secondary | ICD-10-CM

## 2020-02-04 DIAGNOSIS — M5412 Radiculopathy, cervical region: Secondary | ICD-10-CM

## 2020-02-04 DIAGNOSIS — M7918 Myalgia, other site: Secondary | ICD-10-CM | POA: Diagnosis not present

## 2020-02-04 DIAGNOSIS — M47812 Spondylosis without myelopathy or radiculopathy, cervical region: Secondary | ICD-10-CM | POA: Diagnosis not present

## 2020-02-04 DIAGNOSIS — M75102 Unspecified rotator cuff tear or rupture of left shoulder, not specified as traumatic: Secondary | ICD-10-CM

## 2020-02-04 MED ORDER — HYDROCODONE-ACETAMINOPHEN 5-325 MG PO TABS: 1.0000 | ORAL_TABLET | Freq: Every evening | ORAL | 0 refills | Status: AC | PRN

## 2020-02-04 NOTE — Telephone Encounter (Signed)
A done over the phone and was approved for 6 months.  Patient  notified.

## 2020-02-04 NOTE — Telephone Encounter (Signed)
Need PA for [employer]co.

## 2020-02-04 NOTE — Progress Notes (Signed)
Patient: Jordan Welch  Service Category: E/M  Provider: [name], MD  DOB: [birth year]  DOS: 02/04/2020  Location: Office  MRN: [id number]  Setting: Ambulatory outpatient  Referring Provider: [facility]  Type: Established Patient  Specialty: Interventional Pain Management  PCP: [facility]  Location: Home  Delivery: TeleHealth     Virtual Encounter - Pain Management PROVIDER NOTE: Information contained herein reflects review and annotations entered in association with encounter. Interpretation of such information and data should be left to medically-trained personnel. Information provided to patient can be located elsewhere in the medical record under "Patient Instructions". Document created using STT-dictation technology, any transcriptional errors that may result from process are unintentional.    Contact & Pharmacy Preferred: [phone] Home: [phone] (home) Mobile: There is no such number on file (mobile). E-mail: [email or web address]  Jordan Welch Phone: [phone] Fax: [phone]  [facility] - [place], [place] - [address] [place] [address] Phone: [phone] Fax: [phone]   Pre-screening  Jordan Welch offered "in-person" vs "virtual" encounter. He indicated preferring virtual for this encounter.   Reason COVID-19*  Social distancing based on CDC and AMA recommendations.   I contacted Jordan Welch on 02/04/2020 via video conference.      I clearly identified myself as Jordan Santa, MD. I verified that I was speaking with the correct person using two identifiers (Name: Jordan Welch, and date of birth: November 12, 1960).  Consent I sought verbal advanced consent from Jordan Welch for virtual visit interactions. I informed Jordan Welch of possible security and privacy concerns, risks, and limitations associated with providing "not-in-person"  medical evaluation and management services. I also informed Jordan Welch of the availability of "in-person" appointments. Finally, I informed him that there would be a charge for the virtual visit and that he could be  personally, fully or partially, financially responsible for it. Jordan Welch expressed understanding and agreed to proceed.   Historic Elements   Jordan Welch is a 59 y.o. year old, male patient evaluated today after his last contact with our practice on 12/14/2019. Jordan Welch  has a past medical history of Anxiety, High cholesterol, and Reflux. He also  has a past surgical history that includes Foot surgery (Right). Jordan Welch has a current medication list which includes the following prescription(s): alfuzosin, atorvastatin, diclofenac, duloxetine, famotidine, gabapentin, hydroxyzine, metformin, pantoprazole, trazodone, and hydrocodone-acetaminophen. He  reports that he has never smoked. He has never used smokeless tobacco. He reports current alcohol use. He reports that he does not use drugs. Jordan Welch has No Known Allergies.   HPI  Today, he is being contacted for Review left shoulder MRI and worsening cervical pain.  Virtual visit today to discuss his left shoulder MRI results and worsening left-sided cervical radicular pain.  He states that he has had difficulty managing his left neck pain that radiates into his left shoulder down his left arm and into his forearm over the last 3 to 4 weeks.  He states that the pain is getting worse.  He is status post left T1-2 ESI on 09/09/2019 which did provide him pain relief for his cervical radicular symptoms of his left upper extremity.    See below for left shoulder MRI results  Laboratory Chemistry Profile   Renal No results found for: BUN, CREATININE, LABCREA, BCR, GFR, GFRAA, GFRNONAA, LABVMA, EPIRU, EPINEPH24HUR, NOREPRU, NOREPI24HUR, DOPARU, [id number]   Hepatic No results  found for: AST, ALT, ALBUMIN, ALKPHOS, HCVAB, AMYLASE, LIPASE, AMMONIA    Electrolytes No results found for: NA, K, CL, CALCIUM, MG, PHOS   Bone No results found for: VD25OH, [id number], [id number], [id number], 25OHVITD1, 25OHVITD2, 25OHVITD3, TESTOFREE, TESTOSTERONE   Inflammation (CRP: Acute Phase) (ESR: Chronic Phase) No results found for: CRP, ESRSEDRATE, LATICACIDVEN     Note: Above Lab results reviewed.   Imaging  MR SHOULDER LEFT WO CONTRAST CLINICAL DATA:  Posterior left shoulder pain radiating down the arm for the past few months. No injury or prior surgery.  EXAM: MRI OF THE LEFT SHOULDER WITHOUT CONTRAST  TECHNIQUE: Multiplanar, multisequence MR imaging of the shoulder was performed. No intravenous contrast was administered.  COMPARISON:  None.  FINDINGS: Rotator cuff: Small partial-thickness bursal surface tear of the supraspinatus tendon anteriorly at the insertion. The infraspinatus, teres minor, and subscapularis tendons are unremarkable.  Muscles: No atrophy or abnormal signal of the muscles of the rotator cuff.  Biceps long head:  Intact and normally positioned.  Acromioclavicular Joint: Mild arthropathy of the acromioclavicular joint. Type II acromion. Trace fluid in the subacromial/subdeltoid bursa.  Glenohumeral Joint: No joint effusion. No chondral defect.  Labrum: Grossly intact, but evaluation is limited by lack of intraarticular fluid.  Bones:  No marrow abnormality, fracture or dislocation.  Other: None.  IMPRESSION: 1. Small partial-thickness bursal surface tear of the supraspinatus tendon anteriorly at the insertion. 2. Mild acromioclavicular osteoarthritis.  Electronically Signed   By: Jordan Welch M.D.   On: 01/23/2020 13:09 Reviewed with patient in detail  Assessment  The primary encounter diagnosis was Dysfunction of left rotator cuff. Diagnoses of Cervical radicular pain, Cervical facet joint syndrome, Myofascial pain, Cervical spondylosis, Chronic pain syndrome, Tear of left supraspinatus  tendon, and Arthrosis of left acromioclavicular joint were also pertinent to this visit.  Plan of Care  Jordan Welch has a current medication list which includes the following long-term medication(s): atorvastatin, duloxetine, famotidine, gabapentin, metformin, pantoprazole, and trazodone.  1. Dysfunction of left rotator cuff -MRI revealing small partial-thickness tear of the supraspinatus tendon anteriorly and mild AC OA, refer to orthopedics - AMB referral to orthopedics  2. Cervical radicular pain -Repeat T1-T2 ESI #3, previously done on 09/09/2019  -Recommend patient increase his gabapentin, currently taking 600 mg nightly, 300 mg during the day.  Recommend he increase to 900 mg nightly and increase his daytime dose to 600 mg - HYDROcodone-acetaminophen (NORCO/VICODIN) 5-325 MG tablet; Take 1-2 tablets by mouth at bedtime as needed for severe pain. Must last 30 days.  Dispense: 30 tablet; Refill: 0 - Cervical Epidural Injection; Future - Ambulatory referral to Neurosurgery  3. Cervical facet joint syndrome - Ambulatory referral to Neurosurgery  4. Myofascial pain -Status post cervical/trapezius trigger point injections on 11/16/2019 which helped. -Continue with stretching exercises  5. Chronic pain syndrome -Increasing gabapentin as above -Continue Cymbalta, p.o. diclofenac as prescribed - HYDROcodone-acetaminophen (NORCO/VICODIN) 5-325 MG tablet; Take 1-2 tablets by mouth at bedtime as needed for severe pain. Must last 30 days.  Dispense: 30 tablet; Refill: 0  6. Tear of left supraspinatus tendon - HYDROcodone-acetaminophen (NORCO/VICODIN) 5-325 MG tablet; Take 1-2 tablets by mouth at bedtime as needed for severe pain. Must last 30 days.  Dispense: 30 tablet; Refill: 0 - AMB referral to orthopedics  7. Arthrosis of left acromioclavicular joint - AMB referral to orthopedics  Pharmacotherapy (Medications Ordered): Meds ordered this encounter  Medications  .  HYDROcodone-acetaminophen (NORCO/VICODIN) 5-325 MG tablet    Sig: Take 1-2 tablets  by mouth at bedtime as needed for severe pain. Must last 30 days.    Dispense:  30 tablet    Refill:  0    Chronic Pain. (STOP Act - Not applicable). Fill one day early if closed on scheduled refill date.   Orders:  Orders Placed This Encounter  Procedures  . Cervical Epidural Injection    Level(s): C7-T1 Laterality: TBD Purpose: Diagnostic/Therapeutic Indication(s): Radiculitis and cervicalgia associater with cervical degenerative disc disease.    Standing Status:   Future    Standing Expiration Date:   03/06/2020    Scheduling Instructions:     Procedure: Cervical Epidural Steroid Injection/Block     Sedation: Patient's choice.     Timeframe: As soon as schedule allows    Order Specific Question:   Where will this procedure be performed?    Answer:   [facility]    Comments:   Ketrina Boateng  . Ambulatory referral to Neurosurgery    Referral Priority:   Routine    Referral Type:   Surgical    Referral Reason:   Specialty Services Required    Referred to Provider:   Meade Maw, MD    Requested Specialty:   Neurosurgery    Number of Visits Requested:   1  . AMB referral to orthopedics    Referral Priority:   Routine    Referral Type:   Consultation    Referred to Provider:   Leim Fabry, MD    Requested Specialty:   Orthopedic Surgery    Number of Visits Requested:   1   Follow-up plan:   Return in about 4 days (around 02/08/2020) for C-ESI #3, without sedation.   Recent Visits Date Type Provider Dept  12/15/19 Telemedicine Jordan Santa, MD [facility]  11/16/19 Procedure visit Jordan Santa, MD [facility]  Showing recent visits within past 90 days and meeting all other requirements Today's Visits Date Type Provider Dept  02/04/20 Telemedicine Jordan Santa, MD [facility]  Showing today's visits and meeting all other requirements Future  Appointments No visits were found meeting these conditions. Showing future appointments within next 90 days and meeting all other requirements  I discussed the assessment and treatment plan with the patient. The patient was provided an opportunity to ask questions and all were answered. The patient agreed with the plan and demonstrated an understanding of the instructions.  Patient advised to call back or seek an in-person evaluation if the symptoms or condition worsens.  Duration of encounter: 30 minutes.  Note by: Jordan Santa, MD Date: 02/04/2020; Time: 11:41 AM

## 2020-02-08 ENCOUNTER — Other Ambulatory Visit: Payer: Self-pay

## 2020-02-08 ENCOUNTER — Ambulatory Visit
Admission: RE | Admit: 2020-02-08 | Discharge: 2020-02-08 | Disposition: A | Discharge status: Home / Self Care | Payer: No Typology Code available for payment source | Source: Ambulatory Visit | Attending: Student in an Organized Health Care Education/Training Program | Admitting: Student in an Organized Health Care Education/Training Program

## 2020-02-08 ENCOUNTER — Ambulatory Visit (HOSPITAL_BASED_OUTPATIENT_CLINIC_OR_DEPARTMENT_OTHER)
Payer: No Typology Code available for payment source | Admitting: Student in an Organized Health Care Education/Training Program

## 2020-02-08 VITALS — BP 141/88 | HR 70 | Temp 97.2°F | Resp 15 | Ht 71.0 in | Wt 240.0 lb

## 2020-02-08 DIAGNOSIS — M5412 Radiculopathy, cervical region: Secondary | ICD-10-CM | POA: Insufficient documentation

## 2020-02-08 DIAGNOSIS — G894 Chronic pain syndrome: Secondary | ICD-10-CM | POA: Insufficient documentation

## 2020-02-08 MED ORDER — IOHEXOL 180 MG/ML  SOLN
INTRAMUSCULAR | Status: AC
  Filled 2020-02-08: qty 20

## 2020-02-08 MED ORDER — LIDOCAINE HCL 2 % IJ SOLN
INTRAMUSCULAR | Status: AC
  Filled 2020-02-08: qty 10

## 2020-02-08 MED ORDER — SODIUM CHLORIDE (PF) 0.9 % IJ SOLN
INTRAMUSCULAR | Status: AC
  Filled 2020-02-08: qty 10

## 2020-02-08 MED ORDER — DEXAMETHASONE SODIUM PHOSPHATE 10 MG/ML IJ SOLN
INTRAMUSCULAR | Status: AC
  Filled 2020-02-08: qty 1

## 2020-02-08 MED ORDER — IOHEXOL 180 MG/ML  SOLN
10.0000 mL | Freq: Once | INTRAMUSCULAR | Status: AC
  Administered 2020-02-08: 10 mL via EPIDURAL

## 2020-02-08 MED ORDER — ROPIVACAINE HCL 2 MG/ML IJ SOLN
INTRAMUSCULAR | Status: AC
  Filled 2020-02-08: qty 10

## 2020-02-08 MED ORDER — LIDOCAINE HCL 2 % IJ SOLN
20.0000 mL | Freq: Once | INTRAMUSCULAR | Status: AC
  Administered 2020-02-08: 200 mg

## 2020-02-08 MED ORDER — SODIUM CHLORIDE 0.9% FLUSH
1.0000 mL | Freq: Once | INTRAVENOUS | Status: AC
  Administered 2020-02-08: 1 mL

## 2020-02-08 MED ORDER — DEXAMETHASONE SODIUM PHOSPHATE 10 MG/ML IJ SOLN
10.0000 mg | Freq: Once | INTRAMUSCULAR | Status: AC
  Administered 2020-02-08: 10 mg

## 2020-02-08 MED ORDER — ROPIVACAINE HCL 2 MG/ML IJ SOLN
1.0000 mL | Freq: Once | INTRAMUSCULAR | Status: AC
  Administered 2020-02-08: 1 mL via EPIDURAL

## 2020-02-08 NOTE — Progress Notes (Signed)
Patient's Name: Jordan Welch  MRN: [id number]  Ref[facility] Provider: [facility]  DOB: 09-21-1960  PCP: [facility]  DOS: 02/08/2020  Note by: Edward Jolly, MD  Service setting: Ambulatory outpatient  Specialty: Interventional Pain Management  Patient type: Established  Location: [facility] (AMB) Pain Management Facility  Visit type: Interventional Procedure   Primary Reason for Visit: Interventional Pain Management Treatment. CC: Neck Pain  Procedure:          Anesthesia, Analgesia, Anxiolysis:  Type: Therapeutic, Inter-Laminar, Cervical Epidural Steroid Injection  #3  Region: Posterior Cervico-thoracic Region Level: T1-2 Laterality: Left-Sided Paramedial  Type: Local Anesthesia  Local Anesthetic: Lidocaine 1-2%  Position: Prone with head of the table was raised to facilitate breathing.   Indications: 1. Cervical radicular pain   2. Chronic pain syndrome    Pain Score: Pre-procedure: 4 /10 Post-procedure: 0-No pain/10   Pre-op Assessment:  Jordan Welch is a 59 y.o. (year old), male patient, seen today for interventional treatment. He  has a past surgical history that includes Foot surgery (Right). Jordan Welch has a current medication list which includes the following prescription(s): alfuzosin, atorvastatin, diclofenac, duloxetine, famotidine, gabapentin, hydrocodone-acetaminophen, hydroxyzine, metformin, pantoprazole, and trazodone. His primarily concern today is the Neck Pain  Initial Vital Signs:  Pulse/HCG Rate: 85  Temp: (!) 97.2 F (36.2 C) Resp: 16 BP: (!) 161/83 SpO2: 100 %  BMI: Estimated body mass index is 33.47 kg/m as calculated from the following:   Height as of this encounter: 5\' 11"  (1.803 m).   Weight as of this encounter: 240 lb (108.9 kg).  Risk Assessment: Allergies: Reviewed. He has No Known Allergies.  Allergy Precautions: None required Coagulopathies: Reviewed. None identified.  Blood-thinner therapy: None at this time Active Infection(s): Reviewed.  None identified. Jordan Welch is afebrile  Site Confirmation: Jordan Welch was asked to confirm the procedure and laterality before marking the site Procedure checklist: Completed Consent: Before the procedure and under the influence of no sedative(s), amnesic(s), or anxiolytics, the patient was informed of the treatment options, risks and possible complications. To fulfill our ethical and legal obligations, as recommended by the American Medical Association's Code of Ethics, I have informed the patient of my clinical impression; the nature and purpose of the treatment or procedure; the risks, benefits, and possible complications of the intervention; the alternatives, including doing nothing; the risk(s) and benefit(s) of the alternative treatment(s) or procedure(s); and the risk(s) and benefit(s) of doing nothing. The patient was provided information about the general risks and possible complications associated with the procedure. These may include, but are not limited to: failure to achieve desired goals, infection, bleeding, organ or nerve damage, allergic reactions, paralysis, and death. In addition, the patient was informed of those risks and complications associated to Spine-related procedures, such as failure to decrease pain; infection (i.e.: Meningitis, epidural or intraspinal abscess); bleeding (i.e.: epidural hematoma, subarachnoid hemorrhage, or any other type of intraspinal or peri-dural bleeding); organ or nerve damage (i.e.: Any type of peripheral nerve, nerve root, or spinal cord injury) with subsequent damage to sensory, motor, and/or autonomic systems, resulting in permanent pain, numbness, and/or weakness of one or several areas of the body; allergic reactions; (i.e.: anaphylactic reaction); and/or death. Furthermore, the patient was informed of those risks and complications associated with the medications. These include, but are not limited to: allergic reactions (i.e.: anaphylactic or  anaphylactoid reaction(s)); adrenal axis suppression; blood sugar elevation that in diabetics may result in ketoacidosis or comma; water retention that in patients  with history of congestive heart failure may result in shortness of breath, pulmonary edema, and decompensation with resultant heart failure; weight gain; swelling or edema; medication-induced neural toxicity; particulate matter embolism and blood vessel occlusion with resultant organ, and/or nervous system infarction; and/or aseptic necrosis of one or more joints. Finally, the patient was informed that Medicine is not an exact science; therefore, there is also the possibility of unforeseen or unpredictable risks and/or possible complications that may result in a catastrophic outcome. The patient indicated having understood very clearly. We have given the patient no guarantees and we have made no promises. Enough time was given to the patient to ask questions, all of which were answered to the patient's satisfaction. Jordan Welch has indicated that he wanted to continue with the procedure. Attestation: I, the ordering provider, attest that I have discussed with the patient the benefits, risks, side-effects, alternatives, likelihood of achieving goals, and potential problems during recovery for the procedure that I have provided informed consent. Date  Time: 02/08/2020 10:31 AM  Pre-Procedure Preparation:  Monitoring: As per clinic protocol. Respiration, ETCO2, SpO2, BP, heart rate and rhythm monitor placed and checked for adequate function Safety Precautions: Patient was assessed for positional comfort and pressure points before starting the procedure. Time-out: I initiated and conducted the "Time-out" before starting the procedure, as per protocol. The patient was asked to participate by confirming the accuracy of the "Time Out" information. Verification of the correct person, site, and procedure were performed and confirmed by me, the nursing staff,  and the patient. "Time-out" conducted as per Joint Commission's Universal Protocol (UP.01.01.01). Time: 1059  Description of Procedure:          Target Area: For Epidural Steroid injections the target is the interlaminar space, initially targeting the lower border of the superior vertebral body lamina. Approach: Paramedial approach. Area Prepped: Entire PosteriorCervical Region Prepping solution: DuraPrep (Iodine Povacrylex [0.7% available iodine] and Isopropyl Alcohol, 74% w/w) Safety Precautions: Aspiration looking for blood return was conducted prior to all injections. At no point did we inject any substances, as a needle was being advanced. No attempts were made at seeking any paresthesias. Safe injection practices and needle disposal techniques used. Medications properly checked for expiration dates. SDV (single dose vial) medications used. Description of the Procedure: Protocol guidelines were followed. The procedure needle was introduced through the skin, ipsilateral to the reported pain, and advanced to the target area. Bone was contacted and the needle walked caudad, until the lamina was cleared. The epidural space was identified using "loss-of-resistance technique" with 2-3 ml of PF-NaCl (0.9% NSS), in a 5cc LOR glass syringe. Vitals:   02/08/20 1050 02/08/20 1100 02/08/20 1110 02/08/20 1116  BP: (!) 168/100 (!) 154/97 (!) 144/90 (!) 141/88  Pulse: 79 80 74 70  Resp: 16 12 11 15   Temp:      TempSrc:      SpO2: 97% 98% 98% 98%  Weight:      Height:        Start Time: 1100 hrs. End Time: 1111 hrs. Materials:  Needle(s) Type: Epidural needle Gauge: 22G Length: 3.5-in Medication(s): Please see orders for medications and dosing details. 5 cc solution made of 2 cc of preservative-free saline, 2 cc of 0.2% ropivacaine, 1 cc of Decadron 10 mg/cc.  Imaging Guidance (Spinal):          Type of Imaging Technique: Fluoroscopy Guidance (Spinal) Indication(s): Assistance in needle guidance  and placement for procedures requiring needle placement in or near specific  anatomical locations not easily accessible without such assistance. Exposure Time: Please see nurses notes. Contrast: Before injecting any contrast, we confirmed that the patient did not have an allergy to iodine, shellfish, or radiological contrast. Once satisfactory needle placement was completed at the desired level, radiological contrast was injected. Contrast injected under live fluoroscopy. No contrast complications. See chart for type and volume of contrast used. Fluoroscopic Guidance: I was personally present during the use of fluoroscopy. "Tunnel Vision Technique" used to obtain the best possible view of the target area. Parallax error corrected before commencing the procedure. "Direction-depth-direction" technique used to introduce the needle under continuous pulsed fluoroscopy. Once target was reached, antero-posterior, oblique, and lateral fluoroscopic projection used confirm needle placement in all planes. Images permanently stored in EMR. Interpretation: I personally interpreted the imaging intraoperatively. Adequate needle placement confirmed in multiple planes. Appropriate spread of contrast into desired area was observed. No evidence of afferent or efferent intravascular uptake. No intrathecal or subarachnoid spread observed. Permanent images saved into the patient's record.  Antibiotic Prophylaxis:   Anti-infectives (From admission, onward)   None     Indication(s): None identified  Post-operative Assessment:  Post-procedure Vital Signs:  Pulse/HCG Rate: 70  Temp: (!) 97.2 F (36.2 C) Resp: 15 BP: (!) 141/88 SpO2: 98 %  EBL: None  Complications: No immediate post-treatment complications observed by team, or reported by patient.  Note: The patient tolerated the entire procedure well. A repeat set of vitals were taken after the procedure and the patient was kept under observation following  institutional policy, for this type of procedure. Post-procedural neurological assessment was performed, showing return to baseline, prior to discharge. The patient was provided with post-procedure discharge instructions, including a section on how to identify potential problems. Should any problems arise concerning this procedure, the patient was given instructions to immediately contact us, at any time, without hesitation. In any case, we plan to contact the patient by telephone for a follow-up status report regarding this interventional procedure.  Comments:  No additional relevant information. 5 out of 5 strength bilateral upper extremity: Shoulder abduction, elbow flexion, elbow extension, thumb extension.  Plan of Care  Orders:  Orders Placed This Encounter  Procedures  . DG PAIN CLINIC C-ARM 1-60 MIN NO REPORT    Intraoperative interpretation by procedural physician at [facility].    Standing Status:   Standing    Number of Occurrences:   1    Order Specific Question:   Reason for exam:    Answer:   Assistance in needle guidance and placement for procedures requiring needle placement in or near specific anatomical locations not easily accessible without such assistance.    Medications ordered for procedure: Meds ordered this encounter  Medications  . iohexol (OMNIPAQUE) 180 MG/ML injection 10 mL    Must be Myelogram-compatible. If not available, you may substitute with a water-soluble, non-ionic, hypoallergenic, myelogram-compatible radiological contrast medium.  Marland Kitchen lidocaine (XYLOCAINE) 2 % (with pres) injection 400 mg  . ropivacaine (PF) 2 mg/mL (0.2%) (NAROPIN) injection 1 mL  . sodium chloride flush (NS) 0.9 % injection 1 mL  . dexamethasone (DECADRON) injection 10 mg   Medications administered: We administered iohexol, lidocaine, ropivacaine (PF) 2 mg/mL (0.2%), sodium chloride flush, and dexamethasone.  See the medical record for exact dosing, route, and time of  administration.  Follow-up plan:   Return in about 5 weeks (around 03/14/2020) for Post Procedure Evaluation, virtual.     Recent Visits Date Type Provider Dept  02/04/20  Telemedicine Edward JollyLateef, Danniela Mcbrearty, MD [facility]  12/15/19 Telemedicine Edward JollyLateef, Keidra Withers, MD [facility]  11/16/19 Procedure visit Edward JollyLateef, Samani Deal, MD [facility]  Showing recent visits within past 90 days and meeting all other requirements Today's Visits Date Type Provider Dept  02/08/20 Procedure visit Edward JollyLateef, Rehan Holness, MD [facility]  Showing today's visits and meeting all other requirements Future Appointments Date Type Provider Dept  03/14/20 Appointment Edward JollyLateef, Dat Derksen, MD [facility]  Showing future appointments within next 90 days and meeting all other requirements  Disposition: Discharge home  Discharge Date & Time: 02/08/2020; 1118 hrs.   Primary [facility] Physician: [facility] Note by: Edward JollyBilal Haydon Dorris, MD Date: 02/08/2020; Time: 12:05 PM  Disclaimer:  Medicine is not an exact science. The only guarantee in medicine is that nothing is guaranteed. It is important to note that the decision to proceed with this intervention was based on the information collected from the patient. The Data and conclusions were drawn from the patient's questionnaire, the interview, and the physical examination. Because the information was provided in large part by the patient, it cannot be guaranteed that it has not been purposely or unconsciously manipulated. Every effort has been made to obtain as much relevant data as possible for this evaluation. It is important to note that the conclusions that lead to this procedure are derived in large part from the available data. Always take into account that the treatment will also be dependent on availability of resources and existing treatment guidelines, considered by other Pain Management Practitioners  as being common knowledge and practice, at the time of the intervention. For Medico-Legal purposes, it is also important to point out that variation in procedural techniques and pharmacological choices are the acceptable norm. The indications, contraindications, technique, and results of the above procedure should only be interpreted and judged by a Board-Certified Interventional Pain Specialist with extensive familiarity and expertise in the same exact procedure and technique.

## 2020-02-08 NOTE — Patient Instructions (Signed)

## 2020-02-08 NOTE — Progress Notes (Signed)
Safety precautions to be maintained throughout the outpatient stay will include: orient to surroundings, keep bed in low position, maintain call bell within reach at all times, provide assistance with transfer out of bed and ambulation.  

## 2020-02-09 ENCOUNTER — Telehealth: Payer: Self-pay | Admitting: *Deleted

## 2020-02-09 NOTE — Telephone Encounter (Signed)
Attempted to call for post procedure follow-up. Message left. 

## 2020-03-01 ENCOUNTER — Telehealth: Payer: Self-pay

## 2020-03-01 DIAGNOSIS — M5412 Radiculopathy, cervical region: Secondary | ICD-10-CM

## 2020-03-01 NOTE — Telephone Encounter (Signed)
DR. Myer Haff saw the patient last week and wants to send him to physical therapy. [facility] the [facility] for [facility] [place] 15 visits at PT so they want [place] to refer him to PT so he will be covered by [place]. She is sending me the notes from that visit as well.

## 2020-03-01 NOTE — Telephone Encounter (Signed)
He saw Dr. Myer Haff last week and they want to send him to PT at [facility] on [address]. In his [place] authorization for the pain clinic it covers 15 PT visits so they want [place] to refer him to PT so the [place] will cover it.

## 2020-03-01 NOTE — Addendum Note (Signed)
Addended by: Edward Jolly on: 03/01/2020 05:09 PM   Modules accepted: Orders

## 2020-03-14 ENCOUNTER — Telehealth
Payer: No Typology Code available for payment source | Admitting: Student in an Organized Health Care Education/Training Program

## 2020-03-17 ENCOUNTER — Encounter: Payer: Self-pay | Admitting: Student in an Organized Health Care Education/Training Program

## 2020-03-17 ENCOUNTER — Telehealth: Payer: Self-pay | Admitting: *Deleted

## 2020-03-17 ENCOUNTER — Other Ambulatory Visit: Payer: Self-pay

## 2020-03-17 ENCOUNTER — Ambulatory Visit
Payer: No Typology Code available for payment source | Attending: Student in an Organized Health Care Education/Training Program | Admitting: Student in an Organized Health Care Education/Training Program

## 2020-03-17 DIAGNOSIS — M5412 Radiculopathy, cervical region: Secondary | ICD-10-CM

## 2020-03-17 DIAGNOSIS — M7918 Myalgia, other site: Secondary | ICD-10-CM | POA: Insufficient documentation

## 2020-03-17 DIAGNOSIS — M67912 Unspecified disorder of synovium and tendon, left shoulder: Secondary | ICD-10-CM | POA: Diagnosis not present

## 2020-03-17 DIAGNOSIS — M51369 Other intervertebral disc degeneration, lumbar region without mention of lumbar back pain or lower extremity pain: Secondary | ICD-10-CM

## 2020-03-17 DIAGNOSIS — M47812 Spondylosis without myelopathy or radiculopathy, cervical region: Secondary | ICD-10-CM

## 2020-03-17 DIAGNOSIS — M75102 Unspecified rotator cuff tear or rupture of left shoulder, not specified as traumatic: Secondary | ICD-10-CM

## 2020-03-17 DIAGNOSIS — G894 Chronic pain syndrome: Secondary | ICD-10-CM

## 2020-03-17 DIAGNOSIS — M5410 Radiculopathy, site unspecified: Secondary | ICD-10-CM

## 2020-03-17 DIAGNOSIS — M5136 Other intervertebral disc degeneration, lumbar region: Secondary | ICD-10-CM

## 2020-03-17 DIAGNOSIS — M47816 Spondylosis without myelopathy or radiculopathy, lumbar region: Secondary | ICD-10-CM

## 2020-03-17 NOTE — Progress Notes (Signed)
Patient: Jordan Welch  Service Category: E/M  Provider: [name], MD  DOB: 03-Nov-1960  DOS: 03/17/2020  Location: Office  MRN: [id number]  Setting: Ambulatory outpatient  Referring Provider: [facility]  Type: Established Patient  Specialty: Interventional Pain Management  PCP: [facility]  Location: Home  Delivery: TeleHealth     Virtual Encounter - Pain Management PROVIDER NOTE: Information contained herein reflects review and annotations entered in association with encounter. Interpretation of such information and data should be left to medically-trained personnel. Information provided to patient can be located elsewhere in the medical record under "Patient Instructions". Document created using STT-dictation technology, any transcriptional errors that may result from process are unintentional.    Contact & Pharmacy Preferred: [phone] Home: [phone] (home) Mobile: There is no such number on file (mobile). E-mail: [email or web address]  [place], [place]. [place] [address] Phone: [phone] Fax: [phone]  [facility] - [place], [place] - [address] [place] [address] Phone: [phone] Fax: [phone]   Pre-screening  Mr. Mabie offered "in-person" vs "virtual" encounter. He indicated preferring virtual for this encounter.   Reason COVID-19*  Social distancing based on CDC and AMA recommendations.   I contacted Ellard Nan on 03/17/2020 via video conference.      I clearly identified myself as [name], MD. I verified that I was speaking with the correct person using two identifiers (Name: Kaelin Bonelli, and date of birth: 09/15/1960).  Consent I sought verbal advanced consent from [name] for virtual visit interactions. I informed Mr. Waterworth of possible security and privacy concerns, risks, and limitations associated with providing "not-in-person"  medical evaluation and management services. I also informed Mr. Sheehan of the availability of "in-person" appointments. Finally, I informed him that there would be a charge for the virtual visit and that he could be  personally, fully or partially, financially responsible for it. Mr. Wigington expressed understanding and agreed to proceed.   Historic Elements   Mr. Azzan Butler is a 59 y.o. year old, male patient evaluated today after his last contact with our practice on 03/01/2020. Mr. Kinzler  has a past medical history of Anxiety, High cholesterol, and Reflux. He also  has a past surgical history that includes Foot surgery (Right). Mr. Preslar has a current medication list which includes the following prescription(s): alfuzosin, atorvastatin, diclofenac, duloxetine, famotidine, hydroxyzine, metformin, pantoprazole, and trazodone. He  reports that he has never smoked. He has never used smokeless tobacco. He reports current alcohol use. He reports that he does not use drugs. Mr. Sievers has No Known Allergies.   HPI  Today, he is being contacted for a post-procedure assessment.   Numbness, tingling on left hand still present, states that over the last couple of weeks he has noticed improvement in his pain.  Did see Dr Cari Caraway on 02/25/20, NSG recommended PT; flexion/extension xrays, they did discuss C6-7 ACDF briefly Did experience pain relief from C-ESI although it was less than previously, interestingly, he does not have any pain right now Is still awaiting PT confirmation, I will place order as well Patient is endorsing low back pain that does radiate into bilateral lower extremity to his proximal thigh more pronounced in the anterior region. Recommend lumbar MRI and then will discuss treatment plan   Laboratory Chemistry Profile   Renal No results found for: BUN, CREATININE, LABCREA, BCR, GFR, GFRAA, GFRNONAA, LABVMA, EPIRU, [id number], NOREPRU, [id number], DOPARU, [id number]  Hepatic No results found  for: AST, ALT, ALBUMIN, ALKPHOS, HCVAB, AMYLASE, LIPASE, AMMONIA   Electrolytes No results found for: NA, K, CL, CALCIUM, MG, PHOS   Bone No results found for: VD25OH, [id number], [id number], [id number], 25OHVITD1, 25OHVITD2, 25OHVITD3, TESTOFREE, TESTOSTERONE   Inflammation (CRP: Acute Phase) (ESR: Chronic Phase) No results found for: CRP, ESRSEDRATE, LATICACIDVEN     Note: Above Lab results reviewed.   Assessment  The primary encounter diagnosis was Cervical radicular pain. Diagnoses of Dysfunction of left rotator cuff, Cervical facet joint syndrome, Myofascial pain, Cervical spondylosis, Tear of left supraspinatus tendon, Chronic pain syndrome, Lumbar degenerative disc disease, Lumbar facet arthropathy, Radicular pain, and Other intervertebral disc degeneration, lumbar region were also pertinent to this visit.  Plan of Care    1. Lumbar MRI without contrast. We will discuss treatment plan afterwards which may include lumbar epidural steroid injection versus diagnostic lumbar facet medial branch nerve blocks. Hip x-rays and SI joint x-rays unremarkable. Suspect that his low back pain that radiates to his lower extremities is coming from his spinal source. Would like to rule out nerve root compression.  2. Referral to PT.  Orders:  Orders Placed This Encounter  Procedures  . MR LUMBAR SPINE WO CONTRAST    Patient presents with axial pain with possible radicular component.  In addition to any acute findings, please report on:  1. Facet (Zygapophyseal) joint DJD (Hypertrophy, space narrowing, subchondral sclerosis, and/or osteophyte formation) 2. DDD and/or IVDD (Loss of disc height, desiccation or "Black disc disease") 3. Pars defects 4. Spondylolisthesis, spondylosis, and/or spondyloarthropathies (include Degree/Grade of displacement in mm) 5. Vertebral body Fractures, including age (old, new/acute) 22. Modic Type Changes 7. Demineralization 8. Bone pathology 9. Central, Lateral  Recess, and/or Foraminal Stenosis (include AP diameter of stenosis in mm) 10. Surgical changes (hardware type, status, and presence of fibrosis)  NOTE: Please specify level(s) and laterality.    Standing Status:   Future    Standing Expiration Date:   06/17/2020    Order Specific Question:   What is the patient's sedation requirement?    Answer:   No Sedation    Order Specific Question:   Does the patient have a pacemaker or implanted devices?    Answer:   No    Order Specific Question:   Preferred imaging location?    Answer:   [facility] (table limit-350lbs)    Order Specific Question:   Call Results- Best Contact Number?    Answer:   [phone] ([facility])    Order Specific Question:   Radiology Contrast Protocol - do NOT remove file path    Answer:   _0 [email or web address]  . Ambulatory referral to Physical Therapy    Referral Priority:   Routine    Referral Type:   Physical Medicine    Referral Reason:   Specialty Services Required    Requested Specialty:   Physical Therapy    Number of Visits Requested:   1   Follow-up plan:   Return for Will call patient to discuss lumbar MRI and subsequent treatment plan.   Recent Visits Date Type Provider Dept  02/08/20 Procedure visit [name], MD [facility]  02/04/20 Telemedicine [name], MD [facility]  Showing recent visits within past 90 days and meeting all other requirements Today's Visits Date Type Provider Dept  03/17/20 Telemedicine [name], MD [facility]  Showing today's visits and meeting all other requirements Future Appointments No visits were found meeting these conditions.  Showing future appointments within next 90 days and meeting all other requirements  I discussed the assessment and treatment plan with the patient. The patient was provided an opportunity to ask questions and all were answered. The patient agreed with the plan  and demonstrated an understanding of the instructions.  Patient advised to call back or seek an in-person evaluation if the symptoms or condition worsens.  Duration of encounter: 42mnutes.  Note by: BGillis Santa MD Date: 03/17/2020; Time: 12:12 PM

## 2020-04-07 ENCOUNTER — Ambulatory Visit: Payer: No Typology Code available for payment source

## 2020-04-07 ENCOUNTER — Telehealth: Payer: Self-pay | Admitting: *Deleted

## 2020-04-11 ENCOUNTER — Ambulatory Visit: Admission: RE | Admit: 2020-04-11 | Payer: No Typology Code available for payment source | Source: Ambulatory Visit

## 2020-04-13 ENCOUNTER — Telehealth: Payer: Self-pay

## 2020-04-13 NOTE — Telephone Encounter (Signed)
[facility] him right now.

## 2020-04-13 NOTE — Telephone Encounter (Signed)
The patient left a message stating the [facility] got everything straightened out and wants to R/S his MRI. I will try to call the [facility] today to verify that he is covered.

## 2020-04-20 ENCOUNTER — Encounter: Payer: Self-pay | Admitting: Student in an Organized Health Care Education/Training Program

## 2020-04-20 ENCOUNTER — Ambulatory Visit
Payer: No Typology Code available for payment source | Attending: Student in an Organized Health Care Education/Training Program | Admitting: Student in an Organized Health Care Education/Training Program

## 2020-04-20 ENCOUNTER — Other Ambulatory Visit: Payer: Self-pay

## 2020-04-20 VITALS — BP 143/86 | HR 73 | Temp 98.2°F | Resp 18 | Ht 71.0 in | Wt 237.0 lb

## 2020-04-20 DIAGNOSIS — M5410 Radiculopathy, site unspecified: Secondary | ICD-10-CM | POA: Diagnosis not present

## 2020-04-20 DIAGNOSIS — G894 Chronic pain syndrome: Secondary | ICD-10-CM | POA: Insufficient documentation

## 2020-04-20 DIAGNOSIS — M5136 Other intervertebral disc degeneration, lumbar region: Secondary | ICD-10-CM | POA: Diagnosis not present

## 2020-04-20 DIAGNOSIS — M47816 Spondylosis without myelopathy or radiculopathy, lumbar region: Secondary | ICD-10-CM | POA: Insufficient documentation

## 2020-04-20 MED ORDER — METHYLPREDNISOLONE 4 MG PO TBPK: ORAL_TABLET | ORAL | 0 refills | Status: AC

## 2020-04-20 NOTE — Progress Notes (Signed)
PROVIDER NOTE: Information contained herein reflects review and annotations entered in association with encounter. Interpretation of such information and data should be left to medically-trained personnel. Information provided to patient can be located elsewhere in the medical record under "Patient Instructions". Document created using STT-dictation technology, any transcriptional errors that may result from process are unintentional.    Patient: Jordan Welch  Service Category: E/M  Provider: [name], MD  DOB: August 05, 1960  DOS: 04/20/2020  Specialty: Interventional Pain Management  MRN: [id number]  Setting: Ambulatory outpatient  PCP: [facility]  Type: Established Patient    Referring Provider: Sisters  Location: Office  Delivery: Face-to-face     HPI  Reason for encounter: Mr. Braylin Xu, a 59 y.o. year old male, is here today for evaluation and management of his Lumbar facet arthropathy [M47.816]. Mr. Wanat primary complain today is Back Pain (low back left side) Last encounter: Practice (04/13/2020). My last encounter with him was on 02/08/2020. Pertinent problems: Mr. 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 Pain Assessment: Severity of Chronic pain is reported as a 4 /10. Location: Back Lower, Left/a little bit into the top of the left buttocks and occassionally pain down the left outside of leg. Onset: More than a month ago. Quality: Constant, Discomfort, Numbness, Tingling. Timing: Constant. Modifying factor(s): getting off feet. Vitals:  height is '5\' 11"'  (1.803 m) and weight is 237 lb (107.5 kg). His temporal temperature is 98.2 F (36.8 C). His blood  pressure is 143/86 (abnormal) and his pulse is 73. His respiration is 18 and oxygen saturation is 74% (abnormal).   Mr. Clymer presents today with persistent, severe low back pain localized to his left lower lumbar region overlying the L4-L5 area with occasional radiation in a non dermatomal fashion to his left hip and left posterior lateral thigh.  Patient states that this pain was very severe over the last weekend.  Of note, at the patient's last visit, a lumbar MRI was ordered however due to insurance issues, for some reason it was canceled.  Patient was very frustrated with this.  I will reorder a lumbar MRI as I think this is important to obtain so that we can assess for lumbar spondylosis, disc herniation, nerve compression that could be contributing to his symptoms.  Of note he has been seen by Dr. Cari Caraway, neurosurgery who did not recommend surgical intervention at this time.  In regards to his neck pain related to cervical facet joint syndrome and cervical spondylosis and cervical foraminal stenosis at right C5-C6 and right C6-C7, that is doing well.  He has satisfactory range of motion and his pain is well managed in that region.  ROS  Constitutional: Denies any fever or chills Gastrointestinal: No reported hemesis, hematochezia, vomiting, or acute GI distress Musculoskeletal: Left low lumbar pain Neurological: No reported episodes of acute onset apraxia, aphasia, dysarthria, agnosia, amnesia, paralysis, loss of coordination, or loss of consciousness  Medication Review  DULoxetine, HYDROcodone-acetaminophen, alfuzosin, atorvastatin, diclofenac, famotidine, hydrOXYzine, metFORMIN, methylPREDNISolone, pantoprazole, psyllium, sildenafil, tiZANidine, traZODone, and venlafaxine  History Review  Allergy: Mr. Echeverri has No Known Allergies. Drug: Mr. Bentsen  reports no history of drug use. Alcohol:  reports current alcohol use. Tobacco:  reports that he has  never smoked. He has never used smokeless  tobacco. Social: Mr. Steil  reports that he has never smoked. He has never used smokeless tobacco. He reports current alcohol use. He reports that he does not use drugs. Medical:  has a past medical history of Anxiety, High cholesterol, and Reflux. Surgical: Mr. Aldredge  has a past surgical history that includes Foot surgery (Right). Family: family history is not on file.  Laboratory Chemistry Profile   Renal No results found for: BUN, CREATININE, LABCREA, BCR, GFR, GFRAA, GFRNONAA, LABVMA, EPIRU, EPINEPH24HUR, NOREPRU, NOREPI24HUR, DOPARU, [id number]   Hepatic No results found for: AST, ALT, ALBUMIN, ALKPHOS, HCVAB, AMYLASE, LIPASE, AMMONIA   Electrolytes No results found for: NA, K, CL, CALCIUM, MG, PHOS   Bone No results found for: VD25OH, [id number], [id number], [id number], 25OHVITD1, 25OHVITD2, 25OHVITD3, TESTOFREE, TESTOSTERONE   Inflammation (CRP: Acute Phase) (ESR: Chronic Phase) No results found for: CRP, ESRSEDRATE, LATICACIDVEN     Note: Above Lab results reviewed.   Physical Exam  General appearance: Well nourished, well developed, and well hydrated. In no apparent acute distress Mental status: Alert, oriented x 3 (person, place, & time)       Respiratory: No evidence of acute respiratory distress Eyes: PERLA Vitals: BP (!) 143/86 (BP Location: Right Arm, Patient Position: Sitting, Cuff Size: Normal)   Pulse 73   Temp 98.2 F (36.8 C) (Temporal)   Resp 18   Ht '5\' 11"'  (1.803 m)   Wt 237 lb (107.5 kg)   SpO2 (!) 74%   BMI 33.05 kg/m  BMI: Estimated body mass index is 33.05 kg/m as calculated from the following:   Height as of this encounter: '5\' 11"'  (1.803 m).   Weight as of this encounter: 237 lb (107.5 kg). Ideal: Ideal body weight: 75.3 kg (166 lb 0.1 oz) Adjusted ideal body weight: 88.2 kg (194 lb 6.5 oz)  Lumbar Spine Area Exam  Skin & Axial Inspection: No masses, redness, or swelling Alignment: Symmetrical Functional ROM: Pain restricted ROM affecting  primarily the left Stability: No instability detected Muscle Tone/Strength: Functionally intact. No obvious neuro-muscular anomalies detected. Sensory (Neurological): Musculoskeletal pain pattern Palpation: Complains of area being tender to palpation       Provocative Tests: Hyperextension/rotation test: (+) bilaterally for facet joint pain. Lumbar quadrant test (Kemp's test): (+) on the left for foraminal stenosis Lateral bending test: (+) ipsilateral radicular pain, on the left. Positive for left-sided foraminal stenosis.  Gait & Posture Assessment  Ambulation: Unassisted Gait: Relatively normal for age and body habitus Posture: WNL  Lower Extremity Exam    Side: Right lower extremity  Side: Left lower extremity  Stability: No instability observed          Stability: No instability observed          Skin & Extremity Inspection: Skin color, temperature, and hair growth are WNL. No peripheral edema or cyanosis. No masses, redness, swelling, asymmetry, or associated skin lesions. No contractures.  Skin & Extremity Inspection: Skin color, temperature, and hair growth are WNL. No peripheral edema or cyanosis. No masses, redness, swelling, asymmetry, or associated skin lesions. No contractures.  Functional ROM: Pain restricted ROM for hip joint          Functional ROM: Unrestricted ROM                  Muscle Tone/Strength: Functionally intact. No obvious neuro-muscular anomalies detected.  Muscle Tone/Strength: Functionally intact. No obvious neuro-muscular anomalies detected.  Sensory (Neurological): Unimpaired  Sensory (Neurological): Unimpaired        DTR: Patellar: deferred today Achilles: deferred today Plantar: deferred today  DTR: Patellar: deferred today Achilles: deferred today Plantar: deferred today  Palpation: No palpable anomalies  Palpation: No palpable anomalies    Assessment   Status Diagnosis  Persistent Persistent Persistent 1. Lumbar facet arthropathy   2.  Lumbar degenerative disc disease   3. Radicular pain   4. Chronic pain syndrome   5. 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 Rohit Deloria has a current medication list which includes the following long-term medication(s): atorvastatin, duloxetine, famotidine, metformin, pantoprazole, sildenafil, trazodone, and venlafaxine.   Persistent low back pain that is causing disability and trouble performing ADLs.  Recommend lumbar MRI without contrast to evaluate for spondylosis, listhesis, nerve compression, disc herniation.  Will discuss treatment plan with patient after lumbar MRI.  Steroid taper as below if the patient does have further exacerbation of his low back pain.  Future considerations could include spinal injections based upon lumbar MRI.  Pharmacotherapy (Medications Ordered): Meds ordered this encounter  Medications  . methylPREDNISolone (MEDROL) 4 MG TBPK tablet    Sig: Follow package instructions.    Dispense:  21 tablet    Refill:  0    Do not add to the "Automatic Refill" notification system.   Orders:  Orders Placed This Encounter  Procedures  . MR LUMBAR SPINE WO CONTRAST    Patient presents with axial pain with possible radicular component.  In addition to any acute findings, please report on:  1. Facet (Zygapophyseal) joint DJD (Hypertrophy, space narrowing, subchondral sclerosis, and/or osteophyte formation) 2. DDD and/or IVDD (Loss of disc height, desiccation or "Black disc disease") 3. Pars defects 4. Spondylolisthesis, spondylosis, and/or spondyloarthropathies  (include Degree/Grade of displacement in mm) 5. Vertebral body Fractures, including age (old, new/acute) 21. Modic Type Changes 7. Demineralization 8. Bone pathology 9. Central, Lateral Recess, and/or Foraminal Stenosis (include AP diameter of stenosis in mm) 10. Surgical changes (hardware type, status, and presence of fibrosis)  NOTE: Please specify level(s) and laterality.    Standing Status:   Future    Standing Expiration Date:   07/20/2020    Order Specific Question:   What is the patient's sedation requirement?    Answer:   No Sedation    Order Specific Question:   Does the patient have a pacemaker or implanted devices?    Answer:   No    Order Specific Question:   Preferred imaging location?    Answer:   [facility] (table limit-350lbs)    Order Specific Question:   Call Results- Best Contact Number?    Answer:   [phone] ([facility])    Order Specific Question:   Radiology Contrast Protocol - do NOT remove file path    Answer:   [email or web address]   Follow-up plan:   Return for will call to discuss MRI results and treatment plan after imaging.       Recent Visits Date Type Provider Dept  03/17/20 Telemedicine [name], MD [facility]  02/08/20 Procedure visit  [name], MD [facility]  02/04/20 Telemedicine [name], MD [facility]  Showing recent visits within past 90 days and meeting all other requirements Today's Visits Date Type Provider Dept  04/20/20 Office Visit [name], MD [facility]  Showing today's visits and meeting all other requirements Future Appointments No visits were found meeting these conditions. Showing future appointments within next 90 days and meeting all other requirements  I discussed the assessment and treatment plan with the patient. The patient was provided an opportunity to ask questions and all were answered. The patient agreed with  the plan and demonstrated an understanding of the instructions.  Patient advised to call back or seek an in-person evaluation if the symptoms or condition worsens.  Duration of encounter: 30 minutes.  Note by: [name], MD Date: 04/20/2020; Time: 2:18 PM

## 2020-04-20 NOTE — Progress Notes (Signed)
Nursing Pain Medication Assessment:  Safety precautions to be maintained throughout the outpatient stay will include: orient to surroundings, keep bed in low position, maintain call bell within reach at all times, provide assistance with transfer out of bed and ambulation.  Medication Inspection Compliance: Jordan Welch did not comply with our request to bring his pills to be counted. He was reminded that bringing the medication bottles, even when empty, is a requirement.  Medication: None brought in. Pill/Patch Count: None available to be counted. Bottle Appearance: No container available. Did not bring bottle(s) to appointment. Filled Date: N/A Last Medication intake:  maybe saturday evening

## 2020-05-06 ENCOUNTER — Ambulatory Visit
Admission: RE | Admit: 2020-05-06 | Discharge: 2020-05-06 | Disposition: A | Discharge status: Home / Self Care | Payer: No Typology Code available for payment source | Source: Ambulatory Visit | Attending: Student in an Organized Health Care Education/Training Program | Admitting: Student in an Organized Health Care Education/Training Program

## 2020-05-06 ENCOUNTER — Other Ambulatory Visit: Payer: Self-pay

## 2020-05-06 DIAGNOSIS — M5136 Other intervertebral disc degeneration, lumbar region: Secondary | ICD-10-CM | POA: Insufficient documentation

## 2020-05-06 DIAGNOSIS — M47816 Spondylosis without myelopathy or radiculopathy, lumbar region: Secondary | ICD-10-CM | POA: Diagnosis not present

## 2020-05-06 DIAGNOSIS — M5410 Radiculopathy, site unspecified: Secondary | ICD-10-CM | POA: Insufficient documentation

## 2020-05-06 IMAGING — MR MR LUMBAR SPINE W/O CM
5 series · 31 of 48 positions shown · non-contrast
Comparison: None.

CLINICAL DATA: Osteoarthritis; lumbosacral low back pain, > 6 wks.
Lumbar radiculopathy.

EXAM:
MRI LUMBAR SPINE WITHOUT CONTRAST
TECHNIQUE: Multiplanar, multisequence MR imaging of the lumbar spine was
performed. No intravenous contrast was administered.

[Series 5: T2 · sagittal · 4.0mm · 0.81mm/px · 6 of 17 slices shown (1 of 2)]
[im 1/17]
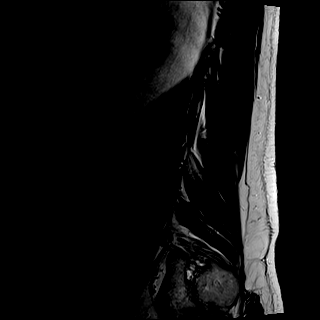
[im 4/17]
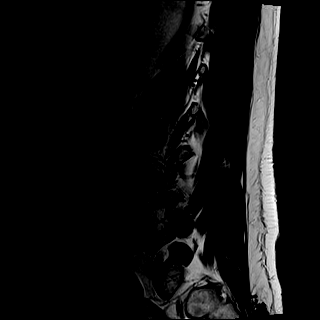
[im 7/17]
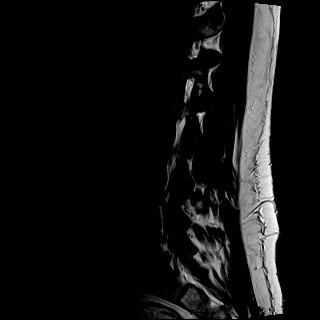
[im 10/17]
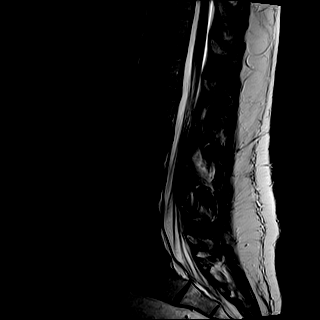
[im 13/17]
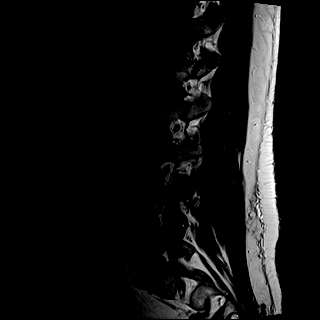
[im 17/17]
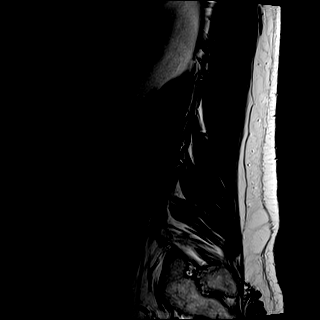

[Series 6: T1 · sagittal · 4.0mm · 0.81mm/px · 7 of 17 slices shown (1 of 2)]
[im 1/17]
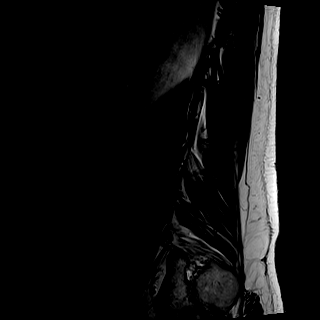
[im 3/17]
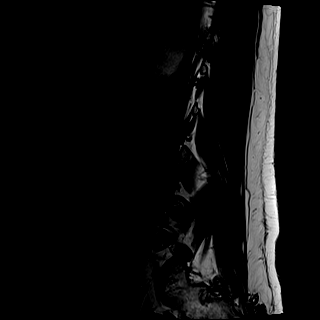
[im 6/17]
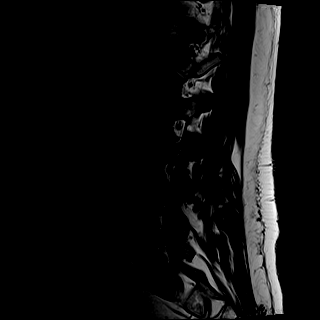
[im 9/17]
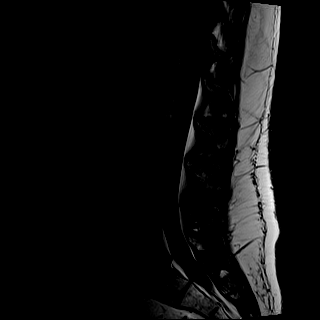
[im 11/17]
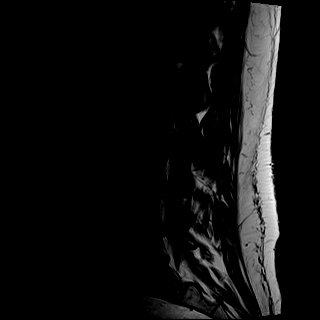
[im 14/17]
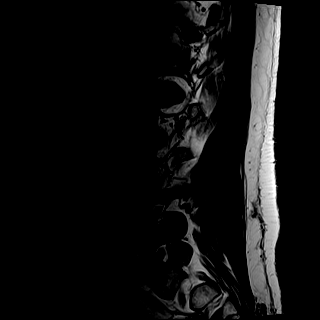
[im 17/17]
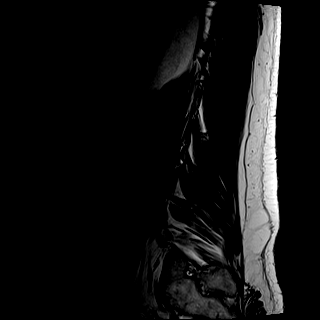

[Series 7: STIR · sagittal · 4.0mm · 0.41mm/px · 2 of 17 slices shown]
[im 1/17]
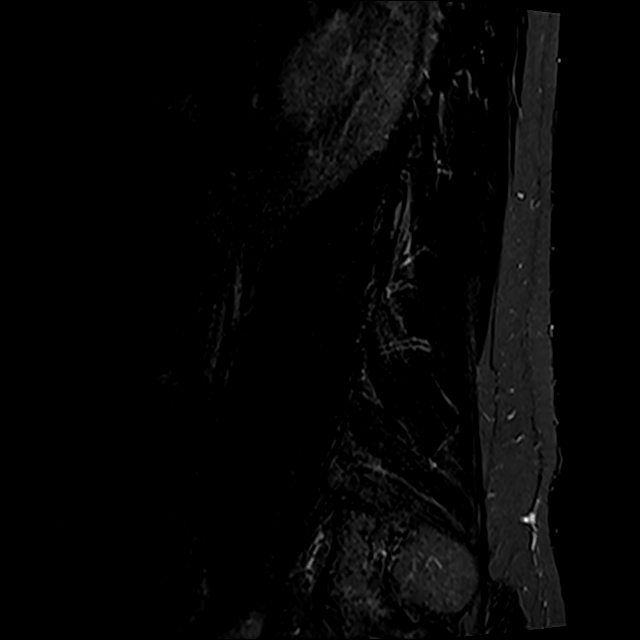
[im 3/17]
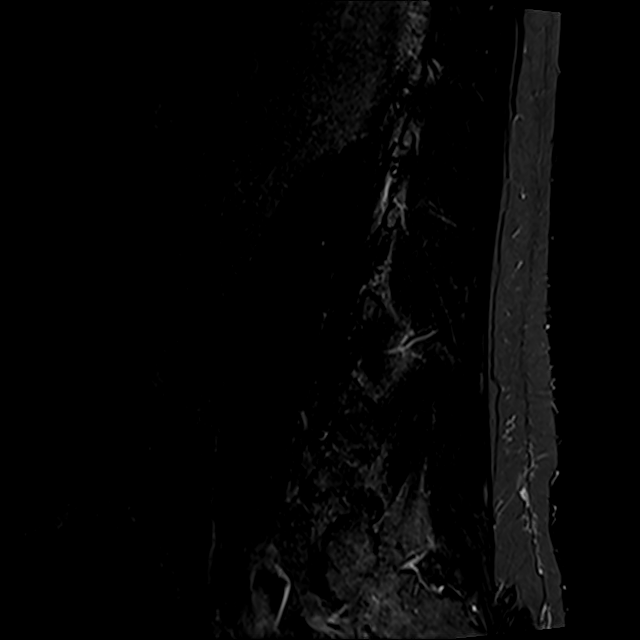

[Series 8: T2 · axial · 4.0mm · 0.78mm/px · z∈[-102,+123]mm · 8 of 36 slices shown (2 of 2)]
[im 1/36]
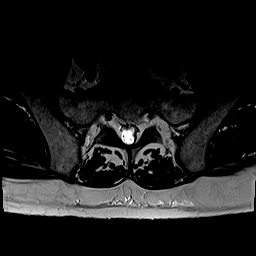
[im 6/36]
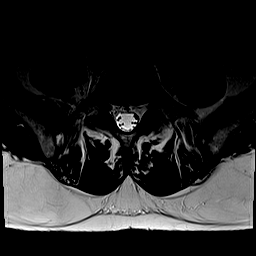
[im 11/36]
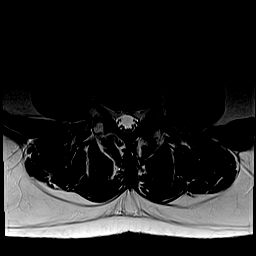
[im 17/36]
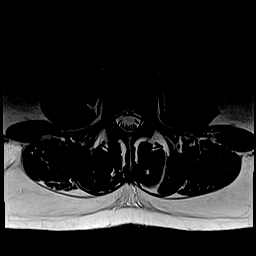
[im 19/36]
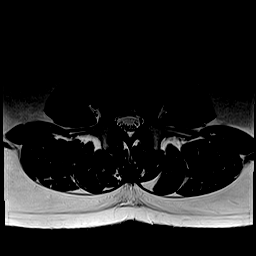
[im 25/36]
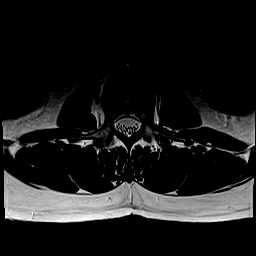
[im 30/36]
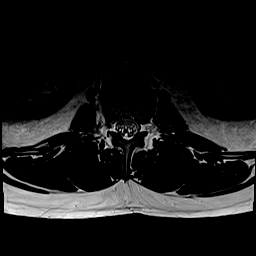
[im 36/36]
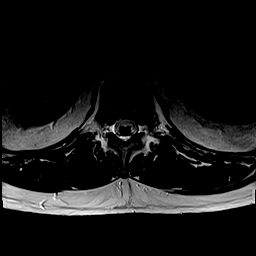

[Series 9: T1 · axial · 4.0mm · 0.39mm/px · z∈[-102,+123]mm · 8 of 36 slices shown (2 of 2)]
[im 1/36]
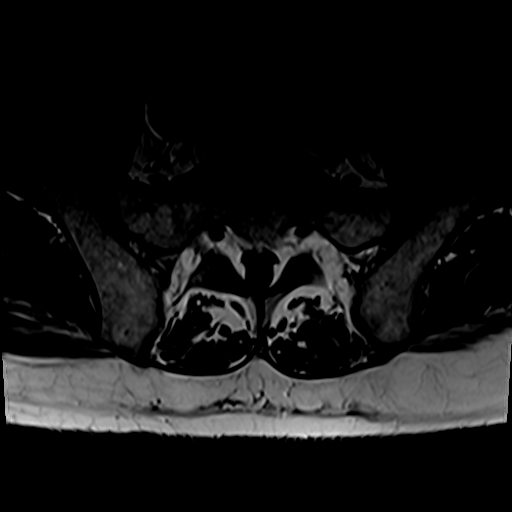
[im 6/36]
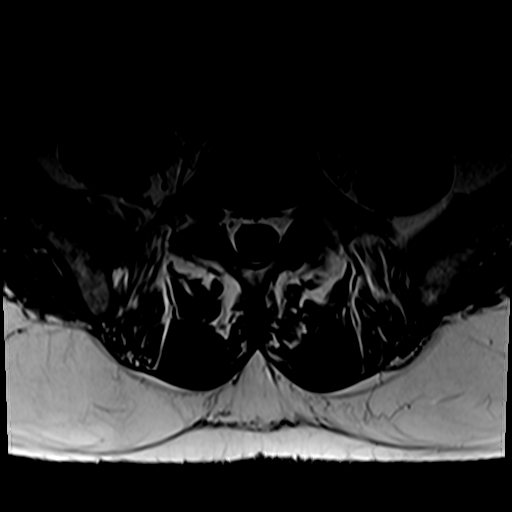
[im 11/36]
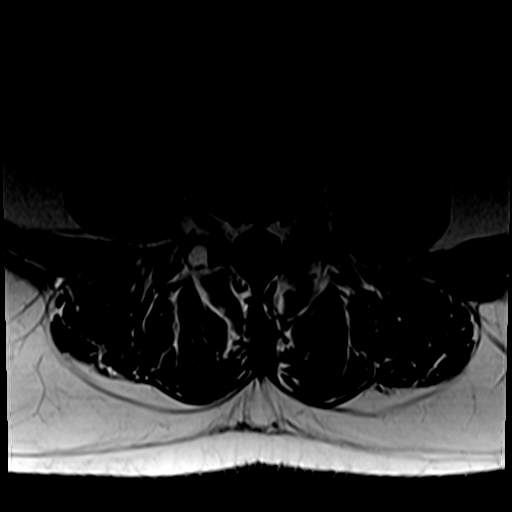
[im 17/36]
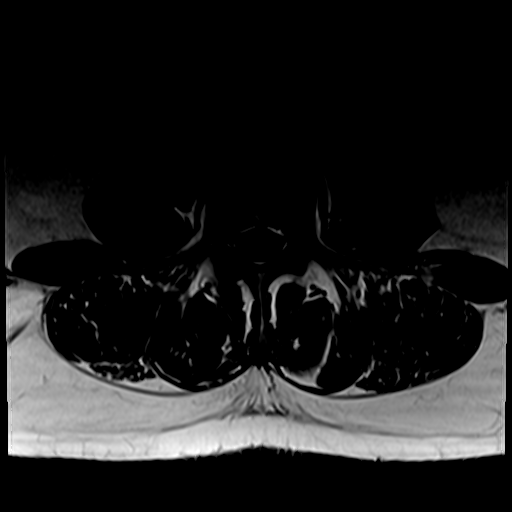
[im 19/36]
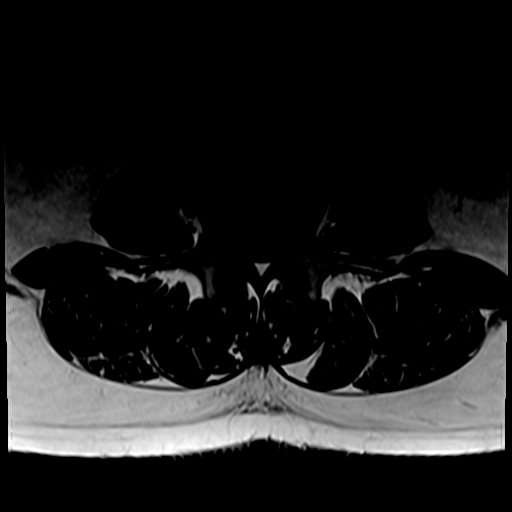
[im 25/36]
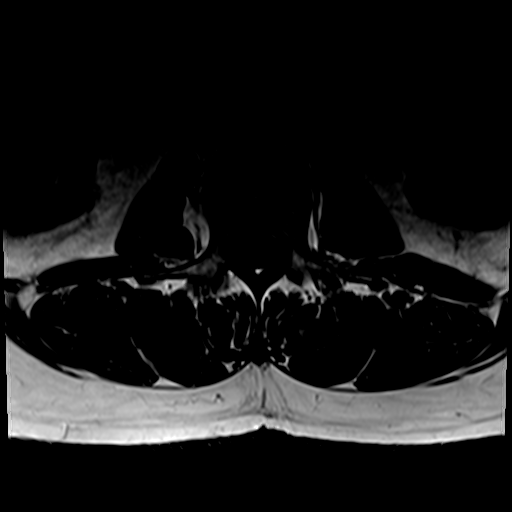
[im 30/36]
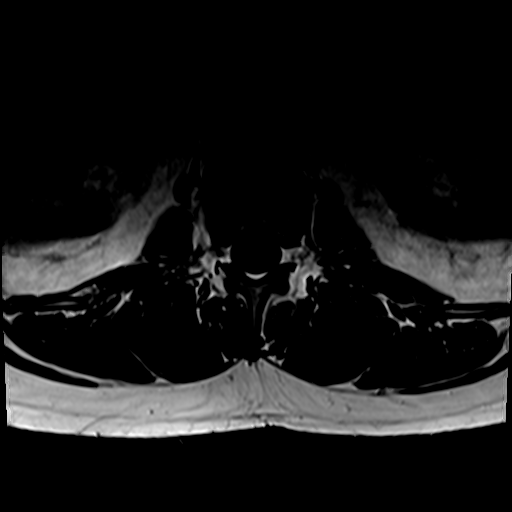
[im 36/36]
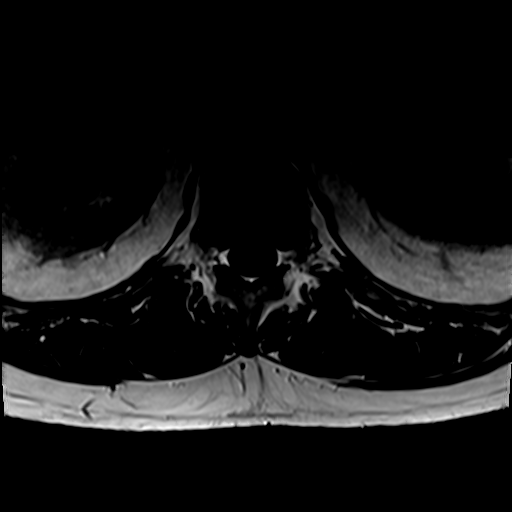

[31 of 48 positions shown; findings below may reference images not displayed]

FINDINGS: Segmentation:  Standard.

Alignment:  Physiologic.

Vertebrae:  No fracture, evidence of discitis, or bone lesion.

Conus medullaris and cauda equina: Conus extends to the L1 level.
Conus and cauda equina appear normal.

Paraspinal and other soft tissues: Negative.

Disc levels:

T12-L1: No spinal canal or neural foraminal stenosis.

L1-2: Mild loss of disc height and shallow disc bulge without
significant spinal canal or neural foraminal stenosis.

L2-3: No spinal canal or neural foraminal stenosis.

L3-4: Shallow disc bulge mild facet degenerative changes. No spinal
canal or neural foraminal stenosis.

L4-5: Shallow disc bulge and facet degenerative changes. No spinal
canal or neural foraminal stenosis.

L5-S1: Tiny left central/subarticular disc protrusion and mild facet
degenerative changes. No spinal canal or neural foraminal stenosis.
IMPRESSION: Mild multilevel degenerative changes of the lumbar spine. No
significant spinal canal or neural foraminal stenosis at any level.

## 2020-05-16 ENCOUNTER — Telehealth: Payer: Self-pay

## 2020-05-16 NOTE — Telephone Encounter (Signed)
He wants to get the results from his MRI. Dr. Cherylann Ratel doesn't have any appt open until 12/14 unless its a virtual but he works second shift and cant do a virtual. Can a nurse call him with the results?

## 2020-05-16 NOTE — Telephone Encounter (Signed)
Attempted to call patient, message left. 

## 2020-05-18 NOTE — Telephone Encounter (Signed)
Attempted to call patient, message left. 

## 2020-05-19 ENCOUNTER — Telehealth: Payer: Self-pay | Admitting: Student in an Organized Health Care Education/Training Program

## 2020-05-19 NOTE — Telephone Encounter (Signed)
Patient returned Jordan Welch's call for results of his MRI  His work schedule makes it difficult to get calls   If someone can call between 11:30 - 12:30 or 1:00 - 2:30 he would really appreciate it.

## 2020-05-19 NOTE — Telephone Encounter (Signed)
MRI results read to patient. 

## 2020-06-07 ENCOUNTER — Ambulatory Visit: Payer: No Typology Code available for payment source | Attending: Internal Medicine

## 2020-06-07 ENCOUNTER — Other Ambulatory Visit: Payer: Self-pay | Admitting: Internal Medicine

## 2020-06-07 DIAGNOSIS — Z23 Encounter for immunization: Secondary | ICD-10-CM

## 2020-06-07 NOTE — Progress Notes (Signed)
   Covid-19 Vaccination Clinic  Name:  Jordan Welch    MRN: [id number] DOB: 08/19/1960  06/07/2020  Jordan Welch was observed post Covid-19 immunization for 15 minutes without incident. He was provided with Vaccine Information Sheet and instruction to access the V-Safe system.   Jordan Welch was instructed to call 911 with any severe reactions post vaccine: Marland Kitchen Difficulty breathing  . Swelling of face and throat  . A fast heartbeat  . A bad rash all over body  . Dizziness and weakness   Immunizations Administered    Name Date Dose VIS Date Route   Pfizer COVID-19 Vaccine 06/07/2020 11:43 AM 0.3 mL 05/11/2020 Intramuscular   Manufacturer: [employer], [employer]   Lot: [id number]   NDC: [address]-7

## 2020-07-04 ENCOUNTER — Encounter: Payer: Self-pay | Admitting: Student in an Organized Health Care Education/Training Program

## 2020-07-04 ENCOUNTER — Other Ambulatory Visit: Payer: Self-pay

## 2020-07-04 ENCOUNTER — Ambulatory Visit
Payer: No Typology Code available for payment source | Attending: Student in an Organized Health Care Education/Training Program | Admitting: Student in an Organized Health Care Education/Training Program

## 2020-07-04 VITALS — BP 143/86 | HR 85 | Temp 96.5°F | Resp 18 | Ht 71.0 in | Wt 237.0 lb

## 2020-07-04 DIAGNOSIS — M5136 Other intervertebral disc degeneration, lumbar region: Secondary | ICD-10-CM | POA: Diagnosis not present

## 2020-07-04 DIAGNOSIS — M5416 Radiculopathy, lumbar region: Secondary | ICD-10-CM | POA: Insufficient documentation

## 2020-07-04 DIAGNOSIS — M5410 Radiculopathy, site unspecified: Secondary | ICD-10-CM | POA: Insufficient documentation

## 2020-07-04 DIAGNOSIS — G894 Chronic pain syndrome: Secondary | ICD-10-CM | POA: Diagnosis present

## 2020-07-04 DIAGNOSIS — M7918 Myalgia, other site: Secondary | ICD-10-CM | POA: Insufficient documentation

## 2020-07-04 NOTE — Patient Instructions (Signed)
GENERAL RISKS AND COMPLICATIONS  What are the risk, side effects and possible complications? Generally speaking, most procedures are safe.  However, with any procedure there are risks, side effects, and the possibility of complications.  The risks and complications are dependent upon the sites that are lesioned, or the type of nerve block to be performed.  The closer the procedure is to the spine, the more serious the risks are.  Great care is taken when placing the radio frequency needles, block needles or lesioning probes, but sometimes complications can occur. 1. Infection: Any time there is an injection through the skin, there is a risk of infection.  This is why sterile conditions are used for these blocks.  There are four possible types of infection. 1. Localized skin infection. 2. Central Nervous System Infection-This can be in the form of Meningitis, which can be deadly. 3. Epidural Infections-This can be in the form of an epidural abscess, which can cause pressure inside of the spine, causing compression of the spinal cord with subsequent paralysis. This would require an emergency surgery to decompress, and there are no guarantees that the patient would recover from the paralysis. 4. Discitis-This is an infection of the intervertebral discs.  It occurs in about 1% of discography procedures.  It is difficult to treat and it may lead to surgery.        2. Pain: the needles have to go through skin and soft tissues, will cause soreness.       3. Damage to internal structures:  The nerves to be lesioned may be near blood vessels or    other nerves which can be potentially damaged.       4. Bleeding: Bleeding is more common if the patient is taking blood thinners such as  aspirin, Coumadin, Ticiid, Plavix, etc., or if he/she have some genetic predisposition  such as hemophilia. Bleeding into the spinal canal can cause compression of the spinal  cord with subsequent paralysis.  This would require an  emergency surgery to  decompress and there are no guarantees that the patient would recover from the  paralysis.       5. Pneumothorax:  Puncturing of a lung is a possibility, every time a needle is introduced in  the area of the chest or upper back.  Pneumothorax refers to free air around the  collapsed lung(s), inside of the thoracic cavity (chest cavity).  Another two possible  complications related to a similar event would include: Hemothorax and Chylothorax.   These are variations of the Pneumothorax, where instead of air around the collapsed  lung(s), you may have blood or chyle, respectively.       6. Spinal headaches: They may occur with any procedures in the area of the spine.       7. Persistent CSF (Cerebro-Spinal Fluid) leakage: This is a rare problem, but may occur  with prolonged intrathecal or epidural catheters either due to the formation of a fistulous  track or a dural tear.       8. Nerve damage: By working so close to the spinal cord, there is always a possibility of  nerve damage, which could be as serious as a permanent spinal cord injury with  paralysis.       9. Death:  Although rare, severe deadly allergic reactions known as "Anaphylactic  reaction" can occur to any of the medications used.      10. Worsening of the symptoms:  We can always make thing worse.    What are the chances of something like this happening? Chances of any of this occuring are extremely low.  By statistics, you have more of a chance of getting killed in a motor vehicle accident: while driving to the hospital than any of the above occurring .  Nevertheless, you should be aware that they are possibilities.  In general, it is similar to taking a shower.  Everybody knows that you can slip, hit your head and get killed.  Does that mean that you should not shower again?  Nevertheless always keep in mind that statistics do not mean anything if you happen to be on the wrong side of them.  Even if a procedure has a 1  (one) in a 1,000,000 (million) chance of going wrong, it you happen to be that one..Also, keep in mind that by statistics, you have more of a chance of having something go wrong when taking medications.  Who should not have this procedure? If you are on a blood thinning medication (e.g. Coumadin, Plavix, see list of "Blood Thinners"), or if you have an active infection going on, you should not have the procedure.  If you are taking any blood thinners, please inform your physician.  How should I prepare for this procedure?  Do not eat or drink anything at least six hours prior to the procedure.  Bring a driver with you .  It cannot be a taxi.  Come accompanied by an adult that can drive you back, and that is strong enough to help you if your legs get weak or numb from the local anesthetic.  Take all of your medicines the morning of the procedure with just enough water to swallow them.  If you have diabetes, make sure that you are scheduled to have your procedure done first thing in the morning, whenever possible.  If you have diabetes, take only half of your insulin dose and notify our nurse that you have done so as soon as you arrive at the clinic.  If you are diabetic, but only take blood sugar pills (oral hypoglycemic), then do not take them on the morning of your procedure.  You may take them after you have had the procedure.  Do not take aspirin or any aspirin-containing medications, at least eleven (11) days prior to the procedure.  They may prolong bleeding.  Wear loose fitting clothing that may be easy to take off and that you would not mind if it got stained with Betadine or blood.  Do not wear any jewelry or perfume  Remove any nail coloring.  It will interfere with some of our monitoring equipment.  NOTE: Remember that this is not meant to be interpreted as a complete list of all possible complications.  Unforeseen problems may occur.  BLOOD THINNERS The following drugs  contain aspirin or other products, which can cause increased bleeding during surgery and should not be taken for 2 weeks prior to and 1 week after surgery.  If you should need take something for relief of minor pain, you may take acetaminophen which is found in Tylenol,m Datril, Anacin-3 and Panadol. It is not blood thinner. The products listed below are.  Do not take any of the products listed below in addition to any listed on your instruction sheet.  A.P.C or A.P.C with Codeine Codeine Phosphate Capsules #3 Ibuprofen Ridaura  ABC compound Congesprin Imuran rimadil  Advil Cope Indocin Robaxisal  Alka-Seltzer Effervescent Pain Reliever and Antacid Coricidin or Coricidin-D  Indomethacin Rufen    Alka-Seltzer plus Cold Medicine Cosprin Ketoprofen S-A-C Tablets  Anacin Analgesic Tablets or Capsules Coumadin Korlgesic Salflex  Anacin Extra Strength Analgesic tablets or capsules CP-2 Tablets Lanoril Salicylate  Anaprox Cuprimine Capsules Levenox Salocol  Anexsia-D Dalteparin Magan Salsalate  Anodynos Darvon compound Magnesium Salicylate Sine-off  Ansaid Dasin Capsules Magsal Sodium Salicylate  Anturane Depen Capsules Marnal Soma  APF Arthritis pain formula Dewitt's Pills Measurin Stanback  Argesic Dia-Gesic Meclofenamic Sulfinpyrazone  Arthritis Bayer Timed Release Aspirin Diclofenac Meclomen Sulindac  Arthritis pain formula Anacin Dicumarol Medipren Supac  Analgesic (Safety coated) Arthralgen Diffunasal Mefanamic Suprofen  Arthritis Strength Bufferin Dihydrocodeine Mepro Compound Suprol  Arthropan liquid Dopirydamole Methcarbomol with Aspirin Synalgos  ASA tablets/Enseals Disalcid Micrainin Tagament  Ascriptin Doan's Midol Talwin  Ascriptin A/D Dolene Mobidin Tanderil  Ascriptin Extra Strength Dolobid Moblgesic Ticlid  Ascriptin with Codeine Doloprin or Doloprin with Codeine Momentum Tolectin  Asperbuf Duoprin Mono-gesic Trendar  Aspergum Duradyne Motrin or Motrin IB Triminicin  Aspirin  plain, buffered or enteric coated Durasal Myochrisine Trigesic  Aspirin Suppositories Easprin Nalfon Trillsate  Aspirin with Codeine Ecotrin Regular or Extra Strength Naprosyn Uracel  Atromid-S Efficin Naproxen Ursinus  Auranofin Capsules Elmiron Neocylate Vanquish  Axotal Emagrin Norgesic Verin  Azathioprine Empirin or Empirin with Codeine Normiflo Vitamin E  Azolid Emprazil Nuprin Voltaren  Bayer Aspirin plain, buffered or children's or timed BC Tablets or powders Encaprin Orgaran Warfarin Sodium  Buff-a-Comp Enoxaparin Orudis Zorpin  Buff-a-Comp with Codeine Equegesic Os-Cal-Gesic   Buffaprin Excedrin plain, buffered or Extra Strength Oxalid   Bufferin Arthritis Strength Feldene Oxphenbutazone   Bufferin plain or Extra Strength Feldene Capsules Oxycodone with Aspirin   Bufferin with Codeine Fenoprofen Fenoprofen Pabalate or Pabalate-SF   Buffets II Flogesic Panagesic   Buffinol plain or Extra Strength Florinal or Florinal with Codeine Panwarfarin   Buf-Tabs Flurbiprofen Penicillamine   Butalbital Compound Four-way cold tablets Penicillin   Butazolidin Fragmin Pepto-Bismol   Carbenicillin Geminisyn Percodan   Carna Arthritis Reliever Geopen Persantine   Carprofen Gold's salt Persistin   Chloramphenicol Goody's Phenylbutazone   Chloromycetin Haltrain Piroxlcam   Clmetidine heparin Plaquenil   Cllnoril Hyco-pap Ponstel   Clofibrate Hydroxy chloroquine Propoxyphen         Before stopping any of these medications, be sure to consult the physician who ordered them.  Some, such as Coumadin (Warfarin) are ordered to prevent or treat serious conditions such as "deep thrombosis", "pumonary embolisms", and other heart problems.  The amount of time that you may need off of the medication may also vary with the medication and the reason for which you were taking it.  If you are taking any of these medications, please make sure you notify your pain physician before you undergo any  procedures.         Epidural Steroid Injection Patient Information  Description: The epidural space surrounds the nerves as they exit the spinal cord.  In some patients, the nerves can be compressed and inflamed by a bulging disc or a tight spinal canal (spinal stenosis).  By injecting steroids into the epidural space, we can bring irritated nerves into direct contact with a potentially helpful medication.  These steroids act directly on the irritated nerves and can reduce swelling and inflammation which often leads to decreased pain.  Epidural steroids may be injected anywhere along the spine and from the neck to the low back depending upon the location of your pain.   After numbing the skin with local anesthetic (like Novocaine), a small needle is passed   into the epidural space slowly.  You may experience a sensation of pressure while this is being done.  The entire block usually last less than 10 minutes.  Conditions which may be treated by epidural steroids:   Low back and leg pain  Neck and arm pain  Spinal stenosis  Post-laminectomy syndrome  Herpes zoster (shingles) pain  Pain from compression fractures  Preparation for the injection:  1. Do not eat any solid food or dairy products within 8 hours of your appointment.  2. You may drink clear liquids up to 3 hours before appointment.  Clear liquids include water, black coffee, juice or soda.  No milk or cream please. 3. You may take your regular medication, including pain medications, with a sip of water before your appointment  Diabetics should hold regular insulin (if taken separately) and take 1/2 normal NPH dos the morning of the procedure.  Carry some sugar containing items with you to your appointment. 4. A driver must accompany you and be prepared to drive you home after your procedure.  5. Bring all your current medications with your. 6. An IV may be inserted and sedation may be given at the discretion of the  physician.   7. A blood pressure cuff, EKG and other monitors will often be applied during the procedure.  Some patients may need to have extra oxygen administered for a short period. 8. You will be asked to provide medical information, including your allergies, prior to the procedure.  We must know immediately if you are taking blood thinners (like Coumadin/Warfarin)  Or if you are allergic to IV iodine contrast (dye). We must know if you could possible be pregnant.  Possible side-effects:  Bleeding from needle site  Infection (rare, may require surgery)  Nerve injury (rare)  Numbness & tingling (temporary)  Difficulty urinating (rare, temporary)  Spinal headache ( a headache worse with upright posture)  Light -headedness (temporary)  Pain at injection site (several days)  Decreased blood pressure (temporary)  Weakness in arm/leg (temporary)  Pressure sensation in back/neck (temporary)  Call if you experience:  Fever/chills associated with headache or increased back/neck pain.  Headache worsened by an upright position.  New onset weakness or numbness of an extremity below the injection site  Hives or difficulty breathing (go to the emergency room)  Inflammation or drainage at the infection site  Severe back/neck pain  Any new symptoms which are concerning to you  Please note:  Although the local anesthetic injected can often make your back or neck feel good for several hours after the injection, the pain will likely return.  It takes 3-7 days for steroids to work in the epidural space.  You may not notice any pain relief for at least that one week.  If effective, we will often do a series of three injections spaced 3-6 weeks apart to maximally decrease your pain.  After the initial series, we generally will wait several months before considering a repeat injection of the same type.  If you have any questions, please call [phone] [facility] Clinic[facility] Trigger points are areas where you have pain. A trigger point injection is a shot given in the trigger point to help relieve pain for a few days to a few months. Common places for trigger points include:  The neck.  The shoulders.  The upper back.  The lower back. A trigger point injection will not cure long-term (chronic) pain permanently. These injections  do not always work for every person. For some people, they can help to relieve pain for a few days to a few months. Tell a health care provider about:  Any allergies you have.  All medicines you are taking, including vitamins, herbs, eye drops, creams, and over-the-counter medicines.  Any problems you or family members have had with anesthetic medicines.  Any blood disorders you have.  Any surgeries you have had.  Any medical conditions you have. What are the risks? Generally, this is a safe procedure. However, problems may occur, including:  Infection.  Bleeding or bruising.  Allergic reaction to the injected medicine.  Irritation of the skin around the injection site. What happens before the procedure? Ask your health care provider about:  Changing or stopping your regular medicines. This is especially important if you are taking diabetes medicines or blood thinners.  Taking medicines such as aspirin and ibuprofen. These medicines can thin your blood. Do not take these medicines unless your health care provider tells you to take them.  Taking over-the-counter medicines, vitamins, herbs, and supplements. What happens during the procedure?   Your health care provider will feel for trigger points. A marker may be used to circle the area for the injection.  The skin over the trigger point will be washed with a germ-killing (antiseptic) solution.  A thin needle is used for the injection. You may feel pain or a twitching feeling when the needle enters the trigger point.  A  numbing solution may be injected into the trigger point. Sometimes a medicine to keep down inflammation is also injected.  Your health care provider may move the needle around the area where the trigger point is located until the tightness and twitching goes away.  After the injection, your health care provider may put gentle pressure over the injection site.  The injection site will be covered with a bandage (dressing). The procedure may vary among health care providers and hospitals. What can I expect after treatment? After treatment, you may have:  Soreness and stiffness for 1-2 days.  A dressing. This can be taken off in a few hours or as told by your health care provider. Follow these instructions at home: Injection site care  Remove your dressing as told by your health care provider.  Check your injection site every day for signs of infection. Check for: ? Redness, swelling, or pain. ? Fluid or blood. ? Warmth. ? Pus or a bad smell. Managing pain, stiffness, and swelling  If directed, put ice on the affected area. ? Put ice in a plastic bag. ? Place a towel between your skin and the bag. ? Leave the ice on for 20 minutes, 2-3 times a day. General instructions  If you were asked to stop your regular medicines, ask your health care provider when you may start taking them again.  Return to your normal activities as told by your health care provider. Ask your health care provider what activities are safe for you.  Do not take baths, swim, or use a hot tub until your health care provider approves.  You may be asked to see an occupational or physical therapist for exercises that reduce muscle strain and stretch the area of the trigger point.  Keep all follow-up visits as told by your health care provider. This is important. Contact a health care provider if:  Your pain comes back, and it is worse than before the injection. You may need more injections.  You have chills  or  a fever.  The injection site becomes more painful, red, swollen, or warm to the touch. Summary  A trigger point injection is a shot given in the trigger point to help relieve pain for a few days to a few months.  Common places for trigger point injections are the neck, shoulder, upper back, and lower back.  These injections do not always work for every person, but for some people, the injections can help to relieve pain for a few days to a few months.  Contact a health care provider if symptoms come back or they are worse than before treatment. Also, get help if the injection site becomes more painful, red, swollen, or warm to the touch. This information is not intended to replace advice given to you by your health care provider. Make sure you discuss any questions you have with your health care provider. Document Revised: 08/20/2018 Document Reviewed: 08/20/2018 [employer] Patient Education  2020 [employer].

## 2020-07-04 NOTE — Progress Notes (Signed)
Safety precautions to be maintained throughout the outpatient stay will include: orient to surroundings, keep bed in low position, maintain call bell within reach at all times, provide assistance with transfer out of bed and ambulation.  

## 2020-07-04 NOTE — Progress Notes (Signed)
PROVIDER NOTE: Information contained herein reflects review and annotations entered in association with encounter. Interpretation of such information and data should be left to medically-trained personnel. Information provided to patient can be located elsewhere in the medical record under "Patient Instructions". Document created using STT-dictation technology, any transcriptional errors that may result from process are unintentional.    Patient: Jordan Welch  Service Category: E/M  Provider: Edward Jolly, MD  DOB: 07-18-1961  DOS: 07/04/2020  Specialty: Interventional Pain Management  MRN: [id number]  Setting: Ambulatory outpatient  [facility]: [facility]  Type: Established Patient    Referring Provider: [facility]  Location: Office  Delivery: Face-to-face     HPI  [name], a 59 y.o. year old male, is here today because of his Radicular pain [M54.10]. [name] primary complain today is Back Pain (lower) Last encounter: My last encounter with him was on 05/19/2020. Pertinent problems: 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 Pain Assessment: Severity of   is reported as a 1 /10. Location: Back Lower/sometimes shoots into right buttock. Onset: More than a month ago. Quality: Aching. Timing: Constant. Modifying factor(s):  Marland Kitchen Vitals:  height is 5\' 11"  (1.803 m) and weight is 237 lb (107.5 kg). His temporal temperature is 96.5 F (35.8 C) (abnormal). His blood pressure is 143/86 (abnormal) and his pulse is 85. His respiration is 18 and oxygen saturation is 99%.   Reason for encounter: follow-up evaluation   Jordan Welch presents today to  discuss and review his lumbar MRI results. Patient continues to endorse low back pain with radiation down his right lower extremity to his posterior lateral thigh along with intermittent left hip pain. He has found new shoe orthotics and has also gotten new shoes. 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  ROS  Constitutional: Denies any fever or chills Gastrointestinal: No reported hemesis, hematochezia, vomiting, or acute GI distress Musculoskeletal: + LBP, left hip pain Neurological: No reported episodes of acute onset apraxia, aphasia, dysarthria, agnosia, amnesia, paralysis, loss of coordination, or loss of consciousness  Medication Review  alfuzosin, atorvastatin, diclofenac, hydrOXYzine, metFORMIN, pantoprazole, psyllium, sildenafil, tiZANidine, traZODone, and venlafaxine  History Review  Allergy: [name] has No Known Allergies. Drug: [name]  reports no history of drug use. Alcohol:  reports current alcohol use. Tobacco:  reports that he has never smoked. He has never used smokeless tobacco. Social: [name]  reports that he has never smoked. He has never used smokeless tobacco. He reports current alcohol use. He reports that he does not use drugs. Medical:  has a past medical history of Anxiety, High cholesterol, and Reflux. Surgical: [name]  has a past surgical history that includes Foot surgery (Right). Family: family history is not on file.   Recent Imaging Review  MR LUMBAR SPINE WO CONTRAST CLINICAL DATA:  Osteoarthritis; lumbosacral low back pain, > 6 wks. Lumbar radiculopathy.  EXAM: MRI LUMBAR SPINE WITHOUT CONTRAST  TECHNIQUE: Multiplanar, multisequence MR imaging of the lumbar spine was performed. No intravenous contrast was administered.  COMPARISON:  None.  FINDINGS: Segmentation:  Standard.  Alignment:  Physiologic.  Vertebrae:  No fracture, evidence of discitis, or bone  lesion.  Conus medullaris and cauda equina: Conus extends to the L1 level. Conus and cauda equina appear normal.  Paraspinal and other soft tissues: Negative.  Disc levels:  T12-L1: No spinal canal or neural foraminal stenosis.  L1-2: Mild loss of disc height and shallow disc bulge without significant spinal canal or neural foraminal stenosis.  L2-3: No spinal canal or neural foraminal stenosis.  L3-4: Shallow disc bulge mild facet degenerative changes. No spinal canal or neural foraminal stenosis.  L4-5: Shallow disc bulge and facet degenerative changes. No spinal canal or neural foraminal stenosis.  L5-S1: Tiny left central/subarticular disc protrusion and mild facet degenerative changes. No spinal canal or neural foraminal stenosis.  IMPRESSION: Mild multilevel degenerative changes of the lumbar spine. No significant spinal canal or neural foraminal stenosis at any level.  Electronically Signed   By: Baldemar Lenis M.D.   On: 05/06/2020 11:21 Note: Reviewed       Reviewed in detail Physical Exam  General appearance: Well nourished, well developed, and well hydrated. In no apparent acute distress Mental status: Alert, oriented x 3 (person, place, & time)       Respiratory: No evidence of acute respiratory distress Eyes: PERLA Vitals: BP (!) 143/86   Pulse 85   Temp (!) 96.5 F (35.8 C) (Temporal)   Resp 18   Ht 5\' 11"  (1.803 m)   Wt 237 lb (107.5 kg)   SpO2 99%   BMI 33.05 kg/m  BMI: Estimated body mass index is 33.05 kg/m as calculated from the following:   Height as of this encounter: 5\' 11"  (1.803 m).   Weight as of this encounter: 237 lb (107.5 kg). Ideal: Ideal body weight: 75.3 kg (166 lb 0.1 oz) Adjusted ideal body weight: 88.2 kg (194 lb 6.5 oz)   Lumbar Spine Area Exam  Skin & Axial Inspection: No masses, redness, or swelling Alignment: Symmetrical Functional ROM: Pain restricted ROM affecting primarily the left Stability: No  instability detected Muscle Tone/Strength: Functionally intact. No obvious neuro-muscular anomalies detected. Sensory (Neurological): Musculoskeletal pain pattern Palpation: Complains of area being tender to palpation       Provocative Tests: Hyperextension/rotation test: (+) bilaterally for facet joint pain. Lumbar quadrant test (Kemp's test): (+) on the left for foraminal stenosis Lateral bending test: (+) ipsilateral radicular pain, on the left. Positive for left-sided foraminal stenosis.  Gait & Posture Assessment  Ambulation: Unassisted Gait: Relatively normal for age and body habitus Posture: WNL  Lower Extremity Exam    Side: Right lower extremity  Side: Left lower extremity  Stability: No instability observed          Stability: No instability observed          Skin & Extremity Inspection: Skin color, temperature, and hair growth are WNL. No peripheral edema or cyanosis. No masses, redness, swelling, asymmetry, or associated skin lesions. No contractures.  Skin & Extremity Inspection: Skin color, temperature, and hair growth are WNL. No peripheral edema or cyanosis. No masses, redness, swelling, asymmetry, or associated skin lesions. No contractures.  Functional ROM: Pain restricted ROM for hip joint          Functional ROM:  Pain restricted motion for the left hip joint                 Muscle Tone/Strength: Functionally intact. No obvious neuro-muscular anomalies detected.  Muscle Tone/Strength: Functionally intact. No obvious neuro-muscular anomalies detected.  Sensory (Neurological):  Unimpaired        Sensory (Neurological): Unimpaired        DTR: Patellar: deferred today Achilles: deferred today Plantar: deferred today  DTR: Patellar: deferred today Achilles: deferred today Plantar: deferred today  Palpation: No palpable anomalies  Palpation: No palpable anomalies     Assessment   Status Diagnosis  Persistent Recurring Persistent 1. Radicular pain  (lumbar)   2. Lumbar radicular pain   3. Myofascial pain syndrome of lumbar spine   4. Lumbar degenerative disc disease   5. Chronic pain syndrome      Updated Problems: Problem  Myofascial Pain Syndrome of Lumbar Spine  Lumbar Radicular Pain    Plan of Care   I had extensive discussion with the patient regarding his lumbar MRI. It is unremarkable for neuroforaminal stenosis, spinal canal stenosis, disc herniation. Patient does have very mild lumbar degenerative disc disease in his lower lumbar spine. There is not anything overly concerning on his lumbar MRI. Even though the patient's lumbar MRI does not show any nerve root compression, patient's clinical symptoms suggest radicular pain and for this we discussed a diagnostic lumbar epidural steroid injection. As needed order placed. We also discussed possible trigger point injections for lower lumbar myofascial pain syndrome as his low back pain could have a significant musculoskeletal component to his well. He would like to proceed with these and we will plan on doing these in the next 3 weeks.  Orders:  Orders Placed This Encounter  Procedures  . Lumbar Epidural Injection    Standing Status:   Standing    Number of Occurrences:   9    Standing Expiration Date:   07/04/2021    Scheduling Instructions:     Purpose: Palliative     Indication: Lower extremity pain/Sciatica unspecified side (M54.30).     Side: Midline     Level: TBD     Sedation: Patient's choice.     TIMEFRAME: PRN procedure. (Mr. Gulas will call when needed.)    Order Specific Question:   Where will this procedure be performed?    Answer:   [facility]  . TRIGGER POINT INJECTION    Standing Status:   Future    Standing Expiration Date:   10/02/2020    Scheduling Instructions:     Area: low back     Side: Bilateral     Sedation: Patient's choice.     Timeframe: ASAA    Order Specific Question:   Where will this procedure be performed?    Answer:   [facility]   Follow-up plan:   Return in about 4 weeks (around 08/01/2020) for Lumbar TPI, without sedation.   Recent Visits Date Type Provider Dept  04/20/20 Office Visit Edward Jolly, MD [facility]  Showing recent visits within past 90 days and meeting all other requirements Today's Visits Date Type Provider Dept  07/04/20 Office Visit Edward Jolly, MD [facility]  Showing today's visits and meeting all other requirements Future Appointments Date Type Provider Dept  07/27/20 Appointment Edward Jolly, MD [facility]  Showing future appointments within next 90 days and meeting all other requirements  I discussed the assessment and treatment plan with the patient. The patient was provided an opportunity to ask questions and all were answered. The patient agreed with the plan and demonstrated an understanding of the instructions.  Patient advised to call back or seek an in-person evaluation if the symptoms or condition worsens.  Duration  of encounter: 30 minutes.  Note by: Edward JollyBilal Shamyia Grandpre, MD Date: 07/04/2020; Time: 1:13 PM

## 2020-07-27 ENCOUNTER — Encounter: Payer: Self-pay | Admitting: Student in an Organized Health Care Education/Training Program

## 2020-07-27 ENCOUNTER — Ambulatory Visit
Payer: No Typology Code available for payment source | Attending: Student in an Organized Health Care Education/Training Program | Admitting: Student in an Organized Health Care Education/Training Program

## 2020-07-27 ENCOUNTER — Other Ambulatory Visit: Payer: Self-pay

## 2020-07-27 VITALS — BP 143/78 | HR 77 | Temp 97.2°F | Resp 18 | Ht 71.0 in | Wt 237.0 lb

## 2020-07-27 DIAGNOSIS — M7918 Myalgia, other site: Secondary | ICD-10-CM | POA: Diagnosis present

## 2020-07-27 MED ORDER — ROPIVACAINE HCL 2 MG/ML IJ SOLN
INTRAMUSCULAR | Status: AC
  Filled 2020-07-27: qty 10

## 2020-07-27 MED ORDER — ROPIVACAINE HCL 2 MG/ML IJ SOLN: 9.0000 mL | Freq: Once | INTRAMUSCULAR | Status: DC

## 2020-07-27 MED ORDER — ROPIVACAINE HCL 2 MG/ML IJ SOLN
9.0000 mL | Freq: Once | INTRAMUSCULAR | Status: DC
  Administered 2020-07-27: 9 mL via PERINEURAL

## 2020-07-27 NOTE — Progress Notes (Signed)
Safety precautions to be maintained throughout the outpatient stay will include: orient to surroundings, keep bed in low position, maintain call bell within reach at all times, provide assistance with transfer out of bed and ambulation.  

## 2020-07-27 NOTE — Progress Notes (Signed)
PROVIDER NOTE: Information contained herein reflects review and annotations entered in association with encounter. Interpretation of such information and data should be left to medically-trained personnel. Information provided to patient can be located elsewhere in the medical record under "Patient Instructions". Document created using STT-dictation technology, any transcriptional errors that may result from process are unintentional.    Patient: Jordan Welch  Service Category: Procedure  Provider: [name], MD  DOB: October 21, 1960  DOS: 07/27/2020  Location: [facility]  MRN: [id number]  Setting: Ambulatory - outpatient  Referring Provider: [facility]  Type: Established Patient  Specialty: Interventional Pain Management  PCP: [place]   Primary Reason for Visit: Interventional Pain Management Treatment. CC: Back Pain  Procedure:          Anesthesia, Analgesia, Anxiolysis:  Type: Trigger Point Injection (3+ muscle groups)          CPT: 20553 Primary Purpose: Diagnostic Level: Lumbar Target Area: Lumbar Trigger Point Approach: Percutaneous, ipsilateral approach. Laterality: Left-Sided        Type: Local Anesthesia Indication(s): Analgesia         Local Anesthetic: Lidocaine 1-2% Route: Infiltration ([place]/IM) IV Access: Declined Sedation: Declined   Position: Prone   Indications: 1. Myofascial pain syndrome of lumbar spine    Pain Score: Pre-procedure: 1 /10 Post-procedure: 1 /10   Pre-op H&P Assessment:  Jordan Welch is a 60 y.o. (year old), male patient, seen today for interventional treatment. He  has a past surgical history that includes Foot surgery (Right). Jordan Welch has a current medication list which includes the following prescription(s): alfuzosin, atorvastatin, diclofenac, hydroxyzine, metformin, pantoprazole, psyllium, sildenafil, tizanidine, trazodone, and venlafaxine, and the following Facility-Administered Medications: ropivacaine (pf) 2 mg/ml  (0.2%). His primarily concern today is the Back Pain  Initial Vital Signs:  Pulse/HCG Rate: 76  Temp: (!) 96.4 F (35.8 C) Resp: 16 BP: 137/83 SpO2: 98 %  BMI: Estimated body mass index is 33.05 kg/m as calculated from the following:   Height as of this encounter: 5\' 11"  (1.803 m).   Weight as of this encounter: 237 lb (107.5 kg).  Risk Assessment: Allergies: Reviewed. He has No Known Allergies.  Allergy Precautions: None required Coagulopathies: Reviewed. None identified.  Blood-thinner therapy: None at this time Active Infection(s): Reviewed. None identified. Jordan Welch is afebrile  Site Confirmation: Jordan Welch was asked to confirm the procedure and laterality before marking the site Procedure checklist: Completed Consent: Before the procedure and under the influence of no sedative(s), amnesic(s), or anxiolytics, the patient was informed of the treatment options, risks and possible complications. To fulfill our ethical and legal obligations, as recommended by the American Medical Association's Code of Ethics, I have informed the patient of my clinical impression; the nature and purpose of the treatment or procedure; the risks, benefits, and possible complications of the intervention; the alternatives, including doing nothing; the risk(s) and benefit(s) of the alternative treatment(s) or procedure(s); and the risk(s) and benefit(s) of doing nothing. The patient was provided information about the general risks and possible complications associated with the procedure. These may include, but are not limited to: failure to achieve desired goals, infection, bleeding, organ or nerve damage, allergic reactions, paralysis, and death. In addition, the patient was informed of those risks and complications associated to the procedure, such as failure to decrease pain; infection; bleeding; organ or nerve damage with subsequent damage to sensory, motor, and/or autonomic systems, resulting in permanent  pain, numbness, and/or weakness of one or several  areas of the body; allergic reactions; (i.e.: anaphylactic reaction); and/or death. Furthermore, the patient was informed of those risks and complications associated with the medications. These include, but are not limited to: allergic reactions (i.e.: anaphylactic or anaphylactoid reaction(s)); adrenal axis suppression; blood sugar elevation that in diabetics may result in ketoacidosis or comma; water retention that in patients with history of congestive heart failure may result in shortness of breath, pulmonary edema, and decompensation with resultant heart failure; weight gain; swelling or edema; medication-induced neural toxicity; particulate matter embolism and blood vessel occlusion with resultant organ, and/or nervous system infarction; and/or aseptic necrosis of one or more joints. Finally, the patient was informed that Medicine is not an exact science; therefore, there is also the possibility of unforeseen or unpredictable risks and/or possible complications that may result in a catastrophic outcome. The patient indicated having understood very clearly. We have given the patient no guarantees and we have made no promises. Enough time was given to the patient to ask questions, all of which were answered to the patient's satisfaction. Jordan Welch has indicated that he wanted to continue with the procedure. Attestation: I, the ordering provider, attest that I have discussed with the patient the benefits, risks, side-effects, alternatives, likelihood of achieving goals, and potential problems during recovery for the procedure that I have provided informed consent. Date  Time: 07/27/2020 10:49 AM  Pre-Procedure Preparation:  Monitoring: As per clinic protocol. Respiration, ETCO2, SpO2, BP, heart rate and rhythm monitor placed and checked for adequate function Safety Precautions: Patient was assessed for positional comfort and pressure points before starting  the procedure. Time-out: I initiated and conducted the "Time-out" before starting the procedure, as per protocol. The patient was asked to participate by confirming the accuracy of the "Time Out" information. Verification of the correct person, site, and procedure were performed and confirmed by me, the nursing staff, and the patient. "Time-out" conducted as per Joint Commission's Universal Protocol (UP.01.01.01). Time: 1110  Description of Procedure:          Area Prepped: Entire             Region DuraPrep (Iodine Povacrylex [0.7% available iodine] and Isopropyl Alcohol, 74% w/w) Safety Precautions: Aspiration looking for blood return was conducted prior to all injections. At no point did we inject any substances, as a needle was being advanced. No attempts were made at seeking any paresthesias. Safe injection practices and needle disposal techniques used. Medications properly checked for expiration dates. SDV (single dose vial) medications used. Description of the Procedure: Protocol guidelines were followed. The patient was placed in position over the fluoroscopy table. The target area was identified and the area prepped in the usual manner. Skin & deeper tissues infiltrated with local anesthetic. Appropriate amount of time allowed to pass for local anesthetics to take effect. The procedure needles were then advanced to the target area. Proper needle placement secured. Negative aspiration confirmed. Solution injected in intermittent fashion, asking for systemic symptoms every 0.5cc of injectate. The needles were then removed and the area cleansed, making sure to leave some of the prepping solution back to take advantage of its long term bactericidal properties.  Vitals:   07/27/20 1049 07/27/20 1114  BP: 137/83 (!) 143/78  Pulse: 76 77  Resp: 16 18  Temp: (!) 96.4 F (35.8 C) (!) 97.2 F (36.2 C)  TempSrc: Temporal Temporal  SpO2: 98% 97%  Weight: 237 lb (107.5 kg)   Height: 5\' 11"  (1.803 m)      Start  Time: 1111 hrs. End Time: 1114 hrs. Materials:  Needle(s) Type: Regular needle Gauge: 27G Length: 1.5-in Medication(s): Please see orders for medications and dosing details. Approximately 10 trigger points injected in the left lower lumbar paraspinal region with 0.5 to 1 cc of 0.2% ropivacaine and needling done along myofascial band.   Post-operative Assessment:  Post-procedure Vital Signs:  Pulse/HCG Rate: 77  Temp: (!) 97.2 F (36.2 C) Resp: 18 BP: (!) 143/78 SpO2: 97 %  EBL: None  Complications: No immediate post-treatment complications observed by team, or reported by patient.  Note: The patient tolerated the entire procedure well. A repeat set of vitals were taken after the procedure and the patient was kept under observation following institutional policy, for this type of procedure. Post-procedural neurological assessment was performed, showing return to baseline, prior to discharge. The patient was provided with post-procedure discharge instructions, including a section on how to identify potential problems. Should any problems arise concerning this procedure, the patient was given instructions to immediately contact us, at any time, without hesitation. In any case, we plan to contact the patient by telephone for a follow-up status report regarding this interventional procedure.  Comments:  No additional relevant information.  Plan of Care   Medications ordered for procedure: Meds ordered this encounter  Medications  . ropivacaine (PF) 2 mg/mL (0.2%) (NAROPIN) injection 9 mL  . DISCONTD: ropivacaine (PF) 2 mg/mL (0.2%) (NAROPIN) injection 9 mL   Medications administered: We administered ropivacaine (PF) 2 mg/mL (0.2%).  See the medical record for exact dosing, route, and time of administration.  Follow-up plan:   Return if symptoms worsen or fail to improve.    Recent Visits Date Type Provider Dept  07/04/20 Office Visit Edward Jolly, MD [facility]  Showing recent visits within past 90 days and meeting all other requirements Today's Visits Date Type Provider Dept  07/27/20 Procedure visit Edward Jolly, MD [facility]  Showing today's visits and meeting all other requirements Future Appointments No visits were found meeting these conditions. Showing future appointments within next 90 days and meeting all other requirements  Disposition: Discharge home  Discharge (Date  Time): 07/27/2020; 1119 hrs.   [facility] Phys[facility]: [facility] Note by: Edward Jolly, MD Date: 07/27/2020; Time: 11:20 AM  Disclaimer:  Medicine is not an exact science. The only guarantee in medicine is that nothing is guaranteed. It is important to note that the decision to proceed with this intervention was based on the information collected from the patient. The Data and conclusions were drawn from the patient's questionnaire, the interview, and the physical examination. Because the information was provided in large part by the patient, it cannot be guaranteed that it has not been purposely or unconsciously manipulated. Every effort has been made to obtain as much relevant data as possible for this evaluation. It is important to note that the conclusions that lead to this procedure are derived in large part from the available data. Always take into account that the treatment will also be dependent on availability of resources and existing treatment guidelines, considered by other Pain Management Practitioners as being common knowledge and practice, at the time of the intervention. For Medico-Legal purposes, it is also important to point out that variation in procedural techniques and pharmacological choices are the acceptable norm. The indications, contraindications, technique, and results of the above procedure should only be interpreted and judged by a Board-Certified Interventional Pain  Specialist with extensive  familiarity and expertise in the same exact procedure and technique.

## 2020-07-28 ENCOUNTER — Telehealth: Payer: Self-pay | Admitting: *Deleted

## 2020-07-28 NOTE — Telephone Encounter (Signed)
Called for post procedure check. No answer. LVM for patient to return call for any problems or concerns.

## 2020-12-28 ENCOUNTER — Other Ambulatory Visit: Payer: Self-pay

## 2020-12-28 ENCOUNTER — Ambulatory Visit
Payer: No Typology Code available for payment source | Attending: Student in an Organized Health Care Education/Training Program | Admitting: Student in an Organized Health Care Education/Training Program

## 2020-12-28 ENCOUNTER — Encounter: Payer: Self-pay | Admitting: Student in an Organized Health Care Education/Training Program

## 2020-12-28 VITALS — BP 148/82 | HR 72 | Temp 97.0°F | Resp 16 | Ht 71.0 in | Wt 242.0 lb

## 2020-12-28 DIAGNOSIS — M5416 Radiculopathy, lumbar region: Secondary | ICD-10-CM | POA: Diagnosis not present

## 2020-12-28 DIAGNOSIS — M47812 Spondylosis without myelopathy or radiculopathy, cervical region: Secondary | ICD-10-CM | POA: Diagnosis not present

## 2020-12-28 DIAGNOSIS — G894 Chronic pain syndrome: Secondary | ICD-10-CM | POA: Insufficient documentation

## 2020-12-28 DIAGNOSIS — M5410 Radiculopathy, site unspecified: Secondary | ICD-10-CM | POA: Diagnosis not present

## 2020-12-28 DIAGNOSIS — M5136 Other intervertebral disc degeneration, lumbar region: Secondary | ICD-10-CM | POA: Insufficient documentation

## 2020-12-28 MED ORDER — GABAPENTIN 600 MG PO TABS: 600.0000 mg | ORAL_TABLET | Freq: Three times a day (TID) | ORAL | 2 refills | Status: DC

## 2020-12-28 NOTE — Progress Notes (Signed)
Safety precautions to be maintained throughout the outpatient stay will include: orient to surroundings, keep bed in low position, maintain call bell within reach at all times, provide assistance with transfer out of bed and ambulation.  

## 2020-12-28 NOTE — Progress Notes (Signed)
PROVIDER NOTE: Information contained herein reflects review and annotations entered in association with encounter. Interpretation of such information and data should be left to medically-trained personnel. Information provided to patient can be located elsewhere in the medical record under "Patient Instructions". Document created using STT-dictation technology, any transcriptional errors that may result from process are unintentional.    Patient: Jordan Welch  Service Category: E/M  Provider: [name], MD  DOB: [birth year]  DOS: 12/28/2020  Specialty: Interventional Pain Management  MRN: [id number]  Setting: Ambulatory outpatient  [facility]P: [facility]  Type: Established Patient    Referring Provider: [facility]  Location: Office  Delivery: Face-to-face     HPI  Mr. Arland Usery, a 60 y.o. year old male, is here today because of his Radicular pain [M54.10]. Mr. Stines primary complain today is Leg Pain (Left, lateral, lower) Last encounter: My last encounter with him was on 07/27/2020. Pertinent problems: Mr. 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 Pain Assessment: Severity of Chronic pain is reported as a 0-No pain/10. Location: Leg Left,Lower/ . Onset:  . Quality: Tingling,Burning. Timing: Intermittent. Modifying factor(s): rest. Vitals:  height is '5\' 11"'  (1.803 m) and weight is 242 lb (109.8 kg). His temporal temperature is 97 F (36.1 C) (abnormal). His blood pressure is 148/82 (abnormal) and his pulse is 72. His respiration is 16 and oxygen saturation is 98%.   Reason for encounter: follow-up evaluation     Post-Procedure Evaluation   Procedure (07/27/2020):   Anxiolysis:  Type: Trigger Point Injection (3+ muscle groups)          CPT: 20553 Primary Purpose: Diagnostic Level: Lumbar Target Area: Lumbar Trigger Point Approach: Percutaneous, ipsilateral approach. Laterality: Left-Sided      Sedation: Please see nurses note.  Effectiveness during initial hour after procedure(Ultra-Short Term Relief): 90 %  Local anesthetic used: Long-acting (4-6 hours) Effectiveness: Defined as any analgesic benefit obtained secondary to the administration of local anesthetics. This carries significant diagnostic value as to the etiological location, or anatomical origin, of the pain. Duration of benefit is expected to coincide with the duration of the local anesthetic used.  Effectiveness during initial 4-6 hours after procedure(Short-Term Relief): 90 %   Long-term benefit: Defined as any relief past the pharmacologic duration of the local anesthetics.  Effectiveness past the initial 6 hours after procedure(Long-Term Relief): 75 % (lasted several weeks)  Current benefits: Defined as benefit that persist at this time.   Analgesia:  50% improved  ROS  Constitutional: Denies any fever or chills Gastrointestinal: No reported hemesis, hematochezia, vomiting, or acute GI distress Musculoskeletal: Neuropathic pain of bilateral lower extremity Neurological: No reported episodes of acute onset apraxia, aphasia, dysarthria, agnosia, amnesia, paralysis, loss of coordination, or loss of consciousness  Medication Review  COVID-19 mRNA vaccine (Pfizer), Melatonin, alfuzosin, atorvastatin, gabapentin, loratadine, metFORMIN, pantoprazole, psyllium, traZODone, and venlafaxine  History Review  Allergy: Mr. Hackler has No Known Allergies. Drug: Mr. Lanigan  reports no history of drug use. Alcohol:  reports current alcohol use. Tobacco:  reports that he has never smoked. He has never used smokeless tobacco. Social: Mr. Mozley  reports that he has never  smoked. He has never used smokeless tobacco. He reports current alcohol  use. He reports that he does not use drugs. Medical:  has a past medical history of Anxiety, High cholesterol, and Reflux. Surgical: Mr. Stroder  has a past surgical history that includes Foot surgery (Right). Family: family history is not on file.  Laboratory Chemistry Profile   Renal No results found for: BUN, CREATININE, LABCREA, BCR, GFR, GFRAA, GFRNONAA, LABVMA, EPIRU, EPINEPH24HUR, NOREPRU, NOREPI24HUR, DOPARU, [id number]   Hepatic No results found for: AST, ALT, ALBUMIN, ALKPHOS, HCVAB, AMYLASE, LIPASE, AMMONIA   Electrolytes No results found for: NA, K, CL, CALCIUM, MG, PHOS   Bone No results found for: VD25OH, [id number], GK8159EL0, RA1518DU3, 25OHVITD1, 25OHVITD2, 25OHVITD3, TESTOFREE, TESTOSTERONE   Inflammation (CRP: Acute Phase) (ESR: Chronic Phase) No results found for: CRP, ESRSEDRATE, LATICACIDVEN     Note: Above Lab results reviewed.  Recent Imaging Review  MR LUMBAR SPINE WO CONTRAST CLINICAL DATA:  Osteoarthritis; lumbosacral low back pain, > 6 wks. Lumbar radiculopathy.  EXAM: MRI LUMBAR SPINE WITHOUT CONTRAST  TECHNIQUE: Multiplanar, multisequence MR imaging of the lumbar spine was performed. No intravenous contrast was administered.  COMPARISON:  None.  FINDINGS: Segmentation:  Standard.  Alignment:  Physiologic.  Vertebrae:  No fracture, evidence of discitis, or bone lesion.  Conus medullaris and cauda equina: Conus extends to the L1 level. Conus and cauda equina appear normal.  Paraspinal and other soft tissues: Negative.  Disc levels:  T12-L1: No spinal canal or neural foraminal stenosis.  L1-2: Mild loss of disc height and shallow disc bulge without significant spinal canal or neural foraminal stenosis.  L2-3: No spinal canal or neural foraminal stenosis.  L3-4: Shallow disc bulge mild facet degenerative changes. No spinal canal or neural foraminal  stenosis.  L4-5: Shallow disc bulge and facet degenerative changes. No spinal canal or neural foraminal stenosis.  L5-S1: Tiny left central/subarticular disc protrusion and mild facet degenerative changes. No spinal canal or neural foraminal stenosis.  IMPRESSION: Mild multilevel degenerative changes of the lumbar spine. No significant spinal canal or neural foraminal stenosis at any level.  Electronically Signed   By: Pedro Earls M.D.   On: 05/06/2020 11:21 Note: Reviewed        Physical Exam  General appearance: Well nourished, well developed, and well hydrated. In no apparent acute distress Mental status: Alert, oriented x 3 (person, place, & time)       Respiratory: No evidence of acute respiratory distress Eyes: PERLA Vitals: BP (!) 148/82   Pulse 72   Temp (!) 97 F (36.1 C) (Temporal)   Resp 16   Ht '5\' 11"'  (1.803 m)   Wt 242 lb (109.8 kg)   SpO2 98%   BMI 33.75 kg/m  BMI: Estimated body mass index is 33.75 kg/m as calculated from the following:   Height as of this encounter: '5\' 11"'  (1.803 m).   Weight as of this encounter: 242 lb (109.8 kg). Ideal: Ideal body weight: 75.3 kg (166 lb 0.1 oz) Adjusted ideal body weight: 89.1 kg (196 lb 6.5 oz)  Assessment   Status Diagnosis  Controlled Controlled Controlled 1. Radicular pain (lumbar)   2. Lumbar radicular pain   3. Lumbar degenerative disc disease   4. Cervical facet joint syndrome   5. Chronic pain syndrome      Plan of Care   Mr. Khyrie Masi has a current medication list which includes the following long-term medication(s): atorvastatin, gabapentin, loratadine, metformin, pantoprazole, trazodone, and venlafaxine.  Overall patient responded positively to his left trigger point injections in the lumbar  region.  He states that his low back pain is better as result.  He is having patches of numbness and tingling associated with exertion and activity in bilateral legs.  We discussed work-up and  potential management.  If symptoms do not improve with gabapentin, recommend EMG/nerve conduction velocity study of lower extremity.  Otherwise recommend patient start gabapentin as below.  Follow-up in 7 weeks for medication management.  Pharmacotherapy (Medications Ordered): Meds ordered this encounter  Medications  . gabapentin (NEURONTIN) 600 MG tablet    Sig: Take 1 tablet (600 mg total) by mouth 3 (three) times daily.    Dispense:  90 tablet    Refill:  2   Follow-up plan:   Return in about 7 weeks (around 02/15/2021) for Medication Management, virtual.   Recent Visits No visits were found meeting these conditions. Showing recent visits within past 90 days and meeting all other requirements Today's Visits Date Type Provider Dept  12/28/20 Office Visit [name], MD [facility]  Showing today's visits and meeting all other requirements Future Appointments Date Type Provider Dept  02/14/21 Appointment [name], MD [facility]  Showing future appointments within next 90 days and meeting all other requirements  I discussed the assessment and treatment plan with the patient. The patient was provided an opportunity to ask questions and all were answered. The patient agreed with the plan and demonstrated an understanding of the instructions.  Patient advised to call back or seek an in-person evaluation if the symptoms or condition worsens.  Duration of encounter: 31mnutes.  Note by: BGillis Santa MD Date: 12/28/2020; Time: 2:43 PM

## 2021-02-14 ENCOUNTER — Encounter: Payer: Self-pay | Admitting: Student in an Organized Health Care Education/Training Program

## 2021-02-14 ENCOUNTER — Telehealth: Payer: Self-pay

## 2021-02-14 ENCOUNTER — Ambulatory Visit
Payer: No Typology Code available for payment source | Attending: Student in an Organized Health Care Education/Training Program | Admitting: Student in an Organized Health Care Education/Training Program

## 2021-02-14 ENCOUNTER — Other Ambulatory Visit: Payer: Self-pay

## 2021-02-14 DIAGNOSIS — G894 Chronic pain syndrome: Secondary | ICD-10-CM | POA: Diagnosis not present

## 2021-02-14 DIAGNOSIS — M47812 Spondylosis without myelopathy or radiculopathy, cervical region: Secondary | ICD-10-CM | POA: Diagnosis not present

## 2021-02-14 DIAGNOSIS — M792 Neuralgia and neuritis, unspecified: Secondary | ICD-10-CM

## 2021-02-14 MED ORDER — GABAPENTIN 600 MG PO TABS: 600.0000 mg | ORAL_TABLET | Freq: Three times a day (TID) | ORAL | 2 refills | Status: DC

## 2021-02-14 NOTE — Telephone Encounter (Signed)
LM to call office for pre virtual appointment questions.,

## 2021-02-14 NOTE — Progress Notes (Signed)
Patient: Jordan Welch  Service Category: E/M  Provider: Edward Jolly, MD  DOB: 18-Oct-1960  DOS: 02/14/2021  Location: Office  MRN: [id number]  Setting: Ambulatory outpatient  Referring Provider: [facility]  Type: Established Patient  Specialty: Interventional Pain Management  PCP: [facility]  Location: Home  Delivery: TeleHealth     Virtual Encounter - Pain Management PROVIDER NOTE: Information contained herein reflects review and annotations entered in association with encounter. Interpretation of such information and data should be left to medically-trained personnel. Information provided to patient can be located elsewhere in the medical record under "Patient Instructions". Document created using STT-dictation technology, any transcriptional errors that may result from process are unintentional.    Contact & Pharmacy Preferred: [phone] Home: [phone] (home) Mobile: [phone] (mobile) E-mail: [email or web address]  [facility] - [place] - [address] [place] [address] Phone: [phone] Fax: [phone]  [facility] - [place], [place] - [address] [place] [address] Phone: [phone] Fax: [phone]   Pre-screening  Mr. Ginger offered "in-person" vs "virtual" encounter. He indicated preferring virtual for this encounter.   Reason COVID-19*  Social distancing based on CDC and AMA recommendations.   I contacted Patrick Salemi on 02/14/2021 video conference.      I clearly identified myself as Edward Jolly, MD. I verified that I was speaking with the correct person using two identifiers (Name: Dagoberto Nealy, and date of birth: 05/24/1961).  Consent I sought verbal advanced consent from [name] for virtual visit interactions. I informed Mr. Oquinn of possible security and privacy concerns, risks, and limitations associated with providing "not-in-person" medical evaluation and  management services. I also informed Mr. Ned of the availability of "in-person" appointments. Finally, I informed him that there would be a charge for the virtual visit and that he could be  personally, fully or partially, financially responsible for it. Mr. Hansen expressed understanding and agreed to proceed.   Historic Elements   Mr. Haeden Hudock is a 60 y.o. year old, male patient evaluated today after our last contact on 12/28/2020. Mr. Marchena  has a past medical history of Anxiety, High cholesterol, and Reflux. He also  has a past surgical history that includes Foot surgery (Right). Mr. Hollister has a current medication list which includes the following prescription(s): alfuzosin, atorvastatin, covid-19 mrna vaccine (pfizer), gabapentin, loratadine, melatonin, metformin, pantoprazole, psyllium, trazodone, and venlafaxine. He  reports that he has never smoked. He has never used smokeless tobacco. He reports current alcohol use. He reports that he does not use drugs. Mr. Wing has No Known Allergies.   HPI  Today, he is being contacted for medication management.  Is requesting a refill of his gabapentin. Continues to have patches in bilateral lower extremities that are known at times. He also has deep hip pain that he feels is not orthopedic in nature. His SI joint x-rays and bilateral hip x-rays were largely unremarkable, we also performed a MRI of his lumbar spine which shows mild multilevel degenerative changes but no significant spinal canal or neuroforaminal stenosis. Given that the patient continues to have persistent pain that I do not think is arising from a spinal source, I recommend that he see neurology for a potential nerve conduction velocity/EMG study. Patient in agreement with plan.  Imaging  MR LUMBAR SPINE WO CONTRAST CLINICAL DATA:  Osteoarthritis; lumbosacral low back pain, > 6 wks. Lumbar radiculopathy.  EXAM: MRI LUMBAR SPINE WITHOUT CONTRAST  TECHNIQUE: Multiplanar, multisequence  MR imaging of the lumbar spine was performed. No intravenous contrast was administered.  COMPARISON:  None.  FINDINGS: Segmentation:  Standard.  Alignment:  Physiologic.  Vertebrae:  No fracture, evidence of discitis, or bone lesion.  Conus medullaris and cauda equina: Conus extends to the L1 level. Conus and cauda equina appear normal.  Paraspinal and other soft tissues: Negative.  Disc levels:  T12-L1: No spinal canal or neural foraminal stenosis.  L1-2: Mild loss of disc height and shallow disc bulge without significant spinal canal or neural foraminal stenosis.  L2-3: No spinal canal or neural foraminal stenosis.  L3-4: Shallow disc bulge mild facet degenerative changes. No spinal canal or neural foraminal stenosis.  L4-5: Shallow disc bulge and facet degenerative changes. No spinal canal or neural foraminal stenosis.  L5-S1: Tiny left central/subarticular disc protrusion and mild facet degenerative changes. No spinal canal or neural foraminal stenosis.  IMPRESSION: Mild multilevel degenerative changes of the lumbar spine. No significant spinal canal or neural foraminal stenosis at any level.  Electronically Signed   By: Baldemar Lenis M.D.   On: 05/06/2020 11:21  Assessment  The primary encounter diagnosis was Neuropathic pain. Diagnoses of Cervical facet joint syndrome and Chronic pain syndrome were also pertinent to this visit.  Plan of Care    Mr. Herley Bernardini has a current medication list which includes the following long-term medication(s): atorvastatin, gabapentin, loratadine, metformin, pantoprazole, trazodone, and venlafaxine.  Pharmacotherapy (Medications Ordered): Meds ordered this encounter  Medications   gabapentin (NEURONTIN) 600 MG tablet    Sig: Take 1 tablet (600 mg total) by mouth 3 (three) times daily.    Dispense:  90 tablet    Refill:  2    Orders:  Orders Placed This Encounter  Procedures   Ambulatory referral to  Neurology    Referral Priority:   Routine    Referral Type:   Consultation    Referral Reason:   Specialty Services Required    Referred to Provider:   Lonell Face, MD    Requested Specialty:   Neurology    Number of Visits Requested:   1    Follow-up plan:   Return for Patient will call after he has completed his nerve conduction velocity/EMG study.    Recent Visits Date Type Provider Dept  12/28/20 Office Visit Edward Jolly, MD [facility]  Showing recent visits within past 90 days and meeting all other requirements Today's Visits Date Type Provider Dept  02/14/21 Telemedicine Edward Jolly, MD [facility]  Showing today's visits and meeting all other requirements Future Appointments No visits were found meeting these conditions. Showing future appointments within next 90 days and meeting all other requirements I discussed the assessment and treatment plan with the patient. The patient was provided an opportunity to ask questions and all were answered. The patient agreed with the plan and demonstrated an understanding of the instructions.  Patient advised to call back or seek an in-person evaluation if the symptoms or condition worsens.  Duration of encounter: .  Note by: Edward Jolly, MD Date: 02/14/2021; Time: 4:05 PM

## 2021-06-12 ENCOUNTER — Other Ambulatory Visit: Payer: Self-pay | Admitting: Orthopedic Surgery

## 2021-06-12 DIAGNOSIS — M79641 Pain in right hand: Secondary | ICD-10-CM

## 2021-06-20 ENCOUNTER — Ambulatory Visit
Admission: RE | Admit: 2021-06-20 | Discharge: 2021-06-20 | Disposition: A | Discharge status: Home / Self Care | Payer: No Typology Code available for payment source | Source: Ambulatory Visit | Attending: Orthopedic Surgery | Admitting: Orthopedic Surgery

## 2021-06-20 ENCOUNTER — Other Ambulatory Visit: Payer: Self-pay

## 2021-06-20 DIAGNOSIS — M79641 Pain in right hand: Secondary | ICD-10-CM | POA: Diagnosis not present

## 2021-06-20 IMAGING — MR MR [PERSON_NAME]*[PERSON_NAME]* W/O CM
5 series · 39 of 40 positions shown · non-contrast
Comparison: None.

CLINICAL DATA: Fourth digit hyperextension, third digit trigger
finger., right hand pain

EXAM:
MRI OF THE RIGHT HAND WITHOUT CONTRAST
TECHNIQUE: Multiplanar, multisequence MR imaging of the right hand was
performed. No intravenous contrast was administered.

[Series 4: T2 fat-sat · axial · 4.0mm · 0.66mm/px · z∈[-155,+29]mm · 9 of 47 slices shown]
[im 1/47]
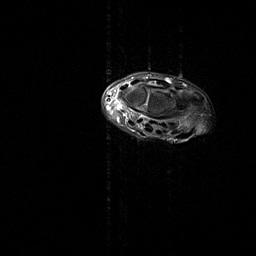
[im 6/47]
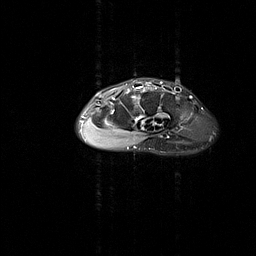
[im 11/47]
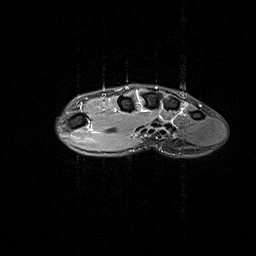
[im 16/47]
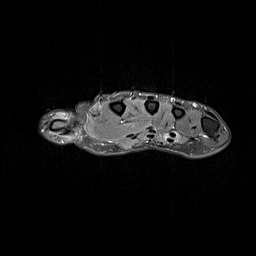
[im 21/47]
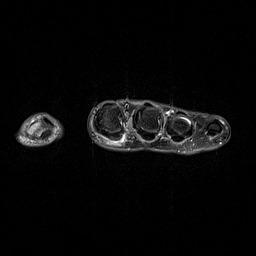
[im 26/47]
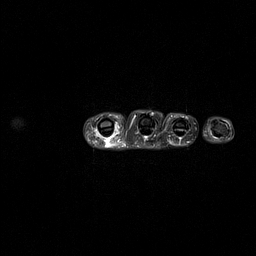
[im 31/47]
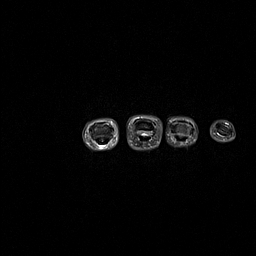
[im 41/47]
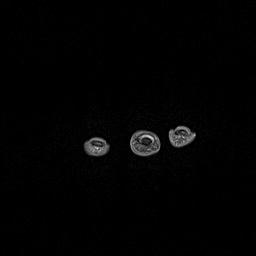
[im 47/47]
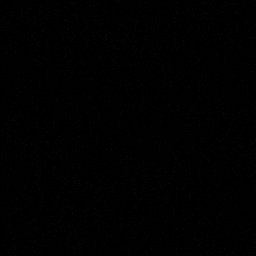

[Series 5: T1 · axial · 4.0mm · 0.66mm/px · z∈[-155,+29]mm · 11 of 47 slices shown (1 of 2)]
[im 1/47]
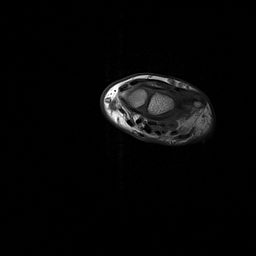
[im 5/47]
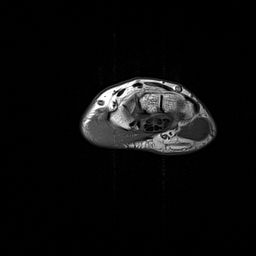
[im 10/47]
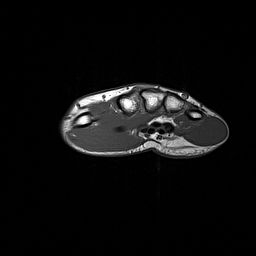
[im 14/47]
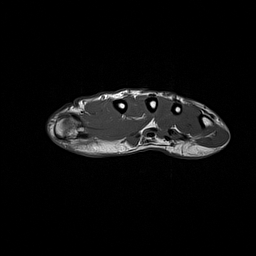
[im 19/47]
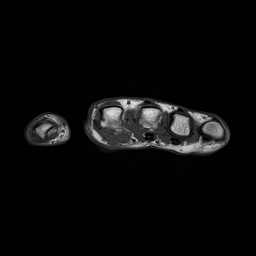
[im 24/47]
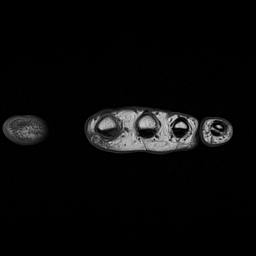
[im 28/47]
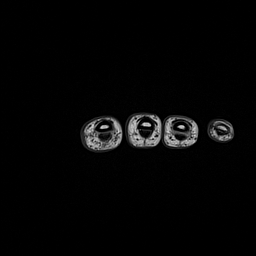
[im 33/47]
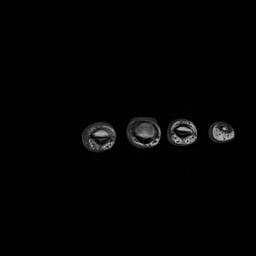
[im 37/47]
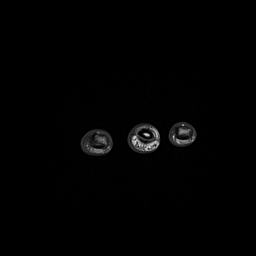
[im 42/47]
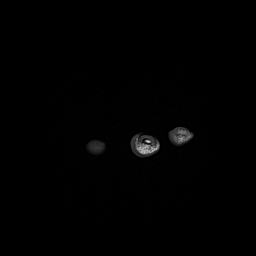
[im 47/47]
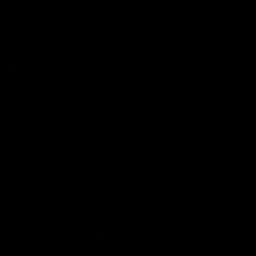

[Series 6: T1 · coronal · 3.0mm · 0.70mm/px · 4 of 19 slices shown (2 of 2)]
[im 1/19]
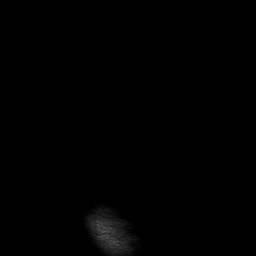
[im 7/19]
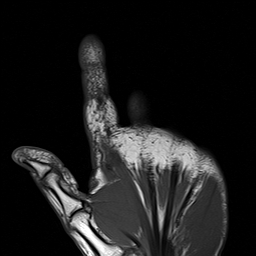
[im 13/19]
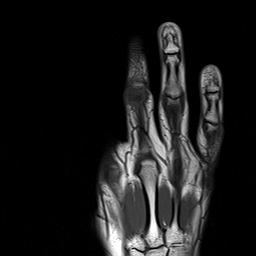
[im 19/19]
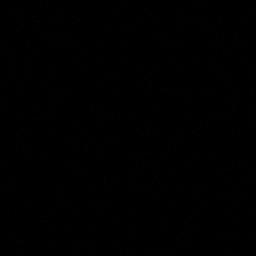

[Series 7: STIR · coronal · 3.0mm · 0.70mm/px · 4 of 19 slices shown]
[im 1/19]
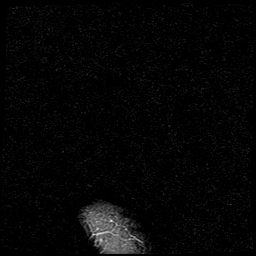
[im 7/19]
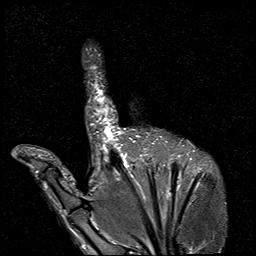
[im 13/19]
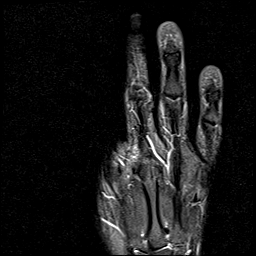
[im 19/19]
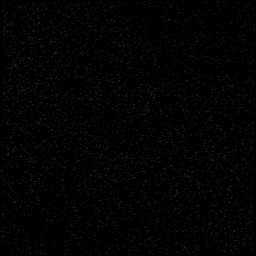

[Series 8: PD fat-sat · sagittal · 3.0mm · 0.31mm/px · 11 of 47 slices shown]
[im 1/47]
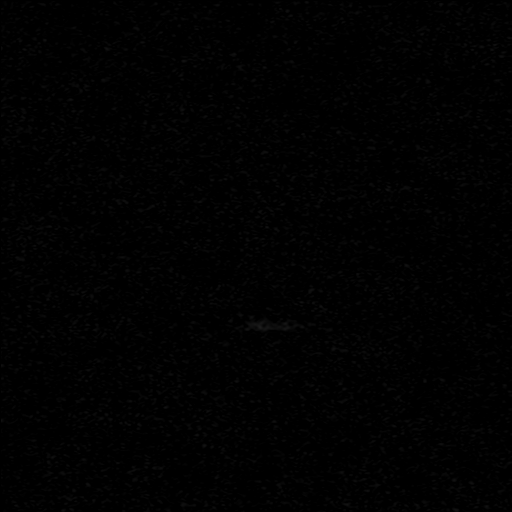
[im 5/47]
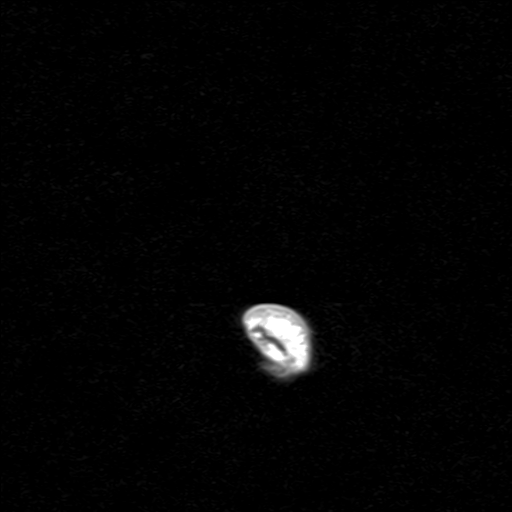
[im 10/47]
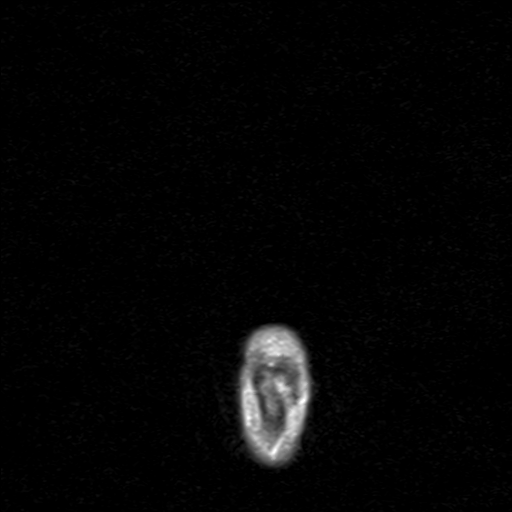
[im 14/47]
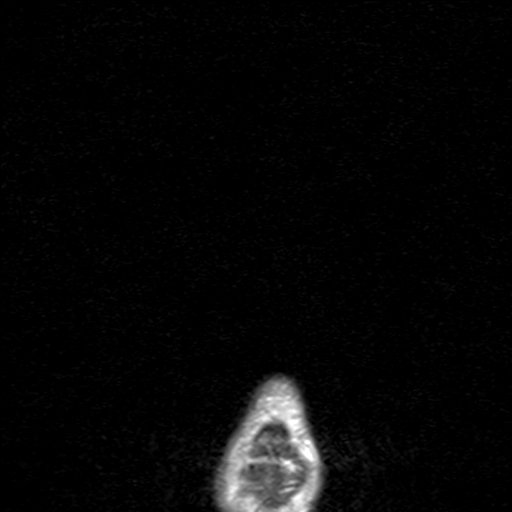
[im 19/47]
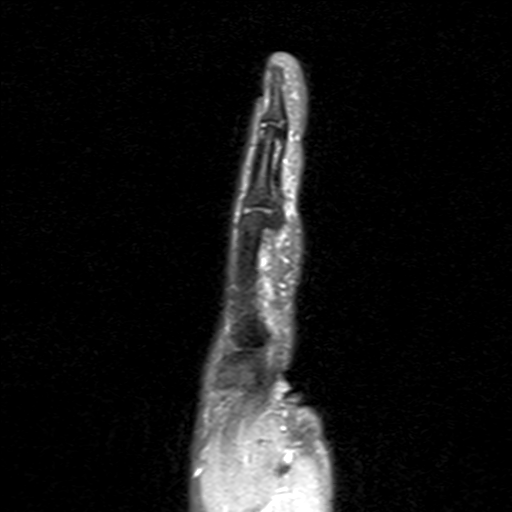
[im 24/47]
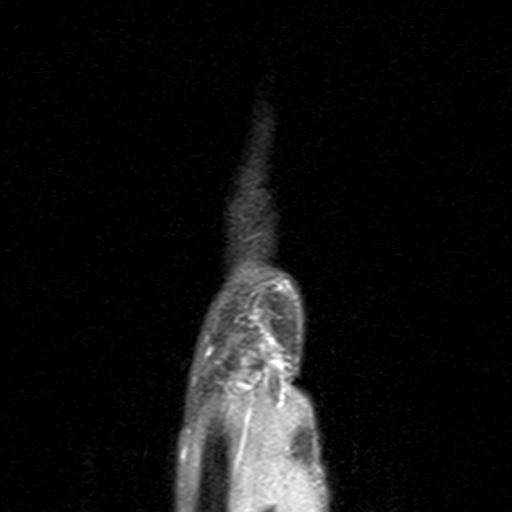
[im 28/47]
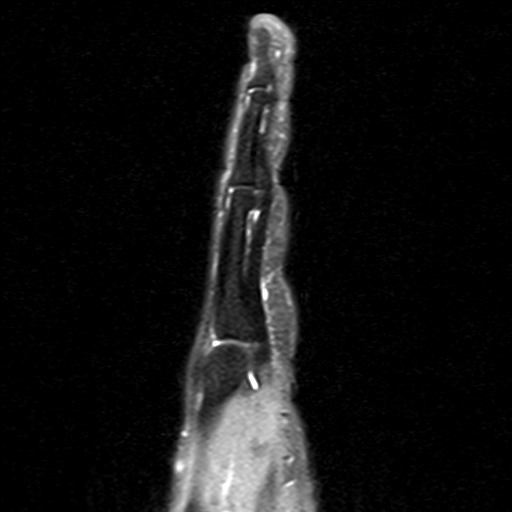
[im 33/47]
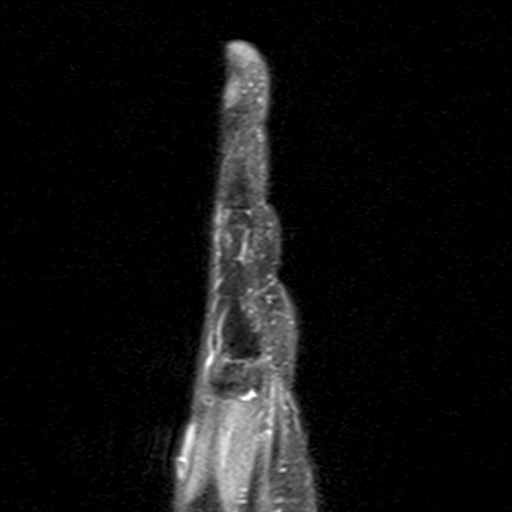
[im 37/47]
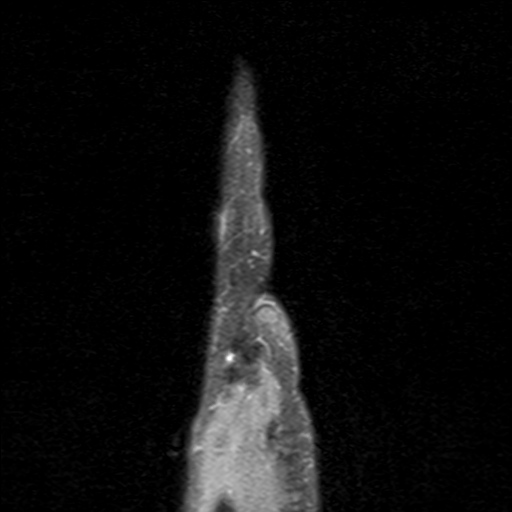
[im 42/47]
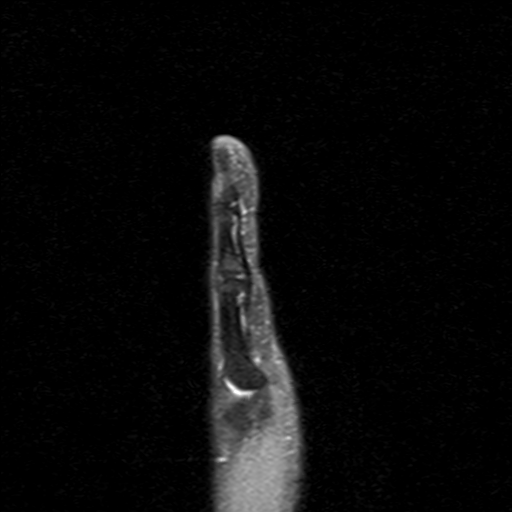
[im 47/47]
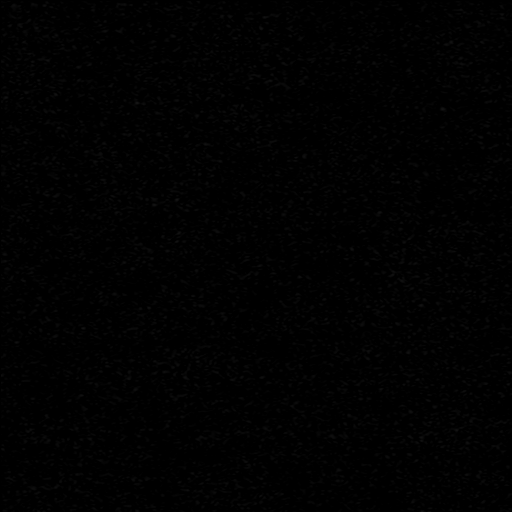

[39 of 40 positions shown; findings below may reference images not displayed]

FINDINGS: Bones/Joint/Cartilage

The cortex is intact. There is no significant marrow signal
alteration.

Ligaments

Collateral ligaments are intact. No evidence of sagittal band tear.
No evidence of pulley injury.

Muscles and Tendons

There is flexor tenosynovitis of the index finger. There is mild
focal intramuscular edema at the origins of the third and fourth
lumbricals (axial T2 image 32). No acute tendon tear.

Soft tissues

Mild soft tissue swelling along the base of the index finger.
IMPRESSION: Mild flexor tenosynovitis of the index finger. Mild soft tissue
swelling at the base of the index finger.

Mild focal intramuscular edema at the origins of the third and
fourth lumbricals, possibly mild strain from recent injury.

No acute tendon tear. No acute osseous abnormality or ligamentous
injury.

## 2022-01-05 ENCOUNTER — Other Ambulatory Visit: Payer: Self-pay | Admitting: Neurology

## 2022-01-05 DIAGNOSIS — G8929 Other chronic pain: Secondary | ICD-10-CM

## 2022-01-19 ENCOUNTER — Ambulatory Visit
Admission: RE | Admit: 2022-01-19 | Discharge: 2022-01-19 | Disposition: A | Discharge status: Home / Self Care | Payer: No Typology Code available for payment source | Source: Ambulatory Visit | Attending: Neurology | Admitting: Neurology

## 2022-01-19 DIAGNOSIS — G8929 Other chronic pain: Secondary | ICD-10-CM | POA: Diagnosis present

## 2022-01-19 DIAGNOSIS — M5442 Lumbago with sciatica, left side: Secondary | ICD-10-CM | POA: Insufficient documentation

## 2022-05-15 ENCOUNTER — Ambulatory Visit
Payer: No Typology Code available for payment source | Attending: Student in an Organized Health Care Education/Training Program | Admitting: Student in an Organized Health Care Education/Training Program

## 2022-05-15 ENCOUNTER — Encounter: Payer: Self-pay | Admitting: Student in an Organized Health Care Education/Training Program

## 2022-05-15 VITALS — BP 159/75 | HR 73 | Temp 98.6°F | Resp 18 | Ht 71.0 in | Wt 240.0 lb

## 2022-05-15 DIAGNOSIS — M542 Cervicalgia: Secondary | ICD-10-CM | POA: Diagnosis present

## 2022-05-15 DIAGNOSIS — M75102 Unspecified rotator cuff tear or rupture of left shoulder, not specified as traumatic: Secondary | ICD-10-CM | POA: Diagnosis not present

## 2022-05-15 DIAGNOSIS — M12812 Other specific arthropathies, not elsewhere classified, left shoulder: Secondary | ICD-10-CM | POA: Diagnosis present

## 2022-05-15 DIAGNOSIS — G894 Chronic pain syndrome: Secondary | ICD-10-CM | POA: Diagnosis present

## 2022-05-15 DIAGNOSIS — M792 Neuralgia and neuritis, unspecified: Secondary | ICD-10-CM | POA: Diagnosis present

## 2022-05-15 DIAGNOSIS — M19012 Primary osteoarthritis, left shoulder: Secondary | ICD-10-CM | POA: Diagnosis not present

## 2022-05-15 DIAGNOSIS — M5416 Radiculopathy, lumbar region: Secondary | ICD-10-CM | POA: Diagnosis present

## 2022-05-15 DIAGNOSIS — M5410 Radiculopathy, site unspecified: Secondary | ICD-10-CM | POA: Diagnosis not present

## 2022-05-15 NOTE — Patient Instructions (Signed)
Preparing for ProcedureInstructions: Oral Intake: Do not eat or drink anything for at least 8 hours prior to your procedure. Transportation: Public transportation is not allowed. Bring an adult driver. The driver must be physically present in our waiting room before any procedure can be started. Physical Assistance: Bring an adult capable of physically assisting you, in the event you need help. Blood Pressure Medicine: Take your blood pressure medicine with a sip of water the morning of the procedure. Insulin: Take only  of your normal insulin dose. Preventing infections: Shower with an antibacterial soap the morning of your procedure. Build-up your immune system: Take 1000 mg of Vitamin C with every meal (3 times a day) the day prior to your procedure. Pregnancy: If you are pregnant, call and cancel the procedure. Sickness: If you have a cold, fever, or any active infections, call and cancel the procedure. Arrival: You must be in the facility at least 30 minutes prior to your scheduled procedure. Children: Do not bring children with you. Dress appropriately: Bring dark clothing that you would not mind if they get stained. Valuables: Do not bring any jewelry or valuables. Procedure appointments are reserved for interventional treatments only. No Prescription Refills. No medication changes will be discussed during procedure appointments. No disability issues will be discussed. Preparing for Procedure with Sedation Instructions: Oral Intake: Do not eat or drink anything for at least 8 hours prior to your procedure. Transportation: Public transportation is not allowed. Bring an adult driver. The driver must be physically present in our waiting room before any procedure can be started. Physical Assistance: Bring an adult capable of physically assisting you, in the event you need help. Blood Pressure Medicine: Take your blood pressure medicine with a sip of water the morning of the  procedure. Insulin: Take only  of your normal insulin dose. Preventing infections: Shower with an antibacterial soap the morning of your procedure. Build-up your immune system: Take 1000 mg of Vitamin C with every meal (3 times a day) the day prior to your procedure. Pregnancy: If you are pregnant, call and cancel the procedure. Sickness: If you have a cold, fever, or any active infections, call and cancel the procedure. Arrival: You must be in the facility at least 30 minutes prior to your scheduled procedure. Children: Do not bring children with you. Dress appropriately: Bring dark clothing that you would not mind if they get stained. Valuables: Do not bring any jewelry or valuables. Procedure appointments are reserved for interventional treatments only. No Prescription Refills. No medication changes will be discussed during procedure appointments. No disability issues will be discussed.

## 2022-05-15 NOTE — Progress Notes (Signed)
PROVIDER NOTE: Information contained herein reflects review and annotations entered in association with encounter. Interpretation of such information and data should be left to medically-trained personnel. Information provided to patient can be located elsewhere in the medical record under "Patient Instructions". Document created using STT-dictation technology, any transcriptional errors that may result from process are unintentional.    Patient: Jordan Welch  Service Category: E/M  Provider: [name], MD  DOB: 09-02-1960  DOS: 05/15/2022  Referring Provider: [facility]  MRN: [id number]  Specialty: Interventional Pain Management  PCP: [facility]  Type: Established Patient  Setting: Ambulatory outpatient    Location: Office  Delivery: Face-to-face     HPI  [name], a 61 y.o. year old male, is here today because of his Neuropathic pain [M79.2]. [name] primary complain today is Shoulder Pain (left), Leg Pain, and Foot Pain Last encounter: My last encounter with him was on 02/14/2021 Pertinent problems: [name] has Radicular pain; Cervical facet joint syndrome; Cervical spondylosis; Lumbar facet arthropathy; Lumbar degenerative disc disease; Chronic pain syndrome; S/P excision of neuroma; Foraminal stenosis of cervical region (RIGHT C5-6 & C6-7); Sacroiliac joint pain; Bilateral hip pain; Dysfunction of left rotator cuff; and Myofascial pain syndrome of lumbar spine on their pertinent problem list. Pain Assessment: Severity of Chronic pain is reported as a 6 /10. Location: Shoulder Left/radiates down towards elbow. Onset: More than a month ago. Quality: Sharp. Timing: Constant. Modifying factor(s): injection. Vitals:  height is $RemoveB'5\' 11"'hbmBakpb$  (1.803 m) and weight is 240 lb (108.9 kg). His temperature is 98.6 F (37 C). His blood pressure is 159/75 (abnormal) and his pulse is 73. His respiration is 18 and oxygen saturation is 100%.   Reason for encounter:   [name] presents today with  multiple pain complaints.  His primary pain is in his left shoulder.  It is worse with shoulder abduction.  He states that he is status post left posterior shoulder steroid injection that was done blindly.  He states that it has helped out with his left posterior shoulder pain but still has pain with shoulder AB duction and anterior shoulder pain.  He has left rotator cuff arthropathy.  We discussed a left suprascapular nerve block and possible RFA.  Patient is also having low back pain with radiates into his left groin.  He does have a disc herniation at L1-L2.  We discussed a L1-L2 left-sided epidural steroid injection.  Risk and benefits reviewed and patient would like to proceed.  We also discussed cervical trigger point injections for cervicalgia.  He has had these before and did fine improvement in his cervical range of motion after these.  He has an upcoming EMG/nerve conduction velocity study scheduled for January with neurology.  ROS  Constitutional: Denies any fever or chills Gastrointestinal: No reported hemesis, hematochezia, vomiting, or acute GI distress Musculoskeletal:  Cervicalgia, left shoulder pain, low back pain with radiation into left greater than right groin Neurological: No reported episodes of acute onset apraxia, aphasia, dysarthria, agnosia, amnesia, paralysis, loss of coordination, or loss of consciousness  Medication Review  Alpha-Lipoic Acid, atorvastatin, gabapentin, loratadine, metFORMIN, pantoprazole, psyllium, traZODone, and venlafaxine  History Review  Allergy: Mr. Mallozzi has No Known Allergies. Drug: Mr. Corlew  reports no history of drug use. Alcohol:  reports current alcohol use. Tobacco:  reports that he has never smoked. He has never used smokeless tobacco. Social: Mr. Gwinner  reports that he has never smoked. He has never used smokeless tobacco. He reports current  alcohol use. He reports that he does not use drugs. Medical:  has a past medical history of  Anxiety, High cholesterol, and Reflux. Surgical: Mr. Blansett  has a past surgical history that includes Foot surgery (Right). Family: family history is not on file.  Laboratory Chemistry Profile   Renal No results found for: "BUN", "CREATININE", "LABCREA", "BCR", "GFR", "GFRAA", "GFRNONAA", "LABVMA", "EPIRU", "EPINEPH24HUR", "NOREPRU", "NOREPI24HUR", "DOPARU", "DOPAM24HRUR"  Hepatic No results found for: "AST", "ALT", "ALBUMIN", "ALKPHOS", "HCVAB", "AMYLASE", "LIPASE", "AMMONIA"  Electrolytes No results found for: "NA", "K", "CL", "CALCIUM", "MG", "PHOS"  Bone No results found for: "VD25OH", "VD125OH2TOT", "[id number]", "YB6389HT3", "25OHVITD1", "25OHVITD2", "25OHVITD3", "TESTOFREE", "TESTOSTERONE"  Inflammation (CRP: Acute Phase) (ESR: Chronic Phase) No results found for: "CRP", "ESRSEDRATE", "LATICACIDVEN"       Note: Above Lab results reviewed.  Recent Imaging Review  MR LUMBAR SPINE WO CONTRAST CLINICAL DATA:  Chronic bilateral low back pain with left-sided sciatica. Bilateral lower extremity pain and numbness.  EXAM: MRI LUMBAR SPINE WITHOUT CONTRAST  TECHNIQUE: Multiplanar, multisequence MR imaging of the lumbar spine was performed. No intravenous contrast was administered.  COMPARISON:  Lumbar spine MRI 05/06/2020  FINDINGS: Segmentation:  Standard.  Alignment:  Normal.  Vertebrae: No fracture, suspicious marrow lesion, or significant marrow edema.  Conus medullaris and cauda equina: Conus extends to the L1 level. Conus and cauda equina appear normal.  Paraspinal and other soft tissues: 9 mm right renal cyst.  Disc levels:  Disc desiccation throughout the lumbar spine with mild disc space narrowing at L1-2 and L5-S1.  T12-L1: Negative.  L1-2: Mild disc bulging and a right extraforaminal disc osteophyte complex which could affect the extraforaminal right L1 nerve, unchanged. No spinal stenosis or neural foraminal stenosis.  L2-3: Mild disc bulging and mild  facet and ligamentum flavum hypertrophy without stenosis, unchanged.  L3-4: Disc bulging and mild facet and ligamentum flavum hypertrophy without stenosis, unchanged.  L4-5: Mild disc bulging and mild facet and ligamentum flavum hypertrophy without stenosis, unchanged.  L5-S1: Shallow left paracentral to subarticular disc protrusion/disc osteophyte complex and mild facet hypertrophy without stenosis, unchanged.  IMPRESSION: Unchanged mild lumbar disc and facet degeneration without stenosis.  Electronically Signed   By: Logan Bores M.D.   On: 01/19/2022 13:06 Note: Reviewed        Physical Exam  General appearance: Well nourished, well developed, and well hydrated. In no apparent acute distress Mental status: Alert, oriented x 3 (person, place, & time)       Respiratory: No evidence of acute respiratory distress Eyes: PERLA Vitals: BP (!) 159/75   Pulse 73   Temp 98.6 F (37 C)   Resp 18   Ht $R'5\' 11"'To$  (1.803 m)   Wt 240 lb (108.9 kg)   SpO2 100%   BMI 33.47 kg/m  BMI: Estimated body mass index is 33.47 kg/m as calculated from the following:   Height as of this encounter: $RemoveBeforeD'5\' 11"'PGIDmjOwHorwtw$  (1.803 m).   Weight as of this encounter: 240 lb (108.9 kg). Ideal: Ideal body weight: 75.3 kg (166 lb 0.1 oz) Adjusted ideal body weight: 88.7 kg (195 lb 9.7 oz)  Cervical Spine Area Exam  Skin & Axial Inspection: No masses, redness, edema, swelling, or associated skin lesions Alignment: Symmetrical Functional ROM: Unrestricted ROM      Stability: No instability detected Muscle Tone/Strength: Functionally intact. No obvious neuro-muscular anomalies detected. Sensory (Neurological): Musculoskeletal pain pattern Palpation: No palpable anomalies             Upper Extremity (UE) Exam  Side: Right upper extremity  Side: Left upper extremity  Skin & Extremity Inspection: Skin color, temperature, and hair growth are WNL. No peripheral edema or cyanosis. No masses, redness, swelling, asymmetry, or  associated skin lesions. No contractures.  Skin & Extremity Inspection: Skin color, temperature, and hair growth are WNL. No peripheral edema or cyanosis. No masses, redness, swelling, asymmetry, or associated skin lesions. No contractures.  Functional ROM: Unrestricted ROM          Functional ROM: Pain restricted ROM for shoulder and elbow rotator cuff dysfunction  Muscle Tone/Strength: Functionally intact. No obvious neuro-muscular anomalies detected.  Muscle Tone/Strength: Functionally intact. No obvious neuro-muscular anomalies detected.  Sensory (Neurological): Unimpaired          Sensory (Neurological): Musculoskeletal pain pattern        arthropathic arthralgia  Palpation: No palpable anomalies              Palpation: No palpable anomalies              Provocative Test(s):  Phalen's test: deferred Tinel's test: deferred Apley's scratch test (touch opposite shoulder):  Action 1 (Across chest): deferred Action 2 (Overhead): deferred Action 3 (LB reach): deferred   Provocative Test(s):  Phalen's test: deferred Tinel's test: deferred Apley's scratch test (touch opposite shoulder):  Action 1 (Across chest): Decreased ROM Action 2 (Overhead): Decreased ROM Action 3 (LB reach): Decreased ROM       Lumbar Spine Area Exam  Skin & Axial Inspection: No masses, redness, or swelling Alignment: Symmetrical Functional ROM: Pain restricted ROM affecting primarily the left Stability: No instability detected Muscle Tone/Strength: Functionally intact. No obvious neuro-muscular anomalies detected. Sensory (Neurological): Musculoskeletal pain pattern Palpation: Complains of area being tender to palpation       Provocative Tests: Hyperextension/rotation test: (+) bilaterally for facet joint pain. Lumbar quadrant test (Kemp's test): (+) on the left for foraminal stenosis left greater than right Lateral bending test: (+) ipsilateral radicular pain, on the left. Positive for left-sided foraminal  stenosis.   Gait & Posture Assessment  Ambulation: Unassisted Gait: Relatively normal for age and body habitus Posture: WNL  Lower Extremity Exam      Side: Right lower extremity   Side: Left lower extremity  Stability: No instability observed           Stability: No instability observed          Skin & Extremity Inspection: Skin color, temperature, and hair growth are WNL. No peripheral edema or cyanosis. No masses, redness, swelling, asymmetry, or associated skin lesions. No contractures.   Skin & Extremity Inspection: Skin color, temperature, and hair growth are WNL. No peripheral edema or cyanosis. No masses, redness, swelling, asymmetry, or associated skin lesions. No contractures.  Functional ROM: Pain restricted ROM for hip joint           Functional ROM:  Pain restricted motion for the left hip joint                 Muscle Tone/Strength: Functionally intact. No obvious neuro-muscular anomalies detected.   Muscle Tone/Strength: Functionally intact. No obvious neuro-muscular anomalies detected.  Sensory (Neurological): Unimpaired         Sensory (Neurological): Unimpaired        DTR: Patellar: deferred today Achilles: deferred today Plantar: deferred today   DTR: Patellar: deferred today Achilles: deferred today Plantar: deferred today  Palpation: No palpable anomalies   Palpation: No palpable anomalies        Assessment  Diagnosis Status  1. Neuropathic pain   2. Radicular pain (lumbar)   3. Primary osteoarthritis of left shoulder   4. Left rotator cuff tear arthropathy   5. Lumbar radicular pain   6. Cervicalgia   7. Chronic pain syndrome    Increased pain Increased pain Increased pain   Updated Problems: Problem  Primary Osteoarthritis of Left Shoulder  Left Rotator Cuff Tear Arthropathy  Cervicalgia     Plan of Care  1. Neuropathic pain - Lumbar Epidural Injection; Future  2. Radicular pain (lumbar) - Lumbar Epidural Injection; Future  3. Primary  osteoarthritis of left shoulder - SUPRASCAPULAR NERVE BLOCK; Future  4. Left rotator cuff tear arthropathy - SUPRASCAPULAR NERVE BLOCK; Future  5. Lumbar radicular pain - Lumbar Epidural Injection; Future  6. Cervicalgia - TRIGGER POINT INJECTION; Future  7. Chronic pain syndrome - Lumbar Epidural Injection; Future  Continue with physical therapy exercises, stretching.  Continue with gabapentin 600 mg 3 times a day, alpha lipoic acid, Effexor as prescribed.  Orders:  Orders Placed This Encounter  Procedures   TRIGGER POINT INJECTION    Standing Status:   Future    Standing Expiration Date:   08/15/2022    Scheduling Instructions:     Cervicalgia    Order Specific Question:   Where will this procedure be performed?    Answer:   [facility]   Lumbar Epidural Injection    Standing Status:   Future    Standing Expiration Date:   08/15/2022    Scheduling Instructions:     Procedure: Interlaminar Lumbar Epidural Steroid injection (LESI)            Laterality: Midline     Left L1/L2    Order Specific Question:   Where will this procedure be performed?    Answer:   [facility]   SUPRASCAPULAR NERVE BLOCK    For shoulder pain.    Standing Status:   Future    Standing Expiration Date:   08/15/2022    Scheduling Instructions:     Purpose: Diagnostic     Laterality: LEFT     Level(s): Suprascapular notch     Sedation: without    Order Specific Question:   Where will this procedure be performed?    Answer:   [facility]   Follow-up plan:   Return in about 2 weeks (around 05/29/2022) for L1/L2 ESI, left SSNB and cervical TPI , in clinic NS (block 60 mins).    Recent Visits No visits were found meeting these conditions. Showing recent visits within past 90 days and meeting all other requirements Today's Visits Date Type Provider Dept  05/15/22 Office Visit [name], MD [facility]  Showing today's visits and meeting all other  requirements Future Appointments No visits were found meeting these conditions. Showing future appointments within next 90 days and meeting all other requirements  I discussed the assessment and treatment plan with the patient. The patient was provided an opportunity to ask questions and all were answered. The patient agreed with the plan and demonstrated an understanding of the instructions.  Patient advised to call back or seek an in-person evaluation if the symptoms or condition worsens.  Duration of encounter: 61minutes.  Total time on encounter, as per AMA guidelines included both the face-to-face and non-face-to-face time personally spent by the physician and/or other qualified health care professional(s) on the day of the encounter (includes time in activities that require the physician or other  qualified health care professional and does not include time in activities normally performed by clinical staff). Physician's time may include the following activities when performed: preparing to see the patient (eg, review of tests, pre-charting review of records) obtaining and/or reviewing separately obtained history performing a medically appropriate examination and/or evaluation counseling and educating the patient/family/caregiver ordering medications, tests, or procedures referring and communicating with other health care professionals (when not separately reported) documenting clinical information in the electronic or other health record independently interpreting results (not separately reported) and communicating results to the patient/ family/caregiver care coordination (not separately reported)  Note by: [name], MD Date: 05/15/2022; Time: 1:46 PM

## 2022-05-15 NOTE — Progress Notes (Signed)
Safety precautions to be maintained throughout the outpatient stay will include: orient to surroundings, keep bed in low position, maintain call bell within reach at all times, provide assistance with transfer out of bed and ambulation.  

## 2022-05-28 ENCOUNTER — Other Ambulatory Visit: Payer: Self-pay | Admitting: Orthopedic Surgery

## 2022-05-28 DIAGNOSIS — M25512 Pain in left shoulder: Secondary | ICD-10-CM

## 2022-06-03 ENCOUNTER — Ambulatory Visit
Admission: RE | Admit: 2022-06-03 | Discharge: 2022-06-03 | Disposition: A | Discharge status: Home / Self Care | Payer: No Typology Code available for payment source | Source: Ambulatory Visit | Attending: Orthopedic Surgery | Admitting: Orthopedic Surgery

## 2022-06-03 DIAGNOSIS — M25512 Pain in left shoulder: Secondary | ICD-10-CM | POA: Insufficient documentation

## 2022-06-11 ENCOUNTER — Telehealth: Payer: Self-pay | Admitting: Student in an Organized Health Care Education/Training Program

## 2022-06-11 NOTE — Telephone Encounter (Signed)
PT wanted to know if his MRI results has came in.

## 2022-06-11 NOTE — Telephone Encounter (Signed)
Called Patient, no answer. Dr Cherylann Ratel states he would be glad to discuss MRI but he did not order it. Patient should reach out to the MD that ordered it. Patient does not have an appointment with Dr Cherylann Ratel for an appt.Marland Kitchen

## 2022-06-11 NOTE — Telephone Encounter (Signed)
Looks like you ordered a procedure for him. Would you like for me to call him and tell him that you will discuss MRI on his next  appointment?

## 2022-08-20 ENCOUNTER — Encounter: Payer: Self-pay | Admitting: Student in an Organized Health Care Education/Training Program

## 2022-08-20 ENCOUNTER — Ambulatory Visit
Payer: No Typology Code available for payment source | Attending: Student in an Organized Health Care Education/Training Program | Admitting: Student in an Organized Health Care Education/Training Program

## 2022-08-20 ENCOUNTER — Ambulatory Visit
Admission: RE | Admit: 2022-08-20 | Discharge: 2022-08-20 | Disposition: A | Discharge status: Home / Self Care | Payer: No Typology Code available for payment source | Source: Ambulatory Visit | Attending: Student in an Organized Health Care Education/Training Program | Admitting: Student in an Organized Health Care Education/Training Program

## 2022-08-20 VITALS — BP 140/77 | HR 81 | Temp 96.8°F | Resp 16 | Ht 71.0 in | Wt 238.0 lb

## 2022-08-20 DIAGNOSIS — M12812 Other specific arthropathies, not elsewhere classified, left shoulder: Secondary | ICD-10-CM | POA: Diagnosis present

## 2022-08-20 DIAGNOSIS — M5410 Radiculopathy, site unspecified: Secondary | ICD-10-CM | POA: Diagnosis present

## 2022-08-20 DIAGNOSIS — M19012 Primary osteoarthritis, left shoulder: Secondary | ICD-10-CM | POA: Insufficient documentation

## 2022-08-20 DIAGNOSIS — M5416 Radiculopathy, lumbar region: Secondary | ICD-10-CM | POA: Insufficient documentation

## 2022-08-20 DIAGNOSIS — M542 Cervicalgia: Secondary | ICD-10-CM | POA: Diagnosis not present

## 2022-08-20 DIAGNOSIS — M75102 Unspecified rotator cuff tear or rupture of left shoulder, not specified as traumatic: Secondary | ICD-10-CM | POA: Diagnosis not present

## 2022-08-20 DIAGNOSIS — M7918 Myalgia, other site: Secondary | ICD-10-CM | POA: Diagnosis present

## 2022-08-20 MED ORDER — ROPIVACAINE HCL 2 MG/ML IJ SOLN
2.0000 mL | Freq: Once | INTRAMUSCULAR | Status: AC
  Administered 2022-08-20: 2 mL via EPIDURAL

## 2022-08-20 MED ORDER — DEXAMETHASONE SODIUM PHOSPHATE 10 MG/ML IJ SOLN
10.0000 mg | Freq: Once | INTRAMUSCULAR | Status: AC
  Administered 2022-08-20: 10 mg
  Filled 2022-08-20: qty 1

## 2022-08-20 MED ORDER — SODIUM CHLORIDE 0.9% FLUSH
2.0000 mL | Freq: Once | INTRAVENOUS | Status: AC
  Administered 2022-08-20: 2 mL

## 2022-08-20 MED ORDER — IOHEXOL 180 MG/ML  SOLN
10.0000 mL | Freq: Once | INTRAMUSCULAR | Status: AC
  Administered 2022-08-20: 10 mL via EPIDURAL
  Filled 2022-08-20: qty 20

## 2022-08-20 MED ORDER — LIDOCAINE HCL 2 % IJ SOLN
20.0000 mL | Freq: Once | INTRAMUSCULAR | Status: AC
  Administered 2022-08-20: 400 mg
  Filled 2022-08-20: qty 20

## 2022-08-20 MED ORDER — METHYLPREDNISOLONE ACETATE 40 MG/ML IJ SUSP
40.0000 mg | Freq: Once | INTRAMUSCULAR | Status: AC
  Administered 2022-08-20: 40 mg via INTRA_ARTICULAR
  Filled 2022-08-20: qty 1

## 2022-08-20 MED ORDER — ROPIVACAINE HCL 2 MG/ML IJ SOLN
9.0000 mL | Freq: Once | INTRAMUSCULAR | Status: AC
  Administered 2022-08-20: 9 mL via PERINEURAL
  Filled 2022-08-20: qty 20

## 2022-08-20 NOTE — Progress Notes (Signed)
PROVIDER NOTE: Interpretation of information contained herein should be left to medically-trained personnel. Specific patient instructions are provided elsewhere under "Patient Instructions" section of medical record. This document was created in part using STT-dictation technology, any transcriptional errors that may result from this process are unintentional.  Patient: Jordan Welch Type: Established DOB: November 12, 1960 MRN: [id number] [facility]: [facility]  Service: Procedure DOS: 08/20/2022 Setting: Ambulatory Location: Ambulatory outpatient facility Delivery: Face-to-face Provider: Edward Jolly, MD Specialty: Interventional Pain Management Specialty designation: 09 Location: Outpatient facility Ref. Prov.: Edward Jolly, MD    Primary Reason for Visit: Interventional Pain Management Treatment. CC: Back Pain (lower), Neck Pain, and Shoulder Pain (left)   Procedure:           Type: Lumbar epidural steroid injection (LESI) (interlaminar) #1    Laterality: Midline   Level:  L1-2 Level.  Imaging: Fluoroscopic guidance         Anesthesia: Local anesthesia (1-2% Lidocaine) DOS: 08/20/2022  Performed by: Edward Jolly, MD  Purpose: Diagnostic/Therapeutic Indications: Lumbar radicular pain of intraspinal etiology of more than 4 weeks that has failed to respond to conservative therapy and is severe enough to impact quality of life or function. 1. Radicular pain (lumbar)   2. Lumbar radicular pain   3. Primary osteoarthritis of left shoulder   4. Left rotator cuff tear arthropathy    NAS-11 Pain score:   Pre-procedure: 2 /10   Post-procedure: 2 /10      Position / Prep / Materials:  Position: Prone w/ head of the table raised (slight reverse trendelenburg) to facilitate breathing.  Prep solution: DuraPrep (Iodine Povacrylex [0.7% available iodine] and Isopropyl Alcohol, 74% w/w) Prep Area: Entire Posterior Lumbar Region from lower scapular tip down to mid buttocks area and from flank to  flank. Materials:  Tray: Epidural tray Needle(s):  Type: Epidural needle (Tuohy) Gauge (G):  22 Length: Regular (3.5-in) Qty: 1  Pre-op H&P Assessment:  Jordan Welch is a 62 y.o. (year old), male patient, seen today for interventional treatment. He  has a past surgical history that includes Foot surgery (Right). Jordan Welch has a current medication list which includes the following prescription(s): alpha-lipoic acid, atorvastatin, empagliflozin, gabapentin, losartan, metformin, pantoprazole, psyllium, trazodone, venlafaxine, and loratadine. His primarily concern today is the Back Pain (lower), Neck Pain, and Shoulder Pain (left)  Initial Vital Signs:  Pulse/HCG Rate: 81ECG Heart Rate: 78 Temp: (!) 96.8 F (36 C) Resp: 17 BP: (!) 124/57 SpO2: 100 %  BMI: Estimated body mass index is 33.19 kg/m as calculated from the following:   Height as of this encounter: 5\' 11"  (1.803 m).   Weight as of this encounter: 238 lb (108 kg).  Risk Assessment: Allergies: Reviewed. He has No Known Allergies.  Allergy Precautions: None required Coagulopathies: Reviewed. None identified.  Blood-thinner therapy: None at this time Active Infection(s): Reviewed. None identified. Jordan Welch is afebrile  Site Confirmation: Jordan Welch was asked to confirm the procedure and laterality before marking the site Procedure checklist: Completed Consent: Before the procedure and under the influence of no sedative(s), amnesic(s), or anxiolytics, the patient was informed of the treatment options, risks and possible complications. To fulfill our ethical and legal obligations, as recommended by the American Medical Association's Code of Ethics, I have informed the patient of my clinical impression; the nature and purpose of the treatment or procedure; the risks, benefits, and possible complications of the intervention; the alternatives, including doing nothing; the risk(s) and benefit(s) of the alternative treatment(s) or procedure(s);  and the risk(s) and benefit(s) of doing nothing. The patient was provided information about the general risks and possible complications associated with the procedure. These may include, but are not limited to: failure to achieve desired goals, infection, bleeding, organ or nerve damage, allergic reactions, paralysis, and death. In addition, the patient was informed of those risks and complications associated to Spine-related procedures, such as failure to decrease pain; infection (i.e.: Meningitis, epidural or intraspinal abscess); bleeding (i.e.: epidural hematoma, subarachnoid hemorrhage, or any other type of intraspinal or peri-dural bleeding); organ or nerve damage (i.e.: Any type of peripheral nerve, nerve root, or spinal cord injury) with subsequent damage to sensory, motor, and/or autonomic systems, resulting in permanent pain, numbness, and/or weakness of one or several areas of the body; allergic reactions; (i.e.: anaphylactic reaction); and/or death. Furthermore, the patient was informed of those risks and complications associated with the medications. These include, but are not limited to: allergic reactions (i.e.: anaphylactic or anaphylactoid reaction(s)); adrenal axis suppression; blood sugar elevation that in diabetics may result in ketoacidosis or comma; water retention that in patients with history of congestive heart failure may result in shortness of breath, pulmonary edema, and decompensation with resultant heart failure; weight gain; swelling or edema; medication-induced neural toxicity; particulate matter embolism and blood vessel occlusion with resultant organ, and/or nervous system infarction; and/or aseptic necrosis of one or more joints. Finally, the patient was informed that Medicine is not an exact science; therefore, there is also the possibility of unforeseen or unpredictable risks and/or possible complications that may result in a catastrophic outcome. The patient indicated having  understood very clearly. We have given the patient no guarantees and we have made no promises. Enough time was given to the patient to ask questions, all of which were answered to the patient's satisfaction. Mr. Pacella has indicated that he wanted to continue with the procedure. Attestation: I, the ordering provider, attest that I have discussed with the patient the benefits, risks, side-effects, alternatives, likelihood of achieving goals, and potential problems during recovery for the procedure that I have provided informed consent. Date  Time: 08/20/2022 10:46 AM  Pre-Procedure Preparation:  Monitoring: As per clinic protocol. Respiration, ETCO2, SpO2, BP, heart rate and rhythm monitor placed and checked for adequate function Safety Precautions: Patient was assessed for positional comfort and pressure points before starting the procedure. Time-out: I initiated and conducted the "Time-out" before starting the procedure, as per protocol. The patient was asked to participate by confirming the accuracy of the "Time Out" information. Verification of the correct person, site, and procedure were performed and confirmed by me, the nursing staff, and the patient. "Time-out" conducted as per Joint Commission's Universal Protocol (UP.01.01.01). Time: 1149  Description/Narrative of Procedure:          Target: Epidural space via interlaminar opening, initially targeting the lower laminar border of the superior vertebral body. Region: Lumbar Approach: Percutaneous paravertebral  Rationale (medical necessity): procedure needed and proper for the diagnosis and/or treatment of the patient's medical symptoms and needs. Procedural Technique Safety Precautions: Aspiration looking for blood return was conducted prior to all injections. At no point did we inject any substances, as a needle was being advanced. No attempts were made at seeking any paresthesias. Safe injection practices and needle disposal techniques used.  Medications properly checked for expiration dates. SDV (single dose vial) medications used. Description of the Procedure: Protocol guidelines were followed. The procedure needle was introduced through the skin, ipsilateral to the reported pain, and advanced to the  target area. Bone was contacted and the needle walked caudad, until the lamina was cleared. The epidural space was identified using "loss-of-resistance technique" with 2-3 ml of PF-NaCl (0.9% NSS), in a 5cc LOR glass syringe.  6 cc solution made of 3 cc of preservative-free saline, 2 cc of 0.2% ropivacaine, 1 cc of Decadron 10 mg/cc.   Vitals:   08/20/22 1140 08/20/22 1146 08/20/22 1151 08/20/22 1153  BP: (!) 151/53 (!) 135/90 (!) 141/81 (!) 140/77  Pulse:      Resp: 18 16 18 16   Temp:      TempSrc:      SpO2: 99% 98% 98% 98%  Weight:      Height:        Start Time: 1149 hrs. End Time: 1152 hrs.  Imaging Guidance (Spinal):          Type of Imaging Technique: Fluoroscopy Guidance (Spinal) Indication(s): Assistance in needle guidance and placement for procedures requiring needle placement in or near specific anatomical locations not easily accessible without such assistance. Exposure Time: Please see nurses notes. Contrast: Before injecting any contrast, we confirmed that the patient did not have an allergy to iodine, shellfish, or radiological contrast. Once satisfactory needle placement was completed at the desired level, radiological contrast was injected. Contrast injected under live fluoroscopy. No contrast complications. See chart for type and volume of contrast used. Fluoroscopic Guidance: I was personally present during the use of fluoroscopy. "Tunnel Vision Technique" used to obtain the best possible view of the target area. Parallax error corrected before commencing the procedure. "Direction-depth-direction" technique used to introduce the needle under continuous pulsed fluoroscopy. Once target was reached,  antero-posterior, oblique, and lateral fluoroscopic projection used confirm needle placement in all planes. Images permanently stored in EMR. Interpretation: I personally interpreted the imaging intraoperatively. Adequate needle placement confirmed in multiple planes. Appropriate spread of contrast into desired area was observed. No evidence of afferent or efferent intravascular uptake. No intrathecal or subarachnoid spread observed. Permanent images saved into the patient's record.  Antibiotic Prophylaxis:   Anti-infectives (From admission, onward)    None      Indication(s): None identified  Post-operative Assessment:  Post-procedure Vital Signs:  Pulse/HCG Rate: 8178 Temp: (!) 96.8 F (36 C) Resp: 16 BP: (!) 140/77 SpO2: 98 %  EBL: None  Complications: No immediate post-treatment complications observed by team, or reported by patient.  Note: The patient tolerated the entire procedure well. A repeat set of vitals were taken after the procedure and the patient was kept under observation following institutional policy, for this type of procedure. Post-procedural neurological assessment was performed, showing return to baseline, prior to discharge. The patient was provided with post-procedure discharge instructions, including a section on how to identify potential problems. Should any problems arise concerning this procedure, the patient was given instructions to immediately contact us, at any time, without hesitation. In any case, we plan to contact the patient by telephone for a follow-up status report regarding this interventional procedure.  Comments:  No additional relevant information.  Plan of Care  Orders:  Orders Placed This Encounter  Procedures   DG PAIN CL[facility] C-ARM 1-60 MIN NO REPORT    Intraoperative interpretation by procedural physician at [facility].    Standing Status:   Standing    Number of Occurrences:   1    Order Specific Question:   Reason for  exam:    Answer:   Assistance in needle guidance and placement for procedures requiring needle placement  in or near specific anatomical locations not easily accessible without such assistance.     Medications ordered for procedure: Meds ordered this encounter  Medications   iohexol (OMNIPAQUE) 180 MG/ML injection 10 mL    Must be Myelogram-compatible. If not available, you may substitute with a water-soluble, non-ionic, hypoallergenic, myelogram-compatible radiological contrast medium.   lidocaine (XYLOCAINE) 2 % (with pres) injection 400 mg   ropivacaine (PF) 2 mg/mL (0.2%) (NAROPIN) injection 2 mL   sodium chloride flush (NS) 0.9 % injection 2 mL   dexamethasone (DECADRON) injection 10 mg   ropivacaine (PF) 2 mg/mL (0.2%) (NAROPIN) injection 9 mL   methylPREDNISolone acetate (DEPO-MEDROL) injection 40 mg   Medications administered: We administered iohexol, lidocaine, ropivacaine (PF) 2 mg/mL (0.2%), sodium chloride flush, dexamethasone, ropivacaine (PF) 2 mg/mL (0.2%), and methylPREDNISolone acetate.  See the medical record for exact dosing, route, and time of administration.  Follow-up plan:   Return in about 5 weeks (around 09/24/2022) for Post Procedure Evaluation, in person.      Recent Visits No visits were found meeting these conditions. Showing recent visits within past 90 days and meeting all other requirements Today's Visits Date Type Provider Dept  08/20/22 Procedure visit [name], MD [facility]  Showing today's visits and meeting all other requirements Future Appointments No visits were found meeting these conditions. Showing future appointments within next 90 days and meeting all other requirements  Disposition: Discharge home  Discharge (Date  Time): 08/20/2022; 1202 hrs.   Primary Care Physician: [facility] Location: [facility] Note by: [name], MD Date: 08/20/2022; Time: 11:57 AM  Disclaimer:   Medicine is not an exact science. The only guarantee in medicine is that nothing is guaranteed. It is important to note that the decision to proceed with this intervention was based on the information collected from the patient. The Data and conclusions were drawn from the patient's questionnaire, the interview, and the physical examination. Because the information was provided in large part by the patient, it cannot be guaranteed that it has not been purposely or unconsciously manipulated. Every effort has been made to obtain as much relevant data as possible for this evaluation. It is important to note that the conclusions that lead to this procedure are derived in large part from the available data. Always take into account that the treatment will also be dependent on availability of resources and existing treatment guidelines, considered by other Pain Management Practitioners as being common knowledge and practice, at the time of the intervention. For Medico-Legal purposes, it is also important to point out that variation in procedural techniques and pharmacological choices are the acceptable norm. The indications, contraindications, technique, and results of the above procedure should only be interpreted and judged by a Board-Certified Interventional Pain Specialist with extensive familiarity and expertise in the same exact procedure and technique.

## 2022-08-20 NOTE — Patient Instructions (Addendum)
Radiofrequency Ablation Radiofrequency ablation is a procedure that is performed to relieve pain. The procedure is often used for back, neck, or arm pain. Radiofrequency ablation involves the use of a machine that creates radio waves to make heat. During the procedure, the heat is applied to the nerve that carries the pain signal. The heat damages the nerve and interferes with the pain signal. Pain relief usually starts about 2 weeks after the procedure and lasts for 6 months to 1 year. Tell a health care provider about: Any allergies you have. All medicines you are taking, including vitamins, herbs, eye drops, creams, and over-the-counter medicines. Any problems you or family members have had with anesthetic medicines. Any bleeding problems you have. Any surgeries you have had. Any medical conditions you have. Whether you are pregnant or may be pregnant. What are the risks? Generally, this is a safe procedure. However, problems may occur, including: Pain or soreness at the injection site. Allergic reaction to medicines given during the procedure. Bleeding. Infection at the injection site. Damage to nerves or blood vessels. What happens before the procedure? When to stop eating and drinking Follow instructions from your health care provider about what you may eat and drink before your procedure. These may include: 8 hours before the procedure Stop eating most foods. Do not eat meat, fried foods, or fatty foods. Eat only light foods, such as toast or crackers. All liquids are okay except energy drinks and alcohol. 6 hours before the procedure Stop eating. Drink only clear liquids, such as water, clear fruit juice, black coffee, plain tea, and sports drinks. Do not drink energy drinks or alcohol. 2 hours before the procedure Stop drinking all liquids. You may be allowed to take medicine with small sips of water. If you do not follow your health care provider's instructions, your  procedure may be delayed or canceled. Medicines Ask your health care provider about: Changing or stopping your regular medicines. This is especially important if you are taking diabetes medicines or blood thinners. Taking medicines such as aspirin and ibuprofen. These medicines can thin your blood. Do not take these medicines unless your health care provider tells you to take them. Taking over-the-counter medicines, vitamins, herbs, and supplements. General instructions Ask your health care provider what steps will be taken to help prevent infection. These steps may include: Removing hair at the procedure site. Washing skin with a germ-killing soap. Taking antibiotic medicine. If you will be going home right after the procedure, plan to have a responsible adult: Take you home from the hospital or clinic. You will not be allowed to drive. Care for you for the time you are told. What happens during the procedure?  You will be awake during the procedure. You will need to be able to talk with the health care provider during the procedure. An IV will be inserted into one of your veins. You will be given one or more of the following: A medicine to help you relax (sedative). A medicine to numb the area (local anesthetic). Your health care provider will insert a radiofrequency needle into the area to be treated. This is done with the help of fluoroscopy. A wire that carries the radio waves (electrode) will be put through the radiofrequency needle. An electrical pulse will be sent through the electrode to verify the correct nerve that is causing your pain. You will feel a tingling sensation, and you may have muscle twitching. The tissue around the needle tip will be heated by an  electric current that comes from the radiofrequency machine. This will numb the nerves. The needle will be removed. A bandage (dressing) will be put on the insertion area. The procedure may vary among health care providers  and hospitals. What happens after the procedure? Your blood pressure, heart rate, breathing rate, and blood oxygen level will be monitored until you leave the hospital or clinic. Return to your normal activities as told by your health care provider. Ask your health care provider what activities are safe for you. If you were given a sedative during the procedure, it can affect you for several hours. Do not drive or operate machinery until your health care provider says that it is safe. Summary Radiofrequency ablation is a procedure that is performed to relieve pain. The procedure is often used for back, neck, or arm pain. Radiofrequency ablation involves the use of a machine that creates radio waves to make heat. Plan to have a responsible adult take you home from the hospital or clinic. Do not drive or operate machinery until your health care provider says that it is safe. Return to your normal activities as told by your health care provider. Ask your health care provider what activities are safe for you. This information is not intended to replace advice given to you by your health care provider. Make sure you discuss any questions you have with your health care provider. Document Revised: 12/27/2020 Document Reviewed: 12/27/2020 Elsevier Patient Education  [place].  ____________________________________________________________________________________________  Post-Procedure Discharge Instructions  Instructions: Apply ice:  Purpose: This will minimize any swelling and discomfort after procedure.  When: Day of procedure, as soon as you get home. How: Fill a plastic sandwich bag with crushed ice. Cover it with a small towel and apply to injection site. How long: (15 min on, 15 min off) Apply for 15 minutes then remove x 15 minutes.  Repeat sequence on day of procedure, until you go to bed. Apply heat:  Purpose: To treat any soreness and discomfort from the procedure. When: Starting  the next day after the procedure. How: Apply heat to procedure site starting the day following the procedure. How long: May continue to repeat daily, until discomfort goes away. Food intake: Start with clear liquids (like water) and advance to regular food, as tolerated.  Physical activities: Keep activities to a minimum for the first 8 hours after the procedure. After that, then as tolerated. Driving: If you have received any sedation, be responsible and do not drive. You are not allowed to drive for 24 hours after having sedation. Blood thinner: (Applies only to those taking blood thinners) You may restart your blood thinner 6 hours after your procedure. Insulin: (Applies only to Diabetic patients taking insulin) As soon as you can eat, you may resume your normal dosing schedule. Infection prevention: Keep procedure site clean and dry. Shower daily and clean area with soap and water. Post-procedure Pain Diary: Extremely important that this be done correctly and accurately. Recorded information will be used to determine the next step in treatment. For the purpose of accuracy, follow these rules: Evaluate only the area treated. Do not report or include pain from an untreated area. For the purpose of this evaluation, ignore all other areas of pain, except for the treated area. After your procedure, avoid taking a long nap and attempting to complete the pain diary after you wake up. Instead, set your alarm clock to go off every hour, on the hour, for the initial 8 hours after  the procedure. Document the duration of the numbing medicine, and the relief you are getting from it. Do not go to sleep and attempt to complete it later. It will not be accurate. If you received sedation, it is likely that you were given a medication that may cause amnesia. Because of this, completing the diary at a later time may cause the information to be inaccurate. This information is needed to plan your care. Follow-up  appointment: Keep your post-procedure follow-up evaluation appointment after the procedure (usually 2 weeks for most procedures, 6 weeks for radiofrequencies). DO NOT FORGET to bring you pain diary with you.   Expect: (What should I expect to see with my procedure?) From numbing medicine (AKA: Local Anesthetics): Numbness or decrease in pain. You may also experience some weakness, which if present, could last for the duration of the local anesthetic. Onset: Full effect within 15 minutes of injected. Duration: It will depend on the type of local anesthetic used. On the average, 1 to 8 hours.  From steroids (Applies only if steroids were used): Decrease in swelling or inflammation. Once inflammation is improved, relief of the pain will follow. Onset of benefits: Depends on the amount of swelling present. The more swelling, the longer it will take for the benefits to be seen. In some cases, up to 10 days. Duration: Steroids will stay in the system x 2 weeks. Duration of benefits will depend on multiple posibilities including persistent irritating factors. Side-effects: If present, they may typically last 2 weeks (the duration of the steroids). Frequent: Cramps (if they occur, drink Gatorade and take over-the-counter Magnesium 450-500 mg once to twice a day); water retention with temporary weight gain; increases in blood sugar; decreased immune system response; increased appetite. Occasional: Facial flushing (red, warm cheeks); mood swings; menstrual changes. Uncommon: Long-term decrease or suppression of natural hormones; bone thinning. (These are more common with higher doses or more frequent use. This is why we prefer that our patients avoid having any injection therapies in other practices.)  Very Rare: Severe mood changes; psychosis; aseptic necrosis. From procedure: Some discomfort is to be expected once the numbing medicine wears off. This should be minimal if ice and heat are applied as  instructed.  Call if: (When should I call?) You experience numbness and weakness that gets worse with time, as opposed to wearing off. New onset bowel or bladder incontinence. (Applies only to procedures done in the spine)  Emergency Numbers: Durning business hours (Monday - Thursday, 8:00 AM - 4:00 PM) (Friday, 9:00 AM - 12:00 Noon): [phone] After hours: [phone] NOTE: If you are having a problem and are unable connect with, or to talk to a provider, then go to your nearest urgent care or emergency department. If the problem is serious and urgent, please call 911. ____________________________________________________________________________________________   Epidural Steroid Injection Patient Information  Description: The epidural space surrounds the nerves as they exit the spinal cord.  In some patients, the nerves can be compressed and inflamed by a bulging disc or a tight spinal canal (spinal stenosis).  By injecting steroids into the epidural space, we can bring irritated nerves into direct contact with a potentially helpful medication.  These steroids act directly on the irritated nerves and can reduce swelling and inflammation which often leads to decreased pain.  Epidural steroids may be injected anywhere along the spine and from the neck to the low back depending upon the location of your pain.   After numbing the skin with  local anesthetic (like Novocaine), a small needle is passed into the epidural space slowly.  You may experience a sensation of pressure while this is being done.  The entire block usually last less than 10 minutes.  Conditions which may be treated by epidural steroids:  Low back and leg pain Neck and arm pain Spinal stenosis Post-laminectomy syndrome Herpes zoster (shingles) pain Pain from compression fractures  Preparation for the injection:  Do not eat any solid food or dairy products within 8 hours of your appointment.  You may drink clear  liquids up to 3 hours before appointment.  Clear liquids include water, black coffee, juice or soda.  No milk or cream please. You may take your regular medication, including pain medications, with a sip of water before your appointment  Diabetics should hold regular insulin (if taken separately) and take 1/2 normal NPH dos the morning of the procedure.  Carry some sugar containing items with you to your appointment. A driver must accompany you and be prepared to drive you home after your procedure.  Bring all your current medications with your. An IV may be inserted and sedation may be given at the discretion of the physician.   A blood pressure cuff, EKG and other monitors will often be applied during the procedure.  Some patients may need to have extra oxygen administered for a short period. You will be asked to provide medical information, including your allergies, prior to the procedure.  We must know immediately if you are taking blood thinners (like Coumadin/Warfarin)  Or if you are allergic to IV iodine contrast (dye). We must know if you could possible be pregnant.  Possible side-effects: Bleeding from needle site Infection (rare, may require surgery) Nerve injury (rare) Numbness & tingling (temporary) Difficulty urinating (rare, temporary) Spinal headache ( a headache worse with upright posture) Light -headedness (temporary) Pain at injection site (several days) Decreased blood pressure (temporary) Weakness in arm/leg (temporary) Pressure sensation in back/neck (temporary)  Call if you experience: Fever/chills associated with headache or increased back/neck pain. Headache worsened by an upright position. New onset weakness or numbness of an extremity below the injection site Hives or difficulty breathing (go to the emergency room) Inflammation or drainage at the infection site Severe back/neck pain Any new symptoms which are concerning to you  Please note:  Although the  local anesthetic injected can often make your back or neck feel good for several hours after the injection, the pain will likely return.  It takes 3-7 days for steroids to work in the epidural space.  You may not notice any pain relief for at least that one week.  If effective, we will often do a series of three injections spaced 3-6 weeks apart to maximally decrease your pain.  After the initial series, we generally will wait several months before considering a repeat injection of the same type.  If you have any questions, please call [phone] [facility]

## 2022-08-20 NOTE — Progress Notes (Signed)
PROVIDER NOTE: Interpretation of information contained herein should be left to medically-trained personnel. Specific patient instructions are provided elsewhere under "Patient Instructions" section of medical record. This document was created in part using STT-dictation technology, any transcriptional errors that may result from this process are unintentional.  Patient: Jordan Welch Type: Established DOB: Apr 28, 1961 MRN: [id number] [facility]: [facility]  Service: Procedure DOS: 08/20/2022 Setting: Ambulatory Location: Ambulatory outpatient facility Delivery: Face-to-face Provider: [name], MD Specialty: Interventional Pain Management Specialty designation: 09 Location: Outpatient facility Ref. Prov.: [name], MD    Primary Reason for Visit: Interventional Pain Management Treatment. 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  Imaging: Fluoroscopic guidance         Anesthesia: Local anesthesia (1-2% Lidocaine) DOS: 08/20/2022  Performed by: [name], MD  Purpose: Diagnostic/Therapeutic Indications: Shoulder pain, severe enough to impact quality of life and/or function. 1. Radicular pain (lumbar)   2. Lumbar radicular pain   3. Primary osteoarthritis of left shoulder   4. Left rotator cuff tear arthropathy    NAS-11 score:   Pre-procedure: 2 /10   Post-procedure: 0-No pain/10     Target: Suprascapular nerve Location: midway between the medial border of the scapula and the acromion as it runs through the suprascapular notch. Region: Suprascapular, posterior shoulder  Approach: Percutaneous  Neuroanatomy: The suprascapular nerve is the lateral branch of the superior trunk of the brachial plexus. It receives nerve fibers that  originate in the nerve roots C5 and C6 (and sometimes C4). It is a mixed nerve, meaning that it provides both sensory and motor supply for the suprascapular region. Function: The main function of this nerve is to provide motor innervation for two muscles, the supraspinatus and infraspinatus muscles. They are part of the rotator cuff muscles. In addition, the suprascapular nerve provides a sensory supply to the joints of the scapula (glenohumeral and acromioclavicular joints). Rationale (medical necessity): procedure needed and proper for the diagnosis and/or treatment of the patient's medical symptoms and needs.  Position / Prep / Materials:  Position: Prone Materials:  Tray: Block Needle(s):  Type: Spinal  Gauge (G): 22  Length: 3.5 in.  Qty: 1 Prep solution: DuraPrep (Iodine Povacrylex [0.7% available iodine] and Isopropyl Alcohol, 74% w/w) Prep Area: Entire posterior shoulder area. From upper spine to shoulder proper (upper arm), and from lateral neck to lower tip of shoulder blade.   Pre-op H&P Assessment:  Jordan Welch is a 62 y.o. (year old), male patient, seen today for interventional treatment. He  has a past surgical history that includes Foot surgery (Right). Jordan Welch has a current medication list which includes the following prescription(s): alpha-lipoic acid, atorvastatin, empagliflozin, gabapentin, losartan, metformin, pantoprazole, psyllium, trazodone, venlafaxine, and loratadine. His primarily concern today is the Back Pain (lower), Neck Pain, and Shoulder Pain (left)  Initial Vital Signs:  Pulse/HCG Rate: 81ECG Heart Rate: 78 Temp: (!) 96.8 F (36 C) Resp: 17 BP: (!) 124/57 SpO2: 100 %  BMI: Estimated body mass index is 33.19 kg/m as calculated from the following:   Height as of this encounter: 5\' 11"  (1.803 m).   Weight as of this encounter: 238 lb (108 kg).  Risk Assessment: Allergies: Reviewed. He has No Known Allergies.  Allergy Precautions: None  required  Coagulopathies: Reviewed. None identified.  Blood-thinner therapy: None at this time Active Infection(s): Reviewed. None identified. Jordan Welch is afebrile  Site Confirmation: Jordan Welch was asked to confirm the procedure and laterality before marking the site Procedure checklist: Completed Consent: Before the procedure and under the influence of no sedative(s), amnesic(s), or anxiolytics, the patient was informed of the treatment options, risks and possible complications. To fulfill our ethical and legal obligations, as recommended by the American Medical Association's Code of Ethics, I have informed the patient of my clinical impression; the nature and purpose of the treatment or procedure; the risks, benefits, and possible complications of the intervention; the alternatives, including doing nothing; the risk(s) and benefit(s) of the alternative treatment(s) or procedure(s); and the risk(s) and benefit(s) of doing nothing. The patient was provided information about the general risks and possible complications associated with the procedure. These may include, but are not limited to: failure to achieve desired goals, infection, bleeding, organ or nerve damage, allergic reactions, paralysis, and death. In addition, the patient was informed of those risks and complications associated to the procedure, such as failure to decrease pain; infection; bleeding; organ or nerve damage with subsequent damage to sensory, motor, and/or autonomic systems, resulting in permanent pain, numbness, and/or weakness of one or several areas of the body; allergic reactions; (i.e.: anaphylactic reaction); and/or death. Furthermore, the patient was informed of those risks and complications associated with the medications. These include, but are not limited to: allergic reactions (i.e.: anaphylactic or anaphylactoid reaction(s)); adrenal axis suppression; blood sugar elevation that in diabetics may result in ketoacidosis or  comma; water retention that in patients with history of congestive heart failure may result in shortness of breath, pulmonary edema, and decompensation with resultant heart failure; weight gain; swelling or edema; medication-induced neural toxicity; particulate matter embolism and blood vessel occlusion with resultant organ, and/or nervous system infarction; and/or aseptic necrosis of one or more joints. Finally, the patient was informed that Medicine is not an exact science; therefore, there is also the possibility of unforeseen or unpredictable risks and/or possible complications that may result in a catastrophic outcome. The patient indicated having understood very clearly. We have given the patient no guarantees and we have made no promises. Enough time was given to the patient to ask questions, all of which were answered to the patient's satisfaction. Mr. Iseman has indicated that he wanted to continue with the procedure. Attestation: I, the ordering provider, attest that I have discussed with the patient the benefits, risks, side-effects, alternatives, likelihood of achieving goals, and potential problems during recovery for the procedure that I have provided informed consent. Date  Time: 08/20/2022 10:46 AM  Pre-Procedure Preparation:  Monitoring: As per clinic protocol. Respiration, ETCO2, SpO2, BP, heart rate and rhythm monitor placed and checked for adequate function Safety Precautions: Patient was assessed for positional comfort and pressure points before starting the procedure. Time-out: I initiated and conducted the "Time-out" before starting the procedure, as per protocol. The patient was asked to participate by confirming the accuracy of the "Time Out" information. Verification of the correct person, site, and procedure were performed and confirmed by me, the nursing staff, and the patient. "Time-out" conducted as per Joint Commission's Universal Protocol (UP.01.01.01). Time:  1149  Description of Procedure:          Procedural Technique Safety Precautions: Aspiration looking for blood return was conducted prior to all injections. At no point did we inject any substances, as a needle was being advanced. No attempts  were made at seeking any paresthesias. Safe injection practices and needle disposal techniques used. Medications properly checked for expiration dates. SDV (single dose vial) medications used. Description of the Procedure: Protocol guidelines were followed. The patient was placed in position over the procedure table. The target area was identified and the area prepped in the usual manner. Skin & deeper tissues infiltrated with local anesthetic. Appropriate amount of time allowed to pass for local anesthetics to take effect. The procedure needles were then advanced to the target area. Proper needle placement secured. Negative aspiration confirmed. Solution injected in intermittent fashion, asking for systemic symptoms every 0.5cc of injectate. The needles were then removed and the area cleansed, making sure to leave some of the prepping solution back to take advantage of its long term bactericidal properties.  5 cc solution made of 4cc of 0.2% ropivacaine, 1 cc of Decadron methylprednisolone, 40 mg/cc injected along the left suprascapular notch after contrast confirmation.  Afterwards a C7 trigger point injection was done with needling performed and 2 cc of 0.2% ropivacaine injected   Vitals:   08/20/22 1140 08/20/22 1146 08/20/22 1151 08/20/22 1153  BP: (!) 151/53 (!) 135/90 (!) 141/81 (!) 140/77  Pulse:      Resp: 18 16 18 16   Temp:      TempSrc:      SpO2: 99% 98% 98% 98%  Weight:      Height:         Start Time: 1149 hrs. End Time: 1152 hrs.  Imaging Guidance (Spinal):          Type of Imaging Technique: Fluoroscopy Guidance (Spinal) Indication(s): Assistance in needle guidance and placement for procedures requiring needle placement in or near  specific anatomical locations not easily accessible without such assistance. Exposure Time: Please see nurses notes. Contrast: Before injecting any contrast, we confirmed that the patient did not have an allergy to iodine, shellfish, or radiological contrast. Once satisfactory needle placement was completed at the desired level, radiological contrast was injected. Contrast injected under live fluoroscopy. No contrast complications. See chart for type and volume of contrast used. Fluoroscopic Guidance: I was personally present during the use of fluoroscopy. "Tunnel Vision Technique" used to obtain the best possible view of the target area. Parallax error corrected before commencing the procedure. "Direction-depth-direction" technique used to introduce the needle under continuous pulsed fluoroscopy. Once target was reached, antero-posterior, oblique, and lateral fluoroscopic projection used confirm needle placement in all planes. Images permanently stored in EMR. Interpretation: I personally interpreted the imaging intraoperatively. Adequate needle placement confirmed in multiple planes. Appropriate spread of contrast into desired area was observed. No evidence of afferent or efferent intravascular uptake. No intrathecal or subarachnoid spread observed. Permanent images saved into the patient's record.  Antibiotic Prophylaxis:   Anti-infectives (From admission, onward)    None      Indication(s): None identified  Post-operative Assessment:  Post-procedure Vital Signs:  Pulse/HCG Rate: 8178 Temp: (!) 96.8 F (36 C) Resp: 16 BP: (!) 140/77 SpO2: 98 %  EBL: None  Complications: No immediate post-treatment complications observed by team, or reported by patient.  Note: The patient tolerated the entire procedure well. A repeat set of vitals were taken after the procedure and the patient was kept under observation following institutional policy, for this type of procedure. Post-procedural  neurological assessment was performed, showing return to baseline, prior to discharge. The patient was provided with post-procedure discharge instructions, including a section on how to identify potential problems. Should any problems arise  concerning this procedure, the patient was given instructions to immediately contact us, at any time, without hesitation. In any case, we plan to contact the patient by telephone for a follow-up status report regarding this interventional procedure.  Comments:  No additional relevant information.  Plan of Care  Orders:  Orders Placed This Encounter  Procedures   DG PA[facility] C-ARM 1-60 MIN NO REPORT    Intraoperative interpretation by procedural physician at [facility].    Standing Status:   Standing    Number of Occurrences:   1    Order Specific Question:   Reason for exam:    Answer:   Assistance in needle guidance and placement for procedures requiring needle placement in or near specific anatomical locations not easily accessible without such assistance.     Medications ordered for procedure: Meds ordered this encounter  Medications   iohexol (OMNIPAQUE) 180 MG/ML injection 10 mL    Must be Myelogram-compatible. If not available, you may substitute with a water-soluble, non-ionic, hypoallergenic, myelogram-compatible radiological contrast medium.   lidocaine (XYLOCAINE) 2 % (with pres) injection 400 mg   ropivacaine (PF) 2 mg/mL (0.2%) (NAROPIN) injection 2 mL   sodium chloride flush (NS) 0.9 % injection 2 mL   dexamethasone (DECADRON) injection 10 mg   ropivacaine (PF) 2 mg/mL (0.2%) (NAROPIN) injection 9 mL   methylPREDNISolone acetate (DEPO-MEDROL) injection 40 mg   Medications administered: We administered iohexol, lidocaine, ropivacaine (PF) 2 mg/mL (0.2%), sodium chloride flush, dexamethasone, ropivacaine (PF) 2 mg/mL (0.2%), and methylPREDNISolone acetate.  See the medical record for exact dosing, route, and time of  administration.  Follow-up plan:   Return in about 5 weeks (around 09/24/2022) for Post Procedure Evaluation, in person.      Recent Visits No visits were found meeting these conditions. Showing recent visits within past 90 days and meeting all other requirements Today's Visits Date Type Provider Dept  08/20/22 Procedure visit [name], MD [facility]  Showing today's visits and meeting all other requirements Future Appointments Date Type Provider Dept  09/24/22 Appointment [name], MD [facility]  Showing future appointments within next 90 days and meeting all other requirements  Disposition: Discharge home  Discharge (Date  Time): 08/20/2022; 1202 hrs.   Primary Care Physician: [facility] Location: [facility] Facility Note by: [name], MD Date: 08/20/2022; Time: 2:33 PM  Disclaimer:  Medicine is not an exact science. The only guarantee in medicine is that nothing is guaranteed. It is important to note that the decision to proceed with this intervention was based on the information collected from the patient. The Data and conclusions were drawn from the patient's questionnaire, the interview, and the physical examination. Because the information was provided in large part by the patient, it cannot be guaranteed that it has not been purposely or unconsciously manipulated. Every effort has been made to obtain as much relevant data as possible for this evaluation. It is important to note that the conclusions that lead to this procedure are derived in large part from the available data. Always take into account that the treatment will also be dependent on availability of resources and existing treatment guidelines, considered by other Pain Management Practitioners as being common knowledge and practice, at the time of the intervention. For Medico-Legal purposes, it is also important to point out that variation in procedural  techniques and pharmacological choices are the acceptable norm. The indications, contraindications, technique, and results of the above procedure  should only be interpreted and judged by a Board-Certified Interventional Pain Specialist with extensive familiarity and expertise in the same exact procedure and technique.

## 2022-08-20 NOTE — Progress Notes (Signed)
Safety precautions to be maintained throughout the outpatient stay will include: orient to surroundings, keep bed in low position, maintain call bell within reach at all times, provide assistance with transfer out of bed and ambulation.  

## 2022-08-21 ENCOUNTER — Telehealth: Payer: Self-pay

## 2022-08-21 NOTE — Telephone Encounter (Signed)
Post procedure follow up.  LM 

## 2022-09-24 ENCOUNTER — Ambulatory Visit
Payer: No Typology Code available for payment source | Attending: Student in an Organized Health Care Education/Training Program | Admitting: Student in an Organized Health Care Education/Training Program

## 2022-09-24 ENCOUNTER — Encounter: Payer: Self-pay | Admitting: Student in an Organized Health Care Education/Training Program

## 2022-09-24 VITALS — BP 147/76 | HR 82 | Temp 96.8°F | Resp 14 | Ht 71.0 in | Wt 232.0 lb

## 2022-09-24 DIAGNOSIS — M12812 Other specific arthropathies, not elsewhere classified, left shoulder: Secondary | ICD-10-CM

## 2022-09-24 DIAGNOSIS — M19012 Primary osteoarthritis, left shoulder: Secondary | ICD-10-CM

## 2022-09-24 DIAGNOSIS — M5416 Radiculopathy, lumbar region: Secondary | ICD-10-CM

## 2022-09-24 DIAGNOSIS — M5410 Radiculopathy, site unspecified: Secondary | ICD-10-CM | POA: Diagnosis not present

## 2022-09-24 DIAGNOSIS — M7918 Myalgia, other site: Secondary | ICD-10-CM

## 2022-09-24 DIAGNOSIS — M75102 Unspecified rotator cuff tear or rupture of left shoulder, not specified as traumatic: Secondary | ICD-10-CM

## 2022-09-24 DIAGNOSIS — G894 Chronic pain syndrome: Secondary | ICD-10-CM

## 2022-09-24 NOTE — Patient Instructions (Signed)
______________________________________________________________________  Preparing for your procedure  Appointments: If you think you may not be able to keep your appointment, call 24-48 hours in advance to cancel. We need time to make it available to others.  During your procedure appointment there will be: No Prescription Refills. No disability issues to discussed. No medication changes or discussions.  Instructions: Food intake: Avoid eating anything solid for at least 8 hours prior to your procedure. Clear liquid intake: You may take clear liquids such as water up to 2 hours prior to your procedure. (No carbonated drinks. No soda.) Transportation: Unless otherwise stated by your physician, bring a driver. Morning Medicines: Except for blood thinners, take all of your other morning medications with a sip of water. Make sure to take your heart and blood pressure medicines. If your blood pressure's lower number is above 100, the case will be rescheduled. Blood thinners: Make sure to stop your blood thinners as instructed.  If you take a blood thinner, but were not instructed to stop it, call our office [phone] and ask to talk to a nurse. Not stopping a blood thinner prior to certain procedures could lead to serious complications. Diabetics on insulin: Notify the staff so that you can be scheduled 1st case in the morning. If your diabetes requires high dose insulin, take only  of your normal insulin dose the morning of the procedure and notify the staff that you have done so. Preventing infections: Shower with an antibacterial soap the morning of your procedure.  Build-up your immune system: Take 1000 mg of Vitamin C with every meal (3 times a day) the day prior to your procedure. Antibiotics: Inform the nursing staff if you are taking any antibiotics or if you have any conditions that may require antibiotics prior to procedures. (Example: recent joint implants)   Pregnancy: If you are  pregnant make sure to notify the nursing staff. Not doing so may result in injury to the fetus, including death.  Sickness: If you have a cold, fever, or any active infections, call and cancel or reschedule your procedure. Receiving steroids while having an infection may result in complications. Arrival: You must be in the facility at least 30 minutes prior to your scheduled procedure. Tardiness: Your scheduled time is also the cutoff time. If you do not arrive at least 15 minutes prior to your procedure, you will be rescheduled.  Children: Do not bring any children with you. Make arrangements to keep them home. Dress appropriately: There is always a possibility that your clothing may get soiled. Avoid long dresses. Valuables: Do not bring any jewelry or valuables.  Reasons to call and reschedule or cancel your procedure: (Following these recommendations will minimize the risk of a serious complication.) Surgeries: Avoid having procedures within 2 weeks of any surgery. (Avoid for 2 weeks before or after any surgery). Flu Shots: Avoid having procedures within 2 weeks of a flu shots or . (Avoid for 2 weeks before or after immunizations). Barium: Avoid having a procedure within 7-10 days after having had a radiological study involving the use of radiological contrast. (Myelograms, Barium swallow or enema study). Heart attacks: Avoid any elective procedures or surgeries for the initial 6 months after a "Myocardial Infarction" (Heart Attack). Blood thinners: It is imperative that you stop these medications before procedures. Let us know if you if you take any blood thinner.  Infection: Avoid procedures during or within two weeks of an infection (including chest colds or gastrointestinal problems). Symptoms associated with infections  include: Localized redness, fever, chills, night sweats or profuse sweating, burning sensation when voiding, cough, congestion, stuffiness, runny nose, sore throat, diarrhea,  nausea, vomiting, cold or Flu symptoms, recent or current infections. It is specially important if the infection is over the area that we intend to treat. Heart and lung problems: Symptoms that may suggest an active cardiopulmonary problem include: cough, chest pain, breathing difficulties or shortness of breath, dizziness, ankle swelling, uncontrolled high or unusually low blood pressure, and/or palpitations. If you are experiencing any of these symptoms, cancel your procedure and contact your primary care physician for an evaluation.  Remember:  Regular Business hours are:  Monday to Thursday 8:00 AM to 4:00 PM  Provider's Schedule: Milinda Pointer, MD:  Procedure days: Tuesday and Thursday 7:30 AM to 4:00 PM  [name], MD:  Procedure days: Monday and Wednesday 7:30 AM to 4:00 PM  ______________________________________________________________________

## 2022-09-24 NOTE — Progress Notes (Signed)
PROVIDER NOTE: Information contained herein reflects review and annotations entered in association with encounter. Interpretation of such information and data should be left to medically-trained personnel. Information provided to patient can be located elsewhere in the medical record under "Patient Instructions". Document created using STT-dictation technology, any transcriptional errors that may result from process are unintentional.    Patient: Jordan Welch  Service Category: E/M  Provider: [name], MD  DOB: [birth year]  DOS: 09/24/2022  Referring Provider: [facility]  MRN: [id number]  Specialty: Interventional Pain Management  PCP: [facility]  Type: Established Patient  Setting: Ambulatory outpatient    Location: Office  Delivery: Face-to-face     HPI  Mr. Mancel Kintzel, a 62 y.o. year old male, is here today because of his Radicular pain [M54.10]. Mr. Kerbo primary complain today is Back Pain (lower) Last encounter: My last encounter with him was on 08/20/2022. Pertinent problems: Mr. 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 Pain Assessment: Severity of Chronic pain is reported as a 1 /10. Location: Back Lower/denies. Onset: More than a month ago. Quality: Aching. Timing: Intermittent. Modifying factor(s): rest. Vitals:  height is '5\' 11"'$  (1.803 m) and weight is 232 lb (105.2 kg). His temporal temperature is 96.8 F (36 C) (abnormal). His blood pressure is 147/76 (abnormal) and his pulse is 82. His respiration is 14 and oxygen saturation is 100%.  BMI: Estimated body mass index is 32.36 kg/m as calculated from the following:   Height  as of this encounter: '5\' 11"'$  (1.803 m).   Weight as of this encounter: 232 lb (105.2 kg).  Reason for encounter: post-procedure evaluation and assessment.    Post-procedure evaluation   Type: Suprascapular nerve block (SSNB) #1 and cervical TPI for cervical myofascial pain syndrome Laterality:  Left Level: Superior to scapular spine, lateral to supraspinatus fossa (Suprascapular notch).  Imaging: Fluoroscopic guidance         Anesthesia: Local anesthesia (1-2% Lidocaine) DOS: 08/20/2022  Performed by: [name], MD  Procedure #2: L1-L2 ESI  1. Radicular pain (lumbar)   2. Lumbar radicular pain   3. Primary osteoarthritis of left shoulder   4. Left rotator cuff tear arthropathy    NAS-11 score:   Pre-procedure: 2 /10   Post-procedure: 0-No pain/10      Effectiveness:  Initial hour after procedure: 100 %  Subsequent 4-6 hours post-procedure: 100 %  Analgesia past initial 6 hours: 100 %  Ongoing improvement:  Analgesic:  100% Function: Mr. Pendelton reports improvement in function ROM: Mr. Marana reports improvement in ROM   ROS  Constitutional: Denies any fever or chills Gastrointestinal: No reported hemesis, hematochezia, vomiting, or acute GI distress Musculoskeletal: Denies any acute onset joint swelling, redness, loss of ROM, or weakness Neurological: No reported episodes of acute onset apraxia, aphasia, dysarthria, agnosia, amnesia, paralysis, loss of coordination, or loss of consciousness  Medication Review  Alpha-Lipoic Acid, Empagliflozin, atorvastatin, gabapentin, losartan, metFORMIN, pantoprazole, psyllium, traZODone, and venlafaxine  History Review  Allergy: Mr. Schlesser has No Known Allergies. Drug: Mr. Rigley  reports no history of drug use. Alcohol:  reports current alcohol use. Tobacco:  reports that he has never smoked. He has never used smokeless tobacco. Social: Mr. Saladino  reports that he has never smoked. He has never used smokeless tobacco. He reports current  alcohol use. He reports that he does not use drugs. Medical:  has a past medical history of Anxiety, High cholesterol, and Reflux. Surgical: Mr. Jennison  has a past surgical history that includes Foot surgery (Right). Family: family history is not on file.  Laboratory Chemistry Profile   Renal No results found for: "BUN", "CREATININE", "LABCREA", "BCR", "GFR", "GFRAA", "GFRNONAA", "LABVMA", "EPIRU", "EPINEPH24HUR", "NOREPRU", "NOREPI24HUR", "DOPARU", "DOPAM24HRUR"  Hepatic No results found for: "AST", "ALT", "ALBUMIN", "ALKPHOS", "HCVAB", "AMYLASE", "LIPASE", "AMMONIA"  Electrolytes No results found for: "NA", "K", "CL", "CALCIUM", "MG", "PHOS"  Bone No results found for: "VD25OH", "VD125OH2TOT", "[id number]", "[id number]", "25OHVITD1", "25OHVITD2", "25OHVITD3", "TESTOFREE", "TESTOSTERONE"  Inflammation (CRP: Acute Phase) (ESR: Chronic Phase) No results found for: "CRP", "ESRSEDRATE", "LATICACIDVEN"       Note: Above Lab results reviewed.   Physical Exam  General appearance: Well nourished, well developed, and well hydrated. In no apparent acute distress Mental status: Alert, oriented x 3 (person, place, & time)       Respiratory: No evidence of acute respiratory distress Eyes: PERLA Vitals: BP (!) 147/76   Pulse 82   Temp (!) 96.8 F (36 C) (Temporal)   Resp 14   Ht '5\' 11"'$  (1.803 m)   Wt 232 lb (105.2 kg)   SpO2 100%   BMI 32.36 kg/m  BMI: Estimated body mass index is 32.36 kg/m as calculated from the following:   Height as of this encounter: '5\' 11"'$  (1.803 m).   Weight as of this encounter: 232 lb (105.2 kg). Ideal: Ideal body weight: 75.3 kg (166 lb 0.1 oz) Adjusted ideal body weight: 87.3 kg (192 lb 6.5 oz)  Assessment   Diagnosis Status  1. Radicular pain (lumbar)   2. Lumbar radicular pain   3. Primary osteoarthritis of left shoulder   4. Left rotator cuff tear arthropathy   5. Cervical myofascial pain syndrome   6. Chronic pain syndrome     Controlled Controlled Controlled     Plan of Care  Overall great response to left suprascapular nerve block for left shoulder pain and midline L1-L2 lumbar epidural steroid injection.  Patient notes improvement in left shoulder range of motion and improvement in his low back pain as well.  He has retired.  He is planning to go to [facility] in May and would like to have these injections repeated before he leaves.  Orders:  Orders Placed This Encounter  Procedures   SUPRASCAPULAR NERVE BLOCK    For shoulder pain.    Standing Status:   Future    Standing Expiration Date:   12/25/2022    Scheduling Instructions:     Purpose: Left     Level(s): Suprascapular notch    Order Specific Question:   Where will this procedure be performed?    Answer:   [facility]   Lumbar Epidural Injection    Standing Status:   Future    Standing Expiration Date:   12/25/2022    Scheduling Instructions:     L1-L2 midline without sedation    Order Specific Question:   Where will this procedure be performed?    Answer:   [facility]   Follow-up plan:   Return in about 2 months (around 11/28/2022) for L1/2 ESI + Left SSNB, in clinic [facility].      Recent Visits Date Type Provider Dept  08/20/22 Procedure visit [name], MD [facility]  Showing recent visits within past 90 days and meeting all other requirements Today's Visits Date Type Provider Dept  09/24/22 Office Visit [name], MD [facility]  Showing today's visits and meeting all other requirements Future Appointments Date Type Provider Dept  11/28/22 Appointment [name], MD [facility]  Showing future appointments within next 90 days and meeting all other requirements  I discussed the assessment and treatment plan with the patient. The patient was provided an opportunity to ask questions and all were answered. The patient agreed with the plan and demonstrated an understanding  of the instructions.  Patient advised to call back or seek an in-person evaluation if the symptoms or condition worsens.  Duration of encounter: 29mnutes.  Total time on encounter, as per AMA guidelines included both the face-to-face and non-face-to-face time personally spent by the physician and/or other qualified health care professional(s) on the day of the encounter (includes time in activities that require the physician or other qualified health care professional and does not include time in activities normally performed by clinical staff). Physician's time may include the following activities when performed: Preparing to see the patient (e.g., pre-charting review of records, searching for previously ordered imaging, lab work, and nerve conduction tests) Review of prior analgesic pharmacotherapies. Reviewing PMP Interpreting ordered tests (e.g., lab work, imaging, nerve conduction tests) Performing post-procedure evaluations, including interpretation of diagnostic procedures Obtaining and/or reviewing separately obtained history Performing a medically appropriate examination and/or evaluation Counseling and educating the patient/family/caregiver Ordering medications, tests, or procedures Referring and communicating with other health care professionals (when not separately reported) Documenting clinical information in the electronic or other health record Independently interpreting results (not separately reported) and communicating results to the patient/ family/caregiver Care coordination (not separately reported)  Note by: BGillis Santa MD Date: 09/24/2022; Time: 1:37 PM

## 2022-11-28 ENCOUNTER — Ambulatory Visit
Payer: No Typology Code available for payment source | Attending: Student in an Organized Health Care Education/Training Program | Admitting: Student in an Organized Health Care Education/Training Program

## 2022-11-28 ENCOUNTER — Ambulatory Visit
Admission: RE | Admit: 2022-11-28 | Discharge: 2022-11-28 | Disposition: A | Discharge status: Home / Self Care | Payer: No Typology Code available for payment source | Source: Ambulatory Visit | Attending: Student in an Organized Health Care Education/Training Program | Admitting: Student in an Organized Health Care Education/Training Program

## 2022-11-28 ENCOUNTER — Encounter: Payer: Self-pay | Admitting: Student in an Organized Health Care Education/Training Program

## 2022-11-28 VITALS — BP 173/83 | HR 83 | Temp 97.2°F | Resp 18 | Ht 71.0 in | Wt 234.0 lb

## 2022-11-28 DIAGNOSIS — G894 Chronic pain syndrome: Secondary | ICD-10-CM | POA: Diagnosis present

## 2022-11-28 DIAGNOSIS — M5410 Radiculopathy, site unspecified: Secondary | ICD-10-CM | POA: Diagnosis present

## 2022-11-28 DIAGNOSIS — M542 Cervicalgia: Secondary | ICD-10-CM | POA: Diagnosis present

## 2022-11-28 DIAGNOSIS — M5416 Radiculopathy, lumbar region: Secondary | ICD-10-CM

## 2022-11-28 MED ORDER — IOHEXOL 180 MG/ML  SOLN
10.0000 mL | Freq: Once | INTRAMUSCULAR | Status: AC
  Administered 2022-11-28: 10 mL via EPIDURAL

## 2022-11-28 MED ORDER — DEXAMETHASONE SODIUM PHOSPHATE 10 MG/ML IJ SOLN
INTRAMUSCULAR | Status: AC
  Filled 2022-11-28: qty 2

## 2022-11-28 MED ORDER — LIDOCAINE HCL 2 % IJ SOLN
20.0000 mL | Freq: Once | INTRAMUSCULAR | Status: AC
  Administered 2022-11-28: 100 mg

## 2022-11-28 MED ORDER — DEXAMETHASONE SODIUM PHOSPHATE 10 MG/ML IJ SOLN
10.0000 mg | Freq: Once | INTRAMUSCULAR | Status: AC
  Administered 2022-11-28: 10 mg

## 2022-11-28 MED ORDER — LIDOCAINE HCL (PF) 2 % IJ SOLN
INTRAMUSCULAR | Status: AC
  Filled 2022-11-28: qty 10

## 2022-11-28 MED ORDER — ROPIVACAINE HCL 2 MG/ML IJ SOLN
INTRAMUSCULAR | Status: AC
  Filled 2022-11-28: qty 20

## 2022-11-28 MED ORDER — SODIUM CHLORIDE (PF) 0.9 % IJ SOLN
INTRAMUSCULAR | Status: AC
  Filled 2022-11-28: qty 10

## 2022-11-28 MED ORDER — IOHEXOL 180 MG/ML  SOLN
INTRAMUSCULAR | Status: AC
  Filled 2022-11-28: qty 20

## 2022-11-28 MED ORDER — SODIUM CHLORIDE 0.9% FLUSH
2.0000 mL | Freq: Once | INTRAVENOUS | Status: AC
  Administered 2022-11-28: 2 mL

## 2022-11-28 MED ORDER — ROPIVACAINE HCL 2 MG/ML IJ SOLN
2.0000 mL | Freq: Once | INTRAMUSCULAR | Status: AC
  Administered 2022-11-28: 2 mL via EPIDURAL

## 2022-11-28 NOTE — Progress Notes (Signed)
Safety precautions to be maintained throughout the outpatient stay will include: orient to surroundings, keep bed in low position, maintain call bell within reach at all times, provide assistance with transfer out of bed and ambulation.  

## 2022-11-28 NOTE — Progress Notes (Signed)
PROVIDER NOTE: Interpretation of information contained herein should be left to medically-trained personnel. Specific patient instructions are provided elsewhere under "Patient Instructions" section of medical record. This document was created in part using STT-dictation technology, any transcriptional errors that may result from this process are unintentional.  Patient: Jordan Welch Type: Established DOB: 10-08-1960 MRN: [id number] PCP: [facility]  Service: Procedure DOS: 11/28/2022 Setting: Ambulatory Location: Ambulatory outpatient facility Delivery: Face-to-face Provider: Edward Jolly, MD Specialty: Interventional Pain Management Specialty designation: 09 Location: Outpatient facility Ref. Prov.: [facility]    Primary Reason for Visit: Interventional Pain Management Treatment. CC: Back Pain (lower)   Procedure:           Type: Lumbar epidural steroid injection (LESI) (interlaminar) #1   B/L Cervical TPI Laterality: Midline   Level:  L1-2 Level.  Imaging: Fluoroscopic guidance         Anesthesia: Local anesthesia (1-2% Lidocaine) DOS: 11/28/2022  Performed by: Edward Jolly, MD  Purpose: Diagnostic/Therapeutic Indications: Lumbar radicular pain of intraspinal etiology of more than 4 weeks that has failed to respond to conservative therapy and is severe enough to impact quality of life or function. 1. Radicular pain (lumbar)   2. Lumbar radicular pain   3. Chronic pain syndrome    NAS-11 Pain score:   Pre-procedure: 2 /10   Post-procedure: 2 /10      Position / Prep / Materials:  Position: Prone w/ head of the table raised (slight reverse trendelenburg) to facilitate breathing.  Prep solution: DuraPrep (Iodine Povacrylex [0.7% available iodine] and Isopropyl Alcohol, 74% w/w) Prep Area: Entire Posterior Lumbar Region from lower scapular tip down to mid buttocks area and from flank to flank. Materials:  Tray: Epidural tray Needle(s):  Type: Epidural needle  (Tuohy) Gauge (G):  22 Length: Regular (3.5-in) Qty: 1  Pre-op H&P Assessment:  Jordan Welch is a 62 y.o. (year old), male patient, seen today for interventional treatment. He  has a past surgical history that includes Foot surgery (Right). Jordan Welch has a current medication list which includes the following prescription(s): alpha-lipoic acid, atorvastatin, empagliflozin, losartan, metformin, pantoprazole, psyllium, trazodone, venlafaxine, and gabapentin. His primarily concern today is the Back Pain (lower)  Initial Vital Signs:  Pulse/HCG Rate: 83ECG Heart Rate: 88 Temp: (!) 97.2 F (36.2 C) Resp: 18 BP: (!) 153/69 SpO2: 99 %  BMI: Estimated body mass index is 32.64 kg/m as calculated from the following:   Height as of this encounter: 5\' 11"  (1.803 m).   Weight as of this encounter: 234 lb (106.1 kg).  Risk Assessment: Allergies: Reviewed. He has No Known Allergies.  Allergy Precautions: None required Coagulopathies: Reviewed. None identified.  Blood-thinner therapy: None at this time Active Infection(s): Reviewed. None identified. Jordan Welch is afebrile  Site Confirmation: Jordan Welch was asked to confirm the procedure and laterality before marking the site Procedure checklist: Completed Consent: Before the procedure and under the influence of no sedative(s), amnesic(s), or anxiolytics, the patient was informed of the treatment options, risks and possible complications. To fulfill our ethical and legal obligations, as recommended by the American Medical Association's Code of Ethics, I have informed the patient of my clinical impression; the nature and purpose of the treatment or procedure; the risks, benefits, and possible complications of the intervention; the alternatives, including doing nothing; the risk(s) and benefit(s) of the alternative treatment(s) or procedure(s); and the risk(s) and benefit(s) of doing nothing. The patient was provided information about the general risks and  possible complications associated  with the procedure. These may include, but are not limited to: failure to achieve desired goals, infection, bleeding, organ or nerve damage, allergic reactions, paralysis, and death. In addition, the patient was informed of those risks and complications associated to Spine-related procedures, such as failure to decrease pain; infection (i.e.: Meningitis, epidural or intraspinal abscess); bleeding (i.e.: epidural hematoma, subarachnoid hemorrhage, or any other type of intraspinal or peri-dural bleeding); organ or nerve damage (i.e.: Any type of peripheral nerve, nerve root, or spinal cord injury) with subsequent damage to sensory, motor, and/or autonomic systems, resulting in permanent pain, numbness, and/or weakness of one or several areas of the body; allergic reactions; (i.e.: anaphylactic reaction); and/or death. Furthermore, the patient was informed of those risks and complications associated with the medications. These include, but are not limited to: allergic reactions (i.e.: anaphylactic or anaphylactoid reaction(s)); adrenal axis suppression; blood sugar elevation that in diabetics may result in ketoacidosis or comma; water retention that in patients with history of congestive heart failure may result in shortness of breath, pulmonary edema, and decompensation with resultant heart failure; weight gain; swelling or edema; medication-induced neural toxicity; particulate matter embolism and blood vessel occlusion with resultant organ, and/or nervous system infarction; and/or aseptic necrosis of one or more joints. Finally, the patient was informed that Medicine is not an exact science; therefore, there is also the possibility of unforeseen or unpredictable risks and/or possible complications that may result in a catastrophic outcome. The patient indicated having understood very clearly. We have given the patient no guarantees and we have made no promises. Enough time was  given to the patient to ask questions, all of which were answered to the patient's satisfaction. Jordan Welch has indicated that he wanted to continue with the procedure. Attestation: I, the ordering provider, attest that I have discussed with the patient the benefits, risks, side-effects, alternatives, likelihood of achieving goals, and potential problems during recovery for the procedure that I have provided informed consent. Date  Time: 11/28/2022 11:22 AM  Pre-Procedure Preparation:  Monitoring: As per clinic protocol. Respiration, ETCO2, SpO2, BP, heart rate and rhythm monitor placed and checked for adequate function Safety Precautions: Patient was assessed for positional comfort and pressure points before starting the procedure. Time-out: I initiated and conducted the "Time-out" before starting the procedure, as per protocol. The patient was asked to participate by confirming the accuracy of the "Time Out" information. Verification of the correct person, site, and procedure were performed and confirmed by me, the nursing staff, and the patient. "Time-out" conducted as per Joint Commission's Universal Protocol (UP.01.01.01). Time: 1149  Description/Narrative of Procedure:          Target: Epidural space via interlaminar opening, initially targeting the lower laminar border of the superior vertebral body. Region: Lumbar Approach: Percutaneous paravertebral  Rationale (medical necessity): procedure needed and proper for the diagnosis and/or treatment of the patient's medical symptoms and needs. Procedural Technique Safety Precautions: Aspiration looking for blood return was conducted prior to all injections. At no point did we inject any substances, as a needle was being advanced. No attempts were made at seeking any paresthesias. Safe injection practices and needle disposal techniques used. Medications properly checked for expiration dates. SDV (single dose vial) medications used. Description of the  Procedure: Protocol guidelines were followed. The procedure needle was introduced through the skin, ipsilateral to the reported pain, and advanced to the target area. Bone was contacted and the needle walked caudad, until the lamina was cleared. The epidural space was identified using "  loss-of-resistance technique" with 2-3 ml of PF-NaCl (0.9% NSS), in a 5cc LOR glass syringe.  6 cc solution made of 3 cc of preservative-free saline, 2 cc of 0.2% ropivacaine, 1 cc of Decadron 10 mg/cc.  Afterwards bilateral cervical trigger point injection was done with 1 cc of 0.2% ropivacaine injected into the left and right cervical trigger point.   Vitals:   11/28/22 1124 11/28/22 1143 11/28/22 1149 11/28/22 1154  BP: (!) 153/69 (!) 178/93 (!) 174/90 (!) 173/83  Pulse: 83     Resp:  18 16 18   Temp: (!) 97.2 F (36.2 C)     TempSrc: Temporal     SpO2: 99% 100% 100% 99%  Weight: 234 lb (106.1 kg)     Height: 5\' 11"  (1.803 m)       Start Time: 1149 hrs. End Time: 1152 hrs.  Imaging Guidance (Spinal):          Type of Imaging Technique: Fluoroscopy Guidance (Spinal) Indication(s): Assistance in needle guidance and placement for procedures requiring needle placement in or near specific anatomical locations not easily accessible without such assistance. Exposure Time: Please see nurses notes. Contrast: Before injecting any contrast, we confirmed that the patient did not have an allergy to iodine, shellfish, or radiological contrast. Once satisfactory needle placement was completed at the desired level, radiological contrast was injected. Contrast injected under live fluoroscopy. No contrast complications. See chart for type and volume of contrast used. Fluoroscopic Guidance: I was personally present during the use of fluoroscopy. "Tunnel Vision Technique" used to obtain the best possible view of the target area. Parallax error corrected before commencing the procedure. "Direction-depth-direction" technique  used to introduce the needle under continuous pulsed fluoroscopy. Once target was reached, antero-posterior, oblique, and lateral fluoroscopic projection used confirm needle placement in all planes. Images permanently stored in EMR. Interpretation: I personally interpreted the imaging intraoperatively. Adequate needle placement confirmed in multiple planes. Appropriate spread of contrast into desired area was observed. No evidence of afferent or efferent intravascular uptake. No intrathecal or subarachnoid spread observed. Permanent images saved into the patient's record.  Antibiotic Prophylaxis:   Anti-infectives (From admission, onward)    None      Indication(s): None identified  Post-operative Assessment:  Post-procedure Vital Signs:  Pulse/HCG Rate: 8383 Temp: (!) 97.2 F (36.2 C) Resp: 18 BP: (!) 173/83 SpO2: 99 %  EBL: None  Complications: No immediate post-treatment complications observed by team, or reported by patient.  Note: The patient tolerated the entire procedure well. A repeat set of vitals were taken after the procedure and the patient was kept under observation following institutional policy, for this type of procedure. Post-procedural neurological assessment was performed, showing return to baseline, prior to discharge. The patient was provided with post-procedure discharge instructions, including a section on how to identify potential problems. Should any problems arise concerning this procedure, the patient was given instructions to immediately contact us, at any time, without hesitation. In any case, we plan to contact the patient by telephone for a follow-up status report regarding this interventional procedure.  Comments:  No additional relevant information.  Plan of Care  Orders:  Orders Placed This Encounter  Procedures   DG PAIN CL[facility] C-ARM 1-60 MIN NO REPORT    Intraoperative interpretation by procedural physician at [facility].    Standing  Status:   Standing    Number of Occurrences:   1    Order Specific Question:   Reason for exam:    Answer:  Assistance in needle guidance and placement for procedures requiring needle placement in or near specific anatomical locations not easily accessible without such assistance.     Medications ordered for procedure: Meds ordered this encounter  Medications   iohexol (OMNIPAQUE) 180 MG/ML injection 10 mL    Must be Myelogram-compatible. If not available, you may substitute with a water-soluble, non-ionic, hypoallergenic, myelogram-compatible radiological contrast medium.   lidocaine (XYLOCAINE) 2 % (with pres) injection 400 mg   sodium chloride flush (NS) 0.9 % injection 2 mL   ropivacaine (PF) 2 mg/mL (0.2%) (NAROPIN) injection 2 mL   dexamethasone (DECADRON) injection 10 mg   Medications administered: We administered iohexol, lidocaine, sodium chloride flush, ropivacaine (PF) 2 mg/mL (0.2%), and dexamethasone.  See the medical record for exact dosing, route, and time of administration.  Follow-up plan:   Return in about 6 weeks (around 01/09/2023) for Post Procedure Evaluation, virtual.      Recent Visits Date Type Provider Dept  09/24/22 Office Visit Edward Jolly, MD [facility]  Showing recent visits within past 90 days and meeting all other requirements Today's Visits Date Type Provider Dept  11/28/22 Procedure visit Edward Jolly, MD [facility]  Showing today's visits and meeting all other requirements Future Appointments Date Type Provider Dept  01/09/23 Appointment Edward Jolly, MD [facility]  Showing future appointments within next 90 days and meeting all other requirements  Disposition: Discharge home  Discharge (Date  Time): 11/28/2022; 1202 hrs.   Primary Care Physician: [facility] Location: [facility] Note by: Edward Jolly, MD Date: 11/28/2022; Time: 1:26 PM  Disclaimer:  Medicine is not  an exact science. The only guarantee in medicine is that nothing is guaranteed. It is important to note that the decision to proceed with this intervention was based on the information collected from the patient. The Data and conclusions were drawn from the patient's questionnaire, the interview, and the physical examination. Because the information was provided in large part by the patient, it cannot be guaranteed that it has not been purposely or unconsciously manipulated. Every effort has been made to obtain as much relevant data as possible for this evaluation. It is important to note that the conclusions that lead to this procedure are derived in large part from the available data. Always take into account that the treatment will also be dependent on availability of resources and existing treatment guidelines, considered by other Pain Management Practitioners as being common knowledge and practice, at the time of the intervention. For Medico-Legal purposes, it is also important to point out that variation in procedural techniques and pharmacological choices are the acceptable norm. The indications, contraindications, technique, and results of the above procedure should only be interpreted and judged by a Board-Certified Interventional Pain Specialist with extensive familiarity and expertise in the same exact procedure and technique.

## 2022-11-28 NOTE — Patient Instructions (Signed)
____________________________________________________________________________________________  Post-Procedure Discharge Instructions  Instructions: Apply ice:  Purpose: This will minimize any swelling and discomfort after procedure.  When: Day of procedure, as soon as you get home. How: Fill a plastic sandwich bag with crushed ice. Cover it with a small towel and apply to injection site. How long: (15 min on, 15 min off) Apply for 15 minutes then remove x 15 minutes.  Repeat sequence on day of procedure, until you go to bed. Apply heat:  Purpose: To treat any soreness and discomfort from the procedure. When: Starting the next day after the procedure. How: Apply heat to procedure site starting the day following the procedure. How long: May continue to repeat daily, until discomfort goes away. Food intake: Start with clear liquids (like water) and advance to regular food, as tolerated.  Physical activities: Keep activities to a minimum for the first 8 hours after the procedure. After that, then as tolerated. Driving: If you have received any sedation, be responsible and do not drive. You are not allowed to drive for 24 hours after having sedation. Blood thinner: (Applies only to those taking blood thinners) You may restart your blood thinner 6 hours after your procedure. Insulin: (Applies only to Diabetic patients taking insulin) As soon as you can eat, you may resume your normal dosing schedule. Infection prevention: Keep procedure site clean and dry. Shower daily and clean area with soap and water. Post-procedure Pain Diary: Extremely important that this be done correctly and accurately. Recorded information will be used to determine the next step in treatment. For the purpose of accuracy, follow these rules: Evaluate only the area treated. Do not report or include pain from an untreated area. For the purpose of this evaluation, ignore all other areas of pain, except for the treated area. After  your procedure, avoid taking a long nap and attempting to complete the pain diary after you wake up. Instead, set your alarm clock to go off every hour, on the hour, for the initial 8 hours after the procedure. Document the duration of the numbing medicine, and the relief you are getting from it. Do not go to sleep and attempt to complete it later. It will not be accurate. If you received sedation, it is likely that you were given a medication that may cause amnesia. Because of this, completing the diary at a later time may cause the information to be inaccurate. This information is needed to plan your care. Follow-up appointment: Keep your post-procedure follow-up evaluation appointment after the procedure (usually 2 weeks for most procedures, 6 weeks for radiofrequencies). DO NOT FORGET to bring you pain diary with you.   Expect: (What should I expect to see with my procedure?) From numbing medicine (AKA: Local Anesthetics): Numbness or decrease in pain. You may also experience some weakness, which if present, could last for the duration of the local anesthetic. Onset: Full effect within 15 minutes of injected. Duration: It will depend on the type of local anesthetic used. On the average, 1 to 8 hours.  From steroids (Applies only if steroids were used): Decrease in swelling or inflammation. Once inflammation is improved, relief of the pain will follow. Onset of benefits: Depends on the amount of swelling present. The more swelling, the longer it will take for the benefits to be seen. In some cases, up to 10 days. Duration: Steroids will stay in the system x 2 weeks. Duration of benefits will depend on multiple posibilities including persistent irritating factors. Side-effects: If present, they   may typically last 2 weeks (the duration of the steroids). Frequent: Cramps (if they occur, drink Gatorade and take over-the-counter Magnesium 450-500 mg once to twice a day); water retention with temporary  weight gain; increases in blood sugar; decreased immune system response; increased appetite. Occasional: Facial flushing (red, warm cheeks); mood swings; menstrual changes. Uncommon: Long-term decrease or suppression of natural hormones; bone thinning. (These are more common with higher doses or more frequent use. This is why we prefer that our patients avoid having any injection therapies in other practices.)  Very Rare: Severe mood changes; psychosis; aseptic necrosis. From procedure: Some discomfort is to be expected once the numbing medicine wears off. This should be minimal if ice and heat are applied as instructed.  Call if: (When should I call?) You experience numbness and weakness that gets worse with time, as opposed to wearing off. New onset bowel or bladder incontinence. (Applies only to procedures done in the spine)  Emergency Numbers: Durning business hours (Monday - Thursday, 8:00 AM - 4:00 PM) (Friday, 9:00 AM - 12:00 Noon): [phone] After hours: [phone] NOTE: If you are having a problem and are unable connect with, or to talk to a provider, then go to your nearest urgent care or emergency department. If the problem is serious and urgent, please call 911. ____________________________________________________________________________________________  Epidural Steroid Injection Patient Information  Description: The epidural space surrounds the nerves as they exit the spinal cord.  In some patients, the nerves can be compressed and inflamed by a bulging disc or a tight spinal canal (spinal stenosis).  By injecting steroids into the epidural space, we can bring irritated nerves into direct contact with a potentially helpful medication.  These steroids act directly on the irritated nerves and can reduce swelling and inflammation which often leads to decreased pain.  Epidural steroids may be injected anywhere along the spine and from the neck to the low back depending upon the  location of your pain.   After numbing the skin with local anesthetic (like Novocaine), a small needle is passed into the epidural space slowly.  You may experience a sensation of pressure while this is being done.  The entire block usually last less than 10 minutes.  Conditions which may be treated by epidural steroids:  Low back and leg pain Neck and arm pain Spinal stenosis Post-laminectomy syndrome Herpes zoster (shingles) pain Pain from compression fractures  Preparation for the injection:  Do not eat any solid food or dairy products within 8 hours of your appointment.  You may drink clear liquids up to 3 hours before appointment.  Clear liquids include water, black coffee, juice or soda.  No milk or cream please. You may take your regular medication, including pain medications, with a sip of water before your appointment  Diabetics should hold regular insulin (if taken separately) and take 1/2 normal NPH dos the morning of the procedure.  Carry some sugar containing items with you to your appointment. A driver must accompany you and be prepared to drive you home after your procedure.  Bring all your current medications with your. An IV may be inserted and sedation may be given at the discretion of the physician.   A blood pressure cuff, EKG and other monitors will often be applied during the procedure.  Some patients may need to have extra oxygen administered for a short period. You will be asked to provide medical information, including your allergies, prior to the procedure.  We must know immediately   if you are taking blood thinners (like Coumadin/Warfarin)  Or if you are allergic to IV iodine contrast (dye). We must know if you could possible be pregnant.  Possible side-effects: Bleeding from needle site Infection (rare, may require surgery) Nerve injury (rare) Numbness & tingling (temporary) Difficulty urinating (rare, temporary) Spinal headache ( a headache worse with upright  posture) Light -headedness (temporary) Pain at injection site (several days) Decreased blood pressure (temporary) Weakness in arm/leg (temporary) Pressure sensation in back/neck (temporary)  Call if you experience: Fever/chills associated with headache or increased back/neck pain. Headache worsened by an upright position. New onset weakness or numbness of an extremity below the injection site Hives or difficulty breathing (go to the emergency room) Inflammation or drainage at the infection site Severe back/neck pain Any new symptoms which are concerning to you  Please note:  Although the local anesthetic injected can often make your back or neck feel good for several hours after the injection, the pain will likely return.  It takes 3-7 days for steroids to work in the epidural space.  You may not notice any pain relief for at least that one week.  If effective, we will often do a series of three injections spaced 3-6 weeks apart to maximally decrease your pain.  After the initial series, we generally will wait several months before considering a repeat injection of the same type.  If you have any questions, please call [phone] [facility] 

## 2022-11-29 ENCOUNTER — Telehealth: Payer: Self-pay | Admitting: Student in an Organized Health Care Education/Training Program

## 2022-11-29 ENCOUNTER — Other Ambulatory Visit: Payer: Self-pay | Admitting: *Deleted

## 2022-11-29 ENCOUNTER — Telehealth: Payer: Self-pay

## 2022-11-29 NOTE — Telephone Encounter (Signed)
Post procedure follow up.  LM 

## 2022-11-29 NOTE — Telephone Encounter (Signed)
Attempted to call patient.  Would not go through.  

## 2022-11-29 NOTE — Telephone Encounter (Signed)
PT stated that Dr. Cherylann Ratel was suppose to send in prescription in for Celebrex. PT stated that he checked with pharmacy and prescription hadn't been send in. Please give patient a call. TY

## 2022-11-29 NOTE — Telephone Encounter (Signed)
error 

## 2022-11-30 NOTE — Telephone Encounter (Signed)
Attempted to call patient.  Would not go through.

## 2022-12-03 ENCOUNTER — Other Ambulatory Visit: Payer: Self-pay

## 2022-12-03 ENCOUNTER — Telehealth: Payer: Self-pay | Admitting: Student in an Organized Health Care Education/Training Program

## 2022-12-03 DIAGNOSIS — M5410 Radiculopathy, site unspecified: Secondary | ICD-10-CM

## 2022-12-03 DIAGNOSIS — G894 Chronic pain syndrome: Secondary | ICD-10-CM

## 2022-12-03 DIAGNOSIS — M5416 Radiculopathy, lumbar region: Secondary | ICD-10-CM

## 2022-12-03 NOTE — Telephone Encounter (Signed)
PT wanted to know if Lateef had send in prescription in. Please give patient a call. TY

## 2022-12-04 MED ORDER — CELECOXIB 100 MG PO CAPS: 100.0000 mg | ORAL_CAPSULE | Freq: Two times a day (BID) | ORAL | 2 refills | Status: DC

## 2022-12-04 NOTE — Addendum Note (Signed)
Addended by: Edward Jolly on: 12/04/2022 03:29 PM   Modules accepted: Orders

## 2022-12-05 NOTE — Telephone Encounter (Signed)
Patient notified via answering machine.   

## 2023-01-08 ENCOUNTER — Telehealth: Payer: Self-pay | Admitting: Student in an Organized Health Care Education/Training Program

## 2023-01-08 ENCOUNTER — Telehealth: Payer: Self-pay

## 2023-01-08 NOTE — Telephone Encounter (Signed)
Called patient about PPE no answer, left message to call before appt to discuss

## 2023-01-08 NOTE — Telephone Encounter (Signed)
PT has a vv appt schedule for tomorrow and was calling back he sorry he missed the call early. Please give patient a call. TY

## 2023-01-09 ENCOUNTER — Encounter: Payer: Self-pay | Admitting: Student in an Organized Health Care Education/Training Program

## 2023-01-09 ENCOUNTER — Ambulatory Visit
Payer: No Typology Code available for payment source | Attending: Student in an Organized Health Care Education/Training Program | Admitting: Student in an Organized Health Care Education/Training Program

## 2023-01-09 DIAGNOSIS — M25511 Pain in right shoulder: Secondary | ICD-10-CM

## 2023-01-09 DIAGNOSIS — M5416 Radiculopathy, lumbar region: Secondary | ICD-10-CM | POA: Diagnosis not present

## 2023-01-09 DIAGNOSIS — G8929 Other chronic pain: Secondary | ICD-10-CM

## 2023-01-09 DIAGNOSIS — M5410 Radiculopathy, site unspecified: Secondary | ICD-10-CM

## 2023-01-09 DIAGNOSIS — G894 Chronic pain syndrome: Secondary | ICD-10-CM | POA: Diagnosis not present

## 2023-01-09 NOTE — Progress Notes (Signed)
Patient: Jordan Welch  Service Category: E/M  Provider: Edward Jolly, MD  DOB: 02/18/1961  DOS: 01/09/2023  Location: Office  MRN: [id number]  Setting: Ambulatory outpatient  Referring Provider: [facility]  Type: Established Patient  Specialty: Interventional Pain Management  PCP: [facility]  Location: Remote location  Delivery: TeleHealth     Virtual Encounter - Pain Management PROVIDER NOTE: Information contained herein reflects review and annotations entered in association with encounter. Interpretation of such information and data should be left to medically-trained personnel. Information provided to patient can be located elsewhere in the medical record under "Patient Instructions". Document created using STT-dictation technology, any transcriptional errors that may result from process are unintentional.    Contact & Pharmacy Preferred: [phone] Home: [phone] (home) Mobile: [phone] (mobile) E-mail: [email or web address]  [facility] - [place] - [address] [place] [address] Phone: [phone] Fax: [phone]  [facility] - [place], [place] - [address] [place] [address] Phone: [phone] Fax: [phone]   Pre-screening  Mr. Pace offered "in-person" vs "virtual" encounter. He indicated preferring virtual for this encounter.   Reason COVID-19*  Social distancing based on CDC and AMA recommendations.   I contacted Adrius Lindert on 01/09/2023 via telephone.      I clearly identified myself as Edward Jolly, MD. I verified that I was speaking with the correct person using two identifiers (Name: Isiah Gagon, and date of birth: 1961/07/23).  Consent I sought verbal advanced consent from [name] for virtual visit interactions. I informed Mr. Ahlstrom of possible security and privacy concerns, risks, and limitations associated with providing "not-in-person" medical  evaluation and management services. I also informed Mr. Behringer of the availability of "in-person" appointments. Finally, I informed him that there would be a charge for the virtual visit and that he could be  personally, fully or partially, financially responsible for it. Mr. Erney expressed understanding and agreed to proceed.   Historic Elements   Mr. Blane Pask is a 62 y.o. year old, male patient evaluated today after our last contact on 01/08/2023. Mr. Casimiro  has a past medical history of Anxiety, High cholesterol, and Reflux. He also  has a past surgical history that includes Foot surgery (Right). Mr. Hewes has a current medication list which includes the following prescription(s): alpha-lipoic acid, atorvastatin, celecoxib, empagliflozin, losartan, metformin, pantoprazole, psyllium, trazodone, venlafaxine, and gabapentin. He  reports that he has never smoked. He has never used smokeless tobacco. He reports current alcohol use. He reports that he does not use drugs. Mr. Closs has No Known Allergies.  BMI: Estimated body mass index is 32.64 kg/m as calculated from the following:   Height as of 11/28/22: 5\' 11"  (1.803 m).   Weight as of 11/28/22: 234 lb (106.1 kg). Last encounter: 09/24/2022. Last procedure: 11/28/2022.  HPI  Today, he is being contacted for a post-procedure assessment.   Post-procedure evaluation   Type: Lumbar epidural steroid injection (LESI) (interlaminar) #1   B/L Cervical TPI Laterality: Midline   Level:  L1-2 Level.  Imaging: Fluoroscopic guidance         Anesthesia: Local anesthesia (1-2% Lidocaine) DOS: 11/28/2022  Performed by: Edward Jolly, MD  Purpose: Diagnostic/Therapeutic Indications: Lumbar radicular pain of intraspinal etiology of more than 4 weeks that has failed to respond to conservative therapy and is severe enough to impact quality of life or function. 1. Radicular pain (lumbar)   2. Lumbar radicular pain   3. Chronic  pain syndrome    NAS-11 Pain score:    Pre-procedure: 2 /10   Post-procedure: 2 /10      Effectiveness:  Initial hour after procedure: 100 %  Subsequent 4-6 hours post-procedure: 100 %  Analgesia past initial 6 hours: 80 % (ongoing.)  Ongoing improvement:  Analgesic:  80% Function: improved ROM: Somewhat improved   Assessment  The primary encounter diagnosis was Radicular pain (lumbar). Diagnoses of Lumbar radicular pain, Chronic pain syndrome, and Chronic right shoulder pain were also pertinent to this visit.  Plan of Care  -Good response after L-ESI, continue to monitor symptoms -Having mild-moderate right shoulder pain, no trauma, will get right shoulder xray  Orders Placed This Encounter  Procedures   DG Shoulder Right    Please make sure that the patient understands that this needs to be done as soon as possible. Never have the patient do the imaging "just before the next appointment". Inform patient that having the imaging done within the [facility] will expedite the availability of the results and will provide imaging availability to the requesting physician. In addition inform the patient that the imaging order has an expiration date and will not be renewed if not done within the active period.    Standing Status:   Future    Standing Expiration Date:   02/08/2023    Scheduling Instructions:     Imaging must be done as soon as possible. Inform patient that order will expire within 30 days and I will not renew it.    Order Specific Question:   Reason for Exam (SYMPTOM  OR DIAGNOSIS REQUIRED)    Answer:   Right shoulder pain    Order Specific Question:   Preferred imaging location?    Answer:   [facility]    Order Specific Question:   Call Results- Best Contact Number?    Answer:   [phone] [facility])    Order Specific Question:   Release to patient    Answer:   Immediate      Follow-up plan:   Return if symptoms worsen or fail to improve.      Recent Visits Date Type Provider  Dept  11/28/22 Procedure visit Edward Jolly, MD [facility]  Showing recent visits within past 90 days and meeting all other requirements Today's Visits Date Type Provider Dept  01/09/23 Office Visit Edward Jolly, MD [facility]  Showing today's visits and meeting all other requirements Future Appointments No visits were found meeting these conditions. Showing future appointments within next 90 days and meeting all other requirements  I discussed the assessment and treatment plan with the patient. The patient was provided an opportunity to ask questions and all were answered. The patient agreed with the plan and demonstrated an understanding of the instructions.  Patient advised to call back or seek an in-person evaluation if the symptoms or condition worsens.  Duration of encounter: .  Note by: Edward Jolly, MD Date: 01/09/2023; Time: 4:50 PM

## 2023-04-11 ENCOUNTER — Telehealth: Payer: Self-pay | Admitting: Student in an Organized Health Care Education/Training Program

## 2023-04-11 DIAGNOSIS — M5410 Radiculopathy, site unspecified: Secondary | ICD-10-CM

## 2023-04-11 NOTE — Telephone Encounter (Signed)
Patient wants to come in for some injections in his lumbar region. Would like to just make an appt for the injections and not have to  come in for an eval if he can. Please ask Dr Cherylann Ratel if this would be ok and we will need orders put in please.

## 2023-04-16 NOTE — Addendum Note (Signed)
Addended by: Edward Jolly on: 04/16/2023 10:11 AM   Modules accepted: Orders

## 2023-04-24 ENCOUNTER — Ambulatory Visit
Admission: RE | Admit: 2023-04-24 | Discharge: 2023-04-24 | Disposition: A | Discharge status: Home / Self Care | Payer: No Typology Code available for payment source | Source: Ambulatory Visit | Attending: Student in an Organized Health Care Education/Training Program | Admitting: Student in an Organized Health Care Education/Training Program

## 2023-04-24 ENCOUNTER — Ambulatory Visit
Payer: No Typology Code available for payment source | Attending: Student in an Organized Health Care Education/Training Program | Admitting: Student in an Organized Health Care Education/Training Program

## 2023-04-24 ENCOUNTER — Encounter: Payer: Self-pay | Admitting: Student in an Organized Health Care Education/Training Program

## 2023-04-24 VITALS — BP 144/96 | HR 81 | Temp 97.2°F | Resp 18 | Ht 71.0 in | Wt 238.0 lb

## 2023-04-24 DIAGNOSIS — M5416 Radiculopathy, lumbar region: Secondary | ICD-10-CM | POA: Diagnosis present

## 2023-04-24 DIAGNOSIS — M5410 Radiculopathy, site unspecified: Secondary | ICD-10-CM | POA: Diagnosis present

## 2023-04-24 DIAGNOSIS — G894 Chronic pain syndrome: Secondary | ICD-10-CM | POA: Insufficient documentation

## 2023-04-24 DIAGNOSIS — M542 Cervicalgia: Secondary | ICD-10-CM | POA: Diagnosis present

## 2023-04-24 MED ORDER — ROPIVACAINE HCL 2 MG/ML IJ SOLN
INTRAMUSCULAR | Status: AC
  Filled 2023-04-24: qty 20

## 2023-04-24 MED ORDER — DEXAMETHASONE SODIUM PHOSPHATE 10 MG/ML IJ SOLN
INTRAMUSCULAR | Status: AC
  Filled 2023-04-24: qty 1

## 2023-04-24 MED ORDER — LIDOCAINE HCL (PF) 2 % IJ SOLN
INTRAMUSCULAR | Status: AC
  Filled 2023-04-24: qty 10

## 2023-04-24 MED ORDER — IOHEXOL 180 MG/ML  SOLN
INTRAMUSCULAR | Status: AC
  Filled 2023-04-24: qty 20

## 2023-04-24 MED ORDER — SODIUM CHLORIDE 0.9% FLUSH
2.0000 mL | Freq: Once | INTRAVENOUS | Status: AC
  Administered 2023-04-24: 2 mL

## 2023-04-24 MED ORDER — SODIUM CHLORIDE (PF) 0.9 % IJ SOLN
INTRAMUSCULAR | Status: AC
  Filled 2023-04-24: qty 10

## 2023-04-24 MED ORDER — DEXAMETHASONE SODIUM PHOSPHATE 10 MG/ML IJ SOLN
10.0000 mg | Freq: Once | INTRAMUSCULAR | Status: AC
  Administered 2023-04-24: 10 mg

## 2023-04-24 MED ORDER — IOHEXOL 180 MG/ML  SOLN
10.0000 mL | Freq: Once | INTRAMUSCULAR | Status: AC
  Administered 2023-04-24: 10 mL via EPIDURAL

## 2023-04-24 MED ORDER — LIDOCAINE HCL 2 % IJ SOLN
20.0000 mL | Freq: Once | INTRAMUSCULAR | Status: AC
  Administered 2023-04-24: 100 mg

## 2023-04-24 MED ORDER — ROPIVACAINE HCL 2 MG/ML IJ SOLN
2.0000 mL | Freq: Once | INTRAMUSCULAR | Status: AC
  Administered 2023-04-24: 2 mL via EPIDURAL

## 2023-04-24 NOTE — Patient Instructions (Signed)

## 2023-04-24 NOTE — Progress Notes (Signed)
PROVIDER NOTE: Interpretation of information contained herein should be left to medically-trained personnel. Specific patient instructions are provided elsewhere under "Patient Instructions" section of medical record. This document was created in part using STT-dictation technology, any transcriptional errors that may result from this process are unintentional.  Patient: Jordan Welch Type: Established DOB: October 25, 1960 MRN: [id number] PCP: [facility]  Service: Procedure DOS: 04/24/2023 Setting: Ambulatory Location: Ambulatory outpatient facility Delivery: Face-to-face Provider: Edward Jolly, MD Specialty: Interventional Pain Management Specialty designation: 09 Location: Outpatient facility Ref. Prov.: [facility]    Primary Reason for Visit: Interventional Pain Management Treatment. CC: Back Pain (low)   Procedure:           Type: Lumbar epidural steroid injection (LESI) (interlaminar) #1   B/L Cervical TPI Laterality: Midline   Level:  L1-2 Level.  Imaging: Fluoroscopic guidance         Anesthesia: Local anesthesia (1-2% Lidocaine) DOS: 04/24/2023  Performed by: Edward Jolly, MD  Purpose: Diagnostic/Therapeutic Indications: Lumbar radicular pain of intraspinal etiology of more than 4 weeks that has failed to respond to conservative therapy and is severe enough to impact quality of life or function. 1. Radicular pain (lumbar)   2. Lumbar radicular pain   3. Cervicalgia   4. Chronic pain syndrome     NAS-11 Pain score:   Pre-procedure: 2 /10   Post-procedure: 3 /10      Position / Prep / Materials:  Position: Prone w/ head of the table raised (slight reverse trendelenburg) to facilitate breathing.  Prep solution: DuraPrep (Iodine Povacrylex [0.7% available iodine] and Isopropyl Alcohol, 74% w/w) Prep Area: Entire Posterior Lumbar Region from lower scapular tip down to mid buttocks area and from flank to flank. Materials:  Tray: Epidural tray Needle(s):  Type:  Epidural needle (Tuohy) Gauge (G):  22 Length: Regular (3.5-in) Qty: 1  Pre-op H&P Assessment:  Mr. Loughney is a 62 y.o. (year old), male patient, seen today for interventional treatment. He  has a past surgical history that includes Foot surgery (Right). Mr. Frezza has a current medication list which includes the following prescription(s): alpha-lipoic acid, atorvastatin, celecoxib, empagliflozin, losartan, metformin, pantoprazole, psyllium, trazodone, venlafaxine, and gabapentin. His primarily concern today is the Back Pain (low)  Initial Vital Signs:  Pulse/HCG Rate: 81ECG Heart Rate: 80 Temp: (!) 97.2 F (36.2 C) Resp: 18 BP: 131/69 SpO2: (!) 18 %  BMI: Estimated body mass index is 33.19 kg/m as calculated from the following:   Height as of this encounter: 5\' 11"  (1.803 m).   Weight as of this encounter: 238 lb (108 kg).  Risk Assessment: Allergies: Reviewed. He has No Known Allergies.  Allergy Precautions: None required Coagulopathies: Reviewed. None identified.  Blood-thinner therapy: None at this time Active Infection(s): Reviewed. None identified. Mr. Mallary is afebrile  Site Confirmation: Mr. Whitney was asked to confirm the procedure and laterality before marking the site Procedure checklist: Completed Consent: Before the procedure and under the influence of no sedative(s), amnesic(s), or anxiolytics, the patient was informed of the treatment options, risks and possible complications. To fulfill our ethical and legal obligations, as recommended by the American Medical Association's Code of Ethics, I have informed the patient of my clinical impression; the nature and purpose of the treatment or procedure; the risks, benefits, and possible complications of the intervention; the alternatives, including doing nothing; the risk(s) and benefit(s) of the alternative treatment(s) or procedure(s); and the risk(s) and benefit(s) of doing nothing. The patient was provided information about the  general risks and possible complications associated with the procedure. These may include, but are not limited to: failure to achieve desired goals, infection, bleeding, organ or nerve damage, allergic reactions, paralysis, and death. In addition, the patient was informed of those risks and complications associated to Spine-related procedures, such as failure to decrease pain; infection (i.e.: Meningitis, epidural or intraspinal abscess); bleeding (i.e.: epidural hematoma, subarachnoid hemorrhage, or any other type of intraspinal or peri-dural bleeding); organ or nerve damage (i.e.: Any type of peripheral nerve, nerve root, or spinal cord injury) with subsequent damage to sensory, motor, and/or autonomic systems, resulting in permanent pain, numbness, and/or weakness of one or several areas of the body; allergic reactions; (i.e.: anaphylactic reaction); and/or death. Furthermore, the patient was informed of those risks and complications associated with the medications. These include, but are not limited to: allergic reactions (i.e.: anaphylactic or anaphylactoid reaction(s)); adrenal axis suppression; blood sugar elevation that in diabetics may result in ketoacidosis or comma; water retention that in patients with history of congestive heart failure may result in shortness of breath, pulmonary edema, and decompensation with resultant heart failure; weight gain; swelling or edema; medication-induced neural toxicity; particulate matter embolism and blood vessel occlusion with resultant organ, and/or nervous system infarction; and/or aseptic necrosis of one or more joints. Finally, the patient was informed that Medicine is not an exact science; therefore, there is also the possibility of unforeseen or unpredictable risks and/or possible complications that may result in a catastrophic outcome. The patient indicated having understood very clearly. We have given the patient no guarantees and we have made no promises.  Enough time was given to the patient to ask questions, all of which were answered to the patient's satisfaction. Mr. Dubow has indicated that he wanted to continue with the procedure. Attestation: I, the ordering provider, attest that I have discussed with the patient the benefits, risks, side-effects, alternatives, likelihood of achieving goals, and potential problems during recovery for the procedure that I have provided informed consent. Date  Time: 04/24/2023  1:35 PM  Pre-Procedure Preparation:  Monitoring: As per clinic protocol. Respiration, ETCO2, SpO2, BP, heart rate and rhythm monitor placed and checked for adequate function Safety Precautions: Patient was assessed for positional comfort and pressure points before starting the procedure. Time-out: I initiated and conducted the "Time-out" before starting the procedure, as per protocol. The patient was asked to participate by confirming the accuracy of the "Time Out" information. Verification of the correct person, site, and procedure were performed and confirmed by me, the nursing staff, and the patient. "Time-out" conducted as per Joint Commission's Universal Protocol (UP.01.01.01). Time: 1430  Description/Narrative of Procedure:          Target: Epidural space via interlaminar opening, initially targeting the lower laminar border of the superior vertebral body. Region: Lumbar Approach: Percutaneous paravertebral  Rationale (medical necessity): procedure needed and proper for the diagnosis and/or treatment of the patient's medical symptoms and needs. Procedural Technique Safety Precautions: Aspiration looking for blood return was conducted prior to all injections. At no point did we inject any substances, as a needle was being advanced. No attempts were made at seeking any paresthesias. Safe injection practices and needle disposal techniques used. Medications properly checked for expiration dates. SDV (single dose vial) medications  used. Description of the Procedure: Protocol guidelines were followed. The procedure needle was introduced through the skin, ipsilateral to the reported pain, and advanced to the target area. Bone was contacted and the needle walked caudad, until the lamina was  cleared. The epidural space was identified using "loss-of-resistance technique" with 2-3 ml of PF-NaCl (0.9% NSS), in a 5cc LOR glass syringe.  6 cc solution made of 3 cc of preservative-free saline, 2 cc of 0.2% ropivacaine, 1 cc of Decadron 10 mg/cc.  Afterwards bilateral cervical trigger point injection was done with 1 cc of 0.2% ropivacaine injected into the left cervical trigger point.   Vitals:   04/24/23 1416 04/24/23 1421 04/24/23 1428 04/24/23 1431  BP: (!) 151/88 (!) 169/103 (!) 149/122 (!) 144/96  Pulse:      Resp: 18 18 17 18   Temp:      TempSrc:      SpO2: 98% 98% 100% 97%  Weight:      Height:        Start Time: 1430 hrs. End Time: 1433 hrs.  Imaging Guidance (Spinal):          Type of Imaging Technique: Fluoroscopy Guidance (Spinal) Indication(s): Assistance in needle guidance and placement for procedures requiring needle placement in or near specific anatomical locations not easily accessible without such assistance. Exposure Time: Please see nurses notes. Contrast: Before injecting any contrast, we confirmed that the patient did not have an allergy to iodine, shellfish, or radiological contrast. Once satisfactory needle placement was completed at the desired level, radiological contrast was injected. Contrast injected under live fluoroscopy. No contrast complications. See chart for type and volume of contrast used. Fluoroscopic Guidance: I was personally present during the use of fluoroscopy. "Tunnel Vision Technique" used to obtain the best possible view of the target area. Parallax error corrected before commencing the procedure. "Direction-depth-direction" technique used to introduce the needle under continuous  pulsed fluoroscopy. Once target was reached, antero-posterior, oblique, and lateral fluoroscopic projection used confirm needle placement in all planes. Images permanently stored in EMR. Interpretation: I personally interpreted the imaging intraoperatively. Adequate needle placement confirmed in multiple planes. Appropriate spread of contrast into desired area was observed. No evidence of afferent or efferent intravascular uptake. No intrathecal or subarachnoid spread observed. Permanent images saved into the patient's record.  Antibiotic Prophylaxis:   Anti-infectives (From admission, onward)    None      Indication(s): None identified  Post-operative Assessment:  Post-procedure Vital Signs:  Pulse/HCG Rate: 8176 Temp: (!) 97.2 F (36.2 C) Resp: 18 BP: (!) 144/96 SpO2: 97 %  EBL: None  Complications: No immediate post-treatment complications observed by team, or reported by patient.  Note: The patient tolerated the entire procedure well. A repeat set of vitals were taken after the procedure and the patient was kept under observation following institutional policy, for this type of procedure. Post-procedural neurological assessment was performed, showing return to baseline, prior to discharge. The patient was provided with post-procedure discharge instructions, including a section on how to identify potential problems. Should any problems arise concerning this procedure, the patient was given instructions to immediately contact us, at any time, without hesitation. In any case, we plan to contact the patient by telephone for a follow-up status report regarding this interventional procedure.  Comments:  No additional relevant information.  Plan of Care  Orders:  Orders Placed This Encounter  Procedures   DG PAIN CLINIC C-ARM 1-60 MIN NO REPORT    Intraoperative interpretation by procedural physician at [facility].    Standing Status:   Standing    Number of Occurrences:    1    Order Specific Question:   Reason for exam:    Answer:   Assistance in needle  guidance and placement for procedures requiring needle placement in or near specific anatomical locations not easily accessible without such assistance.     Medications ordered for procedure: Meds ordered this encounter  Medications   iohexol (OMNIPAQUE) 180 MG/ML injection 10 mL    Must be Myelogram-compatible. If not available, you may substitute with a water-soluble, non-ionic, hypoallergenic, myelogram-compatible radiological contrast medium.   lidocaine (XYLOCAINE) 2 % (with pres) injection 400 mg   sodium chloride flush (NS) 0.9 % injection 2 mL   ropivacaine (PF) 2 mg/mL (0.2%) (NAROPIN) injection 2 mL   dexamethasone (DECADRON) injection 10 mg   Medications administered: We administered iohexol, lidocaine, sodium chloride flush, ropivacaine (PF) 2 mg/mL (0.2%), and dexamethasone.  See the medical record for exact dosing, route, and time of administration.  Follow-up plan:   Return in about 4 weeks (around 05/22/2023) for PPE, F2F.      Recent Visits No visits were found meeting these conditions. Showing recent visits within past 90 days and meeting all other requirements Today's Visits Date Type Provider Dept  04/24/23 Procedure visit Edward Jolly, MD [facility]  Showing today's visits and meeting all other requirements Future Appointments No visits were found meeting these conditions. Showing future appointments within next 90 days and meeting all other requirements  Disposition: Discharge home  Discharge (Date  Time): 04/24/2023; 1445 hrs.   Primary Care Physician: [facility] Location[facility] Note by: Edward Jolly, MD Date: 04/24/2023; Time: 3:06 PM  Disclaimer:  Medicine is not an exact science. The only guarantee in medicine is that nothing is guaranteed. It is important to note that the decision to proceed with this  intervention was based on the information collected from the patient. The Data and conclusions were drawn from the patient's questionnaire, the interview, and the physical examination. Because the information was provided in large part by the patient, it cannot be guaranteed that it has not been purposely or unconsciously manipulated. Every effort has been made to obtain as much relevant data as possible for this evaluation. It is important to note that the conclusions that lead to this procedure are derived in large part from the available data. Always take into account that the treatment will also be dependent on availability of resources and existing treatment guidelines, considered by other Pain Management Practitioners as being common knowledge and practice, at the time of the intervention. For Medico-Legal purposes, it is also important to point out that variation in procedural techniques and pharmacological choices are the acceptable norm. The indications, contraindications, technique, and results of the above procedure should only be interpreted and judged by a Board-Certified Interventional Pain Specialist with extensive familiarity and expertise in the same exact procedure and technique.

## 2023-04-25 ENCOUNTER — Telehealth: Payer: Self-pay | Admitting: *Deleted

## 2023-04-25 NOTE — Telephone Encounter (Signed)
Attempted to call for post procedure follow-up. Message left. 

## 2023-05-29 ENCOUNTER — Ambulatory Visit
Payer: No Typology Code available for payment source | Admitting: Student in an Organized Health Care Education/Training Program

## 2023-06-06 ENCOUNTER — Ambulatory Visit
Payer: No Typology Code available for payment source | Attending: Student in an Organized Health Care Education/Training Program | Admitting: Student in an Organized Health Care Education/Training Program

## 2023-06-06 ENCOUNTER — Encounter: Payer: Self-pay | Admitting: Student in an Organized Health Care Education/Training Program

## 2023-06-06 VITALS — BP 124/69 | HR 79 | Temp 97.6°F | Resp 16 | Ht 71.0 in | Wt 238.0 lb

## 2023-06-06 DIAGNOSIS — M542 Cervicalgia: Secondary | ICD-10-CM | POA: Diagnosis present

## 2023-06-06 DIAGNOSIS — G894 Chronic pain syndrome: Secondary | ICD-10-CM | POA: Diagnosis not present

## 2023-06-06 DIAGNOSIS — M5410 Radiculopathy, site unspecified: Secondary | ICD-10-CM | POA: Diagnosis present

## 2023-06-06 DIAGNOSIS — M5412 Radiculopathy, cervical region: Secondary | ICD-10-CM | POA: Insufficient documentation

## 2023-06-06 DIAGNOSIS — M5416 Radiculopathy, lumbar region: Secondary | ICD-10-CM | POA: Diagnosis not present

## 2023-06-06 NOTE — Progress Notes (Signed)
PROVIDER NOTE: Information contained herein reflects review and annotations entered in association with encounter. Interpretation of such information and data should be left to medically-trained personnel. Information provided to patient can be located elsewhere in the medical record under "Patient Instructions". Document created using STT-dictation technology, any transcriptional errors that may result from process are unintentional.    Patient: Jordan Welch  Service Category: E/M  Provider: Edward Jolly, MD  DOB: [birth year]  DOS: 06/06/2023  Referring Provider: Harriet Masson, MD  MRN: [id number]  Specialty: Interventional Pain Management  PCP: [facility]  Type: Established Patient  Setting: Ambulatory outpatient    Location: Office  Delivery: Face-to-face     HPI  [name], a 62 y.o. year old male, is here today because of his Radicular pain [M54.10]. [name] primary complain today is Back Pain, Neck Pain, Shoulder Pain (left), and Hip Pain (bilat)  Pertinent problems: 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 Pain Assessment: Severity of Chronic pain is reported as a 1 /10. Location: Back Lower/denies. Onset: More than a month ago. Quality: Aching. Timing: Constant. Modifying factor(s): esi, rest. Vitals:  height is 5\' 11"  (1.803 m) and weight is 238 lb (108 kg). His temperature is 97.6 F (36.4 C). His blood pressure is 124/69 and his pulse is 79. His respiration is 16 and oxygen saturation is 100%.  BMI: Estimated body mass index is 33.19 kg/m as calculated from the following:   Height as of this encounter: 5\' 11"  (1.803 m).    Weight as of this encounter: 238 lb (108 kg). Last encounter: 01/09/2023. Last procedure: 04/24/2023.  Reason for encounter: post-procedure evaluation and assessment.   Post-procedure evaluation   Type: Lumbar epidural steroid injection (LESI) (interlaminar) #1   B/L Cervical TPI Laterality: Midline   Level:  L1-2 Level.  Imaging: Fluoroscopic guidance         Anesthesia: Local anesthesia (1-2% Lidocaine) DOS: 04/24/2023  Performed by: Edward Jolly, MD  Purpose: Diagnostic/Therapeutic Indications: Lumbar radicular pain of intraspinal etiology of more than 4 weeks that has failed to respond to conservative therapy and is severe enough to impact quality of life or function. 1. Radicular pain (lumbar)   2. Lumbar radicular pain   3. Cervicalgia   4. Chronic pain syndrome     NAS-11 Pain score:   Pre-procedure: 2 /10   Post-procedure: 3 /10      Effectiveness:  Initial hour after procedure: 100 %  Subsequent 4-6 hours post-procedure: 100 %  Analgesia past initial 6 hours: 90 %  Ongoing improvement:  Analgesic:  90% Function: [name] reports improvement in function ROM: [name] reports improvement in ROM    ROS  Constitutional: Denies any fever or chills Gastrointestinal: No reported hemesis, hematochezia, vomiting, or acute GI distress Musculoskeletal:  low back and bilateral hip pain, improved after L-ESI; having increased left neck and left arm pain Neurological: No reported episodes of acute onset apraxia, aphasia, dysarthria, agnosia, amnesia, paralysis, loss of coordination, or loss of consciousness  Medication Review  Alpha-Lipoic Acid, Empagliflozin, atorvastatin, losartan, metFORMIN, pantoprazole, psyllium, traZODone, and venlafaxine  History Review  Allergy: [name] has No Known Allergies. Drug: [name]  reports no history of  drug use. Alcohol:  reports current alcohol use. Tobacco:  reports that he has never smoked. He has never used smokeless  tobacco. Social: [name]  reports that he has never smoked. He has never used smokeless tobacco. He reports current alcohol use. He reports that he does not use drugs. Medical:  has a past medical history of Anxiety, High cholesterol, and Reflux. Surgical: [name]  has a past surgical history that includes Foot surgery (Right). Family: family history is not on file.  Laboratory Chemistry Profile   Renal No results found for: "BUN", "CREATININE", "LABCREA", "BCR", "GFR", "GFRAA", "GFRNONAA", "LABVMA", "EPIRU", "EPINEPH24HUR", "NOREPRU", "NOREPI24HUR", "DOPARU", "DOPAM24HRUR"  Hepatic No results found for: "AST", "ALT", "ALBUMIN", "ALKPHOS", "HCVAB", "AMYLASE", "LIPASE", "AMMONIA"  Electrolytes No results found for: "NA", "K", "CL", "CALCIUM", "MG", "PHOS"  Bone No results found for: "VD25OH", "VD125OH2TOT", "ZO1096EA5", "WU9811BJ4", "25OHVITD1", "25OHVITD2", "25OHVITD3", "TESTOFREE", "TESTOSTERONE"  Inflammation (CRP: Acute Phase) (ESR: Chronic Phase) No results found for: "CRP", "ESRSEDRATE", "LATICACIDVEN"       Note: Above Lab results reviewed.   Physical Exam  General appearance: Well nourished, well developed, and well hydrated. In no apparent acute distress Mental status: Alert, oriented x 3 (person, place, & time)       Respiratory: No evidence of acute respiratory distress Eyes: PERLA Vitals: BP 124/69   Pulse 79   Temp 97.6 F (36.4 C)   Resp 16   Ht 5\' 11"  (1.803 m)   Wt 238 lb (108 kg)   SpO2 100%   BMI 33.19 kg/m  BMI: Estimated body mass index is 33.19 kg/m as calculated from the following:   Height as of this encounter: 5\' 11"  (1.803 m).   Weight as of this encounter: 238 lb (108 kg). Ideal: Ideal body weight: 75.3 kg (166 lb 0.1 oz) Adjusted ideal body weight: 88.4 kg (194 lb 12.9 oz)  Left cervical spine pain with radiation into left arm  Assessment   Diagnosis Status  1. Radicular pain (lumbar)   2. Lumbar radicular pain   3. Cervicalgia   4.  Chronic pain syndrome   5. Cervical radicular pain    Controlled Controlled Controlled   Updated Problems: No problems updated.  Plan of Care  Increased left cervical radicular pain, previous cervical epidural steroid injection over 2 years ago which helped out Discussed repeating as below Orders:  Orders Placed This Encounter  Procedures   Cervical Epidural Injection    Sedation: Patient's choice. Purpose: Therapeutic Indication(s): Radiculitis and cervicalgia associater with cervical degenerative disc disease.    Standing Status:   Future    Standing Expiration Date:   09/06/2023    Scheduling Instructions:     Procedure: Cervical Epidural Steroid Injection/Block     Level(s): C7-T1     Laterality: LEFT     Timeframe: As soon as schedule allows    Order Specific Question:   Where will this procedure be performed?    Answer:   [facility]    Comments:   Yanique Mulvihill   Follow-up plan:   Return in about 2 weeks (around 06/20/2023) for Left C-ESI , in clinic [facility].      Recent Visits Date Type Provider Dept  04/24/23 Procedure visit Edward Jolly, MD [facility]  Showing recent visits within past 90 days and meeting all other requirements Today's Visits Date Type Provider Dept  06/06/23 Office Visit Edward Jolly, MD [facility]  Showing today's visits and meeting all other requirements Future Appointments Date Type Provider  Dept  06/19/23 Appointment Edward Jolly, MD [facility]  Showing future appointments within next 90 days and meeting all other requirements  I discussed the assessment and treatment plan with the patient. The patient was provided an opportunity to ask questions and all were answered. The patient agreed with the plan and demonstrated an understanding of the instructions.  Patient advised to call back or seek an in-person evaluation if the symptoms or condition worsens.  Duration of encounter: 30 minutes.  Total time  on encounter, as per AMA guidelines included both the face-to-face and non-face-to-face time personally spent by the physician and/or other qualified health care professional(s) on the day of the encounter (includes time in activities that require the physician or other qualified health care professional and does not include time in activities normally performed by clinical staff). Physician's time may include the following activities when performed: Preparing to see the patient (e.g., pre-charting review of records, searching for previously ordered imaging, lab work, and nerve conduction tests) Review of prior analgesic pharmacotherapies. Reviewing PMP Interpreting ordered tests (e.g., lab work, imaging, nerve conduction tests) Performing post-procedure evaluations, including interpretation of diagnostic procedures Obtaining and/or reviewing separately obtained history Performing a medically appropriate examination and/or evaluation Counseling and educating the patient/family/caregiver Ordering medications, tests, or procedures Referring and communicating with other health care professionals (when not separately reported) Documenting clinical information in the electronic or other health record Independently interpreting results (not separately reported) and communicating results to the patient/ family/caregiver Care coordination (not separately reported)  Note by: Edward Jolly, MD Date: 06/06/2023; Time: 1:47 PM

## 2023-06-06 NOTE — Progress Notes (Signed)
Safety precautions to be maintained throughout the outpatient stay will include: orient to surroundings, keep bed in low position, maintain call bell within reach at all times, provide assistance with transfer out of bed and ambulation.  

## 2023-06-06 NOTE — Patient Instructions (Signed)
______________________________________________________________________    General Risks and Possible Complications  Patient Responsibilities: It is important that you read this as it is part of your informed consent. It is our duty to inform you of the risks and possible complications associated with treatments offered to you. It is your responsibility as a patient to read this and to ask questions about anything that is not clear or that you believe was not covered in this document.  Patient's Rights: You have the right to refuse treatment. You also have the right to change your mind, even after initially having agreed to have the treatment done. However, under this last option, if you wait until the last second to change your mind, you may be charged for the materials used up to that point.  Introduction: Medicine is not an Visual merchandiser. Everything in Medicine, including the lack of treatment(s), carries the potential for danger, harm, or loss (which is by definition: Risk). In Medicine, a complication is a secondary problem, condition, or disease that can aggravate an already existing one. All treatments carry the risk of possible complications. The fact that a side effects or complications occurs, does not imply that the treatment was conducted incorrectly. It must be clearly understood that these can happen even when everything is done following the highest safety standards.  No treatment: You can choose not to proceed with the proposed treatment alternative. The "PRO(s)" would include: avoiding the risk of complications associated with the therapy. The "CON(s)" would include: not getting any of the treatment benefits. These benefits fall under one of three categories: diagnostic; therapeutic; and/or palliative. Diagnostic benefits include: getting information which can ultimately lead to improvement of the disease or symptom(s). Therapeutic benefits are those associated with the successful  treatment of the disease. Finally, palliative benefits are those related to the decrease of the primary symptoms, without necessarily curing the condition (example: decreasing the pain from a flare-up of a chronic condition, such as incurable terminal cancer).  General Risks and Complications: These are associated to most interventional treatments. They can occur alone, or in combination. They fall under one of the following six (6) categories: no benefit or worsening of symptoms; bleeding; infection; nerve damage; allergic reactions; and/or death. No benefits or worsening of symptoms: In Medicine there are no guarantees, only probabilities. No healthcare provider can ever guarantee that a medical treatment will work, they can only state the probability that it may. Furthermore, there is always the possibility that the condition may worsen, either directly, or indirectly, as a consequence of the treatment. Bleeding: This is more common if the patient is taking a blood thinner, either prescription or over the counter (example: Goody Powders, Fish oil, Aspirin, Garlic, etc.), or if suffering a condition associated with impaired coagulation (example: Hemophilia, cirrhosis of the liver, low platelet counts, etc.). However, even if you do not have one on these, it can still happen. If you have any of these conditions, or take one of these drugs, make sure to notify your treating physician. Infection: This is more common in patients with a compromised immune system, either due to disease (example: diabetes, cancer, human immunodeficiency virus [HIV], etc.), or due to medications or treatments (example: therapies used to treat cancer and rheumatological diseases). However, even if you do not have one on these, it can still happen. If you have any of these conditions, or take one of these drugs, make sure to notify your treating physician. Nerve Damage: This is more common when the treatment is  an invasive one, but it  can also happen with the use of medications, such as those used in the treatment of cancer. The damage can occur to small secondary nerves, or to large primary ones, such as those in the spinal cord and brain. This damage may be temporary or permanent and it may lead to impairments that can range from temporary numbness to permanent paralysis and/or brain death. Allergic Reactions: Any time a substance or material comes in contact with our body, there is the possibility of an allergic reaction. These can range from a mild skin rash (contact dermatitis) to a severe systemic reaction (anaphylactic reaction), which can result in death. Death: In general, any medical intervention can result in death, most of the time due to an unforeseen complication. ______________________________________________________________________      ______________________________________________________________________    Preparing for your procedure  Appointments: If you think you may not be able to keep your appointment, call 24-48 hours in advance to cancel. We need time to make it available to others.  During your procedure appointment there will be: No Prescription Refills. No disability issues to discussed. No medication changes or discussions.  Instructions: Food intake: Avoid eating anything solid for at least 8 hours prior to your procedure. Clear liquid intake: You may take clear liquids such as water up to 2 hours prior to your procedure. (No carbonated drinks. No soda.) Transportation: Unless otherwise stated by your physician, bring a driver. (Driver cannot be a Market researcher, Pharmacist, community, or any other form of public transportation.) Morning Medicines: Except for blood thinners, take all of your other morning medications with a sip of water. Make sure to take your heart and blood pressure medicines. If your blood pressure's lower number is above 100, the case will be rescheduled. Blood thinners: Make sure to stop your blood  thinners as instructed.  If you take a blood thinner, but were not instructed to stop it, call our office [phone] and ask to talk to a nurse. Not stopping a blood thinner prior to certain procedures could lead to serious complications. Diabetics on insulin: Notify the staff so that you can be scheduled 1st case in the morning. If your diabetes requires high dose insulin, take only  of your normal insulin dose the morning of the procedure and notify the staff that you have done so. Preventing infections: Shower with an antibacterial soap the morning of your procedure.  Build-up your immune system: Take 1000 mg of Vitamin C with every meal (3 times a day) the day prior to your procedure. Antibiotics: Inform the nursing staff if you are taking any antibiotics or if you have any conditions that may require antibiotics prior to procedures. (Example: recent joint implants)   Pregnancy: If you are pregnant make sure to notify the nursing staff. Not doing so may result in injury to the fetus, including death.  Sickness: If you have a cold, fever, or any active infections, call and cancel or reschedule your procedure. Receiving steroids while having an infection may result in complications. Arrival: You must be in the facility at least 30 minutes prior to your scheduled procedure. Tardiness: Your scheduled time is also the cutoff time. If you do not arrive at least 15 minutes prior to your procedure, you will be rescheduled.  Children: Do not bring any children with you. Make arrangements to keep them home. Dress appropriately: There is always a possibility that your clothing may get soiled. Avoid long dresses. Valuables: Do not bring any jewelry  or valuables.  Reasons to call and reschedule or cancel your procedure: (Following these recommendations will minimize the risk of a serious complication.) Surgeries: Avoid having procedures within 2 weeks of any surgery. (Avoid for 2 weeks before or after any  surgery). Flu Shots: Avoid having procedures within 2 weeks of a flu shots or . (Avoid for 2 weeks before or after immunizations). Barium: Avoid having a procedure within 7-10 days after having had a radiological study involving the use of radiological contrast. (Myelograms, Barium swallow or enema study). Heart attacks: Avoid any elective procedures or surgeries for the initial 6 months after a "Myocardial Infarction" (Heart Attack). Blood thinners: It is imperative that you stop these medications before procedures. Let us know if you if you take any blood thinner.  Infection: Avoid procedures during or within two weeks of an infection (including chest colds or gastrointestinal problems). Symptoms associated with infections include: Localized redness, fever, chills, night sweats or profuse sweating, burning sensation when voiding, cough, congestion, stuffiness, runny nose, sore throat, diarrhea, nausea, vomiting, cold or Flu symptoms, recent or current infections. It is specially important if the infection is over the area that we intend to treat. Heart and lung problems: Symptoms that may suggest an active cardiopulmonary problem include: cough, chest pain, breathing difficulties or shortness of breath, dizziness, ankle swelling, uncontrolled high or unusually low blood pressure, and/or palpitations. If you are experiencing any of these symptoms, cancel your procedure and contact your primary care physician for an evaluation.  Remember:  Regular Business hours are:  Monday to Thursday 8:00 AM to 4:00 PM  Provider's Schedule: Delano Metz, MD:  Procedure days: Tuesday and Thursday 7:30 AM to 4:00 PM  Edward Jolly, MD:  Procedure days: Monday and Wednesday 7:30 AM to 4:00 PM Last  Updated: 03/12/2023 ______________________________________________________________________     Epidural Steroid Injection Patient Information  Description: The epidural space surrounds the nerves as they exit  the spinal cord.  In some patients, the nerves can be compressed and inflamed by a bulging disc or a tight spinal canal (spinal stenosis).  By injecting steroids into the epidural space, we can bring irritated nerves into direct contact with a potentially helpful medication.  These steroids act directly on the irritated nerves and can reduce swelling and inflammation which often leads to decreased pain.  Epidural steroids may be injected anywhere along the spine and from the neck to the low back depending upon the location of your pain.   After numbing the skin with local anesthetic (like Novocaine), a small needle is passed into the epidural space slowly.  You may experience a sensation of pressure while this is being done.  The entire block usually last less than 10 minutes.  Conditions which may be treated by epidural steroids:  Low back and leg pain Neck and arm pain Spinal stenosis Post-laminectomy syndrome Herpes zoster (shingles) pain Pain from compression fractures  Preparation for the injection:  Do not eat any solid food or dairy products within 8 hours of your appointment.  You may drink clear liquids up to 3 hours before appointment.  Clear liquids include water, black coffee, juice or soda.  No milk or cream please. You may take your regular medication, including pain medications, with a sip of water before your appointment  Diabetics should hold regular insulin (if taken separately) and take 1/2 normal NPH dos the morning of the procedure.  Carry some sugar containing items with you to your appointment. A driver must accompany you  and be prepared to drive you home after your procedure.  Bring all your current medications with your. An IV may be inserted and sedation may be given at the discretion of the physician.   A blood pressure cuff, EKG and other monitors will often be applied during the procedure.  Some patients may need to have extra oxygen administered for a short  period. You will be asked to provide medical information, including your allergies, prior to the procedure.  We must know immediately if you are taking blood thinners (like Coumadin/Warfarin)  Or if you are allergic to IV iodine contrast (dye). We must know if you could possible be pregnant.  Possible side-effects: Bleeding from needle site Infection (rare, may require surgery) Nerve injury (rare) Numbness & tingling (temporary) Difficulty urinating (rare, temporary) Spinal headache ( a headache worse with upright posture) Light -headedness (temporary) Pain at injection site (several days) Decreased blood pressure (temporary) Weakness in arm/leg (temporary) Pressure sensation in back/neck (temporary)  Call if you experience: Fever/chills associated with headache or increased back/neck pain. Headache worsened by an upright position. New onset weakness or numbness of an extremity below the injection site Hives or difficulty breathing (go to the emergency room) Inflammation or drainage at the infection site Severe back/neck pain Any new symptoms which are concerning to you  Please note:  Although the local anesthetic injected can often make your back or neck feel good for several hours after the injection, the pain will likely return.  It takes 3-7 days for steroids to work in the epidural space.  You may not notice any pain relief for at least that one week.  If effective, we will often do a series of three injections spaced 3-6 weeks apart to maximally decrease your pain.  After the initial series, we generally will wait several months before considering a repeat injection of the same type.  If you have any questions, please call [phone] [facility]

## 2023-06-19 ENCOUNTER — Encounter: Payer: Self-pay | Admitting: Student in an Organized Health Care Education/Training Program

## 2023-06-19 ENCOUNTER — Ambulatory Visit
Admission: RE | Admit: 2023-06-19 | Discharge: 2023-06-19 | Disposition: A | Discharge status: Home / Self Care | Payer: No Typology Code available for payment source | Source: Ambulatory Visit | Attending: Student in an Organized Health Care Education/Training Program | Admitting: Student in an Organized Health Care Education/Training Program

## 2023-06-19 ENCOUNTER — Ambulatory Visit
Payer: No Typology Code available for payment source | Attending: Student in an Organized Health Care Education/Training Program | Admitting: Student in an Organized Health Care Education/Training Program

## 2023-06-19 VITALS — BP 157/91 | HR 98 | Temp 96.8°F | Resp 18 | Ht 71.0 in | Wt 238.0 lb

## 2023-06-19 DIAGNOSIS — M5030 Other cervical disc degeneration, unspecified cervical region: Secondary | ICD-10-CM | POA: Diagnosis not present

## 2023-06-19 DIAGNOSIS — M5412 Radiculopathy, cervical region: Secondary | ICD-10-CM | POA: Insufficient documentation

## 2023-06-19 DIAGNOSIS — G894 Chronic pain syndrome: Secondary | ICD-10-CM | POA: Diagnosis not present

## 2023-06-19 MED ORDER — IOHEXOL 180 MG/ML  SOLN
10.0000 mL | Freq: Once | INTRAMUSCULAR | Status: AC
  Administered 2023-06-19: 10 mL via EPIDURAL
  Filled 2023-06-19: qty 20

## 2023-06-19 MED ORDER — LIDOCAINE HCL 2 % IJ SOLN
20.0000 mL | Freq: Once | INTRAMUSCULAR | Status: AC
  Administered 2023-06-19: 100 mg
  Filled 2023-06-19: qty 40

## 2023-06-19 MED ORDER — SODIUM CHLORIDE 0.9% FLUSH
1.0000 mL | Freq: Once | INTRAVENOUS | Status: AC
  Administered 2023-06-19: 1 mL

## 2023-06-19 MED ORDER — ROPIVACAINE HCL 2 MG/ML IJ SOLN
1.0000 mL | Freq: Once | INTRAMUSCULAR | Status: AC
  Administered 2023-06-19: 1 mL via EPIDURAL
  Filled 2023-06-19: qty 20

## 2023-06-19 MED ORDER — DEXAMETHASONE SODIUM PHOSPHATE 10 MG/ML IJ SOLN
10.0000 mg | Freq: Once | INTRAMUSCULAR | Status: AC
  Administered 2023-06-19: 10 mg
  Filled 2023-06-19: qty 1

## 2023-06-19 NOTE — Progress Notes (Signed)
Safety precautions to be maintained throughout the outpatient stay will include: orient to surroundings, keep bed in low position, maintain call bell within reach at all times, provide assistance with transfer out of bed and ambulation.  

## 2023-06-19 NOTE — Progress Notes (Signed)
PROVIDER NOTE: Interpretation of information contained herein should be left to medically-trained personnel. Specific patient instructions are provided elsewhere under "Patient Instructions" section of medical record. This document was created in part using STT-dictation technology, any transcriptional errors that may result from this process are unintentional.  Patient: Jordan Welch Type: Established DOB: January 18, 1961 MRN: [id number] [facility]: [facility]  Service: Procedure DOS: 06/19/2023 Setting: Ambulatory Location: Ambulatory outpatient facility Delivery: Face-to-face Provider: Edward Jolly, MD Specialty: Interventional Pain Management Specialty designation: 09 Location: Outpatient facility Ref. Prov.: [facility]       Interventional Therapy   Procedure: Cervical Epidural Steroid injection (CESI) (Interlaminar) #1  Laterality: Left  Level: C7-T1 Imaging: Fluoroscopy-assisted DOS: 06/19/2023  Performed by: Edward Jolly, MD Anesthesia: Local anesthesia (1-2% Lidocaine) Sedation: No Sedation                         Purpose: Diagnostic/Therapeutic Indications: Cervicalgia, cervical radicular pain, degenerative disc disease, severe enough to impact quality of life or function. 1. Cervical radicular pain   2. Chronic pain syndrome    NAS-11 score:   Pre-procedure: 1 /10   Post-procedure: 1 /10      Position  Prep  Materials:  Location setting: Procedure suite Position: Prone, on modified reverse trendelenburg to facilitate breathing, with head in head-cradle. Pillows positioned under chest (below chin-level) with cervical spine flexed. Safety Precautions: Patient was assessed for positional comfort and pressure points before starting the procedure. Prepping solution: DuraPrep (Iodine Povacrylex [0.7% available iodine] and Isopropyl Alcohol, 74% w/w) Prep Area: Entire  cervicothoracic region Approach: percutaneous, paramedial Intended target: Posterior cervical epidural  space Materials Procedure:  Tray: Epidural Needle(s): Epidural (Tuohy) Qty: 1 Length: (90mm) 3.5-inch Gauge: 22G   H&P (Pre-op Assessment):  Mr. Renew is a 62 y.o. (year old), male patient, seen today for interventional treatment. He  has a past surgical history that includes Foot surgery (Right). Mr. Vogan has a current medication list which includes the following prescription(s): alpha-lipoic acid, atorvastatin, empagliflozin, losartan, metformin, pantoprazole, psyllium, trazodone, and venlafaxine. His primarily concern today is the Neck Pain  Initial Vital Signs:  Pulse/HCG Rate: 98ECG Heart Rate: 91 Temp: (!) 96.8 F (36 C) Resp: 16 BP: (!) 153/74 SpO2: 100 %  BMI: Estimated body mass index is 33.19 kg/m as calculated from the following:   Height as of this encounter: 5\' 11"  (1.803 m).   Weight as of this encounter: 238 lb (108 kg).  Risk Assessment: Allergies: Reviewed. He has No Known Allergies.  Allergy Precautions: None required Coagulopathies: Reviewed. None identified.  Blood-thinner therapy: None at this time Active Infection(s): Reviewed. None identified. Mr. Ferrales is afebrile  Site Confirmation: Mr. Amedee was asked to confirm the procedure and laterality before marking the site Procedure checklist: Completed Consent: Before the procedure and under the influence of no sedative(s), amnesic(s), or anxiolytics, the patient was informed of the treatment options, risks and possible complications. To fulfill our ethical and legal obligations, as recommended by the American Medical Association's Code of Ethics, I have informed the patient of my clinical impression; the nature and purpose of the treatment or procedure; the risks, benefits, and possible complications of the intervention; the alternatives, including doing nothing; the risk(s) and benefit(s) of the alternative treatment(s) or procedure(s); and the risk(s) and benefit(s) of doing nothing. The patient was provided  information about the general risks and possible complications associated with the procedure. These may include, but are not limited to: failure to achieve  desired goals, infection, bleeding, organ or nerve damage, allergic reactions, paralysis, and death. In addition, the patient was informed of those risks and complications associated to Spine-related procedures, such as failure to decrease pain; infection (i.e.: Meningitis, epidural or intraspinal abscess); bleeding (i.e.: epidural hematoma, subarachnoid hemorrhage, or any other type of intraspinal or peri-dural bleeding); organ or nerve damage (i.e.: Any type of peripheral nerve, nerve root, or spinal cord injury) with subsequent damage to sensory, motor, and/or autonomic systems, resulting in permanent pain, numbness, and/or weakness of one or several areas of the body; allergic reactions; (i.e.: anaphylactic reaction); and/or death. Furthermore, the patient was informed of those risks and complications associated with the medications. These include, but are not limited to: allergic reactions (i.e.: anaphylactic or anaphylactoid reaction(s)); adrenal axis suppression; blood sugar elevation that in diabetics may result in ketoacidosis or comma; water retention that in patients with history of congestive heart failure may result in shortness of breath, pulmonary edema, and decompensation with resultant heart failure; weight gain; swelling or edema; medication-induced neural toxicity; particulate matter embolism and blood vessel occlusion with resultant organ, and/or nervous system infarction; and/or aseptic necrosis of one or more joints. Finally, the patient was informed that Medicine is not an exact science; therefore, there is also the possibility of unforeseen or unpredictable risks and/or possible complications that may result in a catastrophic outcome. The patient indicated having understood very clearly. We have given the patient no guarantees and we  have made no promises. Enough time was given to the patient to ask questions, all of which were answered to the patient's satisfaction. Mr. Denigris has indicated that he wanted to continue with the procedure. Attestation: I, the ordering provider, attest that I have discussed with the patient the benefits, risks, side-effects, alternatives, likelihood of achieving goals, and potential problems during recovery for the procedure that I have provided informed consent. Date  Time: 06/19/2023  1:40 PM   Pre-Procedure Preparation:  Monitoring: As per clinic protocol. Respiration, ETCO2, SpO2, BP, heart rate and rhythm monitor placed and checked for adequate function Safety Precautions: Patient was assessed for positional comfort and pressure points before starting the procedure. Time-out: I initiated and conducted the "Time-out" before starting the procedure, as per protocol. The patient was asked to participate by confirming the accuracy of the "Time Out" information. Verification of the correct person, site, and procedure were performed and confirmed by me, the nursing staff, and the patient. "Time-out" conducted as per Joint Commission's Universal Protocol (UP.01.01.01). Time: 1437 Start Time: 1437 hrs.  Description  Narrative of Procedure:          Rationale (medical necessity): procedure needed and proper for the diagnosis and/or treatment of the patient's medical symptoms and needs. Start Time: 1437 hrs. Safety Precautions: Aspiration looking for blood return was conducted prior to all injections. At no point did we inject any substances, as a needle was being advanced. No attempts were made at seeking any paresthesias. Safe injection practices and needle disposal techniques used. Medications properly checked for expiration dates. SDV (single dose vial) medications used. Description of procedure: Protocol guidelines were followed. The patient was assisted into a comfortable position. The target area  was identified and the area prepped in the usual manner. Skin & deeper tissues infiltrated with local anesthetic. Appropriate amount of time allowed to pass for local anesthetics to take effect. Using fluoroscopic guidance, the epidural needle was introduced through the skin, ipsilateral to the reported pain, and advanced to the target area. Posterior laminar  os was contacted and the needle walked caudad, until the lamina was cleared. The ligamentum flavum was engaged and the epidural space identified using "loss-of-resistance technique" with 2-3 ml of PF-NaCl (0.9% NSS), in a 5cc dedicated LOR syringe. (See "Imaging guidance" below for use of contrast details.) Once proper needle placement was secured, and negative aspiration confirmed, the solution was injected in intermittent fashion, asking for systemic symptoms every 0.5cc. The needles were then removed and the area cleansed, making sure to leave some of the prepping solution back to take advantage of its long term bactericidal properties.  Vitals:   06/19/23 1411 06/19/23 1437  BP: (!) 153/74 (!) 157/91  Pulse: 98   Resp: 16 18  Temp: (!) 96.8 F (36 C)   TempSrc: Temporal   SpO2: 100% 100%  Weight: 238 lb (108 kg)   Height: 5\' 11"  (1.803 m)      End Time: 1440 hrs.  Imaging Guidance (Spinal):          Type of Imaging Technique: Fluoroscopy Guidance (Spinal) Indication(s): Fluoroscopy guidance for needle placement to enhance accuracy in procedures requiring precise needle localization for targeted delivery of medication in or near specific anatomical locations not easily accessible without such real-time imaging assistance. Exposure Time: Please see nurses notes. Contrast: Before injecting any contrast, we confirmed that the patient did not have an allergy to iodine, shellfish, or radiological contrast. Once satisfactory needle placement was completed at the desired level, radiological contrast was injected. Contrast injected under live  fluoroscopy. No contrast complications. See chart for type and volume of contrast used. Fluoroscopic Guidance: I was personally present during the use of fluoroscopy. "Tunnel Vision Technique" used to obtain the best possible view of the target area. Parallax error corrected before commencing the procedure. "Direction-depth-direction" technique used to introduce the needle under continuous pulsed fluoroscopy. Once target was reached, antero-posterior, oblique, and lateral fluoroscopic projection used confirm needle placement in all planes. Images permanently stored in EMR. Interpretation: I personally interpreted the imaging intraoperatively. Adequate needle placement confirmed in multiple planes. Appropriate spread of contrast into desired area was observed. No evidence of afferent or efferent intravascular uptake. No intrathecal or subarachnoid spread observed. Permanent images saved into the patient's record.  Post-operative Assessment:  Post-procedure Vital Signs:  Pulse/HCG Rate: 9891 Temp: (!) 96.8 F (36 C) Resp: 18 BP: (!) 157/91 SpO2: 100 %  EBL: None  Complications: No immediate post-treatment complications observed by team, or reported by patient.  Note: The patient tolerated the entire procedure well. A repeat set of vitals were taken after the procedure and the patient was kept under observation following institutional policy, for this type of procedure. Post-procedural neurological assessment was performed, showing return to baseline, prior to discharge. The patient was provided with post-procedure discharge instructions, including a section on how to identify potential problems. Should any problems arise concerning this procedure, the patient was given instructions to immediately contact us, at any time, without hesitation. In any case, we plan to contact the patient by telephone for a follow-up status report regarding this interventional procedure.  Comments:  No additional  relevant information.  Plan of Care (POC)  Orders:  Orders Placed This Encounter  Procedures   DG PAIN CLINIC C-ARM 1-60 MIN NO REPORT    Intraoperative interpretation by procedural physician at [facility].    Standing Status:   Standing    Number of Occurrences:   1    Order Specific Question:   Reason for exam:  Answer:   Assistance in needle guidance and placement for procedures requiring needle placement in or near specific anatomical locations not easily accessible without such assistance.    Medications ordered for procedure: Meds ordered this encounter  Medications   iohexol (OMNIPAQUE) 180 MG/ML injection 10 mL    Must be Myelogram-compatible. If not available, you may substitute with a water-soluble, non-ionic, hypoallergenic, myelogram-compatible radiological contrast medium.   lidocaine (XYLOCAINE) 2 % (with pres) injection 400 mg   sodium chloride flush (NS) 0.9 % injection 1 mL   ropivacaine (PF) 2 mg/mL (0.2%) (NAROPIN) injection 1 mL   dexamethasone (DECADRON) injection 10 mg   Medications administered: We administered iohexol, lidocaine, sodium chloride flush, ropivacaine (PF) 2 mg/mL (0.2%), and dexamethasone.  See the medical record for exact dosing, route, and time of administration.  Follow-up plan:   Return in about 7 weeks (around 08/07/2023) for PPE, F2F.      Recent Visits Date Type Provider Dept  06/06/23 Office Visit Edward Jolly, MD [facility]  04/24/23 Procedure visit Edward Jolly, MD [facility]  Showing recent visits within past 90 days and meeting all other requirements Today's Visits Date Type Provider Dept  06/19/23 Procedure visit Edward Jolly, MD [facility]  Showing today's visits and meeting all other requirements Future Appointments Date Type Provider Dept  08/07/23 Appointment Edward Jolly, MD [facility]  Showing future appointments within next 90 days and meeting all other  requirements  Disposition: Discharge home  Discharge (Date  Time): 06/19/2023; 1445 hrs.   Primary [facility] Physician: [facility] Location[facility] Note by: Edward Jolly, MD (TTS technology used. I apologize for any typographical errors that were not detected and corrected.) Date: 06/19/2023; Time: 3:52 PM  Disclaimer:  Medicine is not an Visual merchandiser. The only guarantee in medicine is that nothing is guaranteed. It is important to note that the decision to proceed with this intervention was based on the information collected from the patient. The Data and conclusions were drawn from the patient's questionnaire, the interview, and the physical examination. Because the information was provided in large part by the patient, it cannot be guaranteed that it has not been purposely or unconsciously manipulated. Every effort has been made to obtain as much relevant data as possible for this evaluation. It is important to note that the conclusions that lead to this procedure are derived in large part from the available data. Always take into account that the treatment will also be dependent on availability of resources and existing treatment guidelines, considered by other Pain Management Practitioners as being common knowledge and practice, at the time of the intervention. For Medico-Legal purposes, it is also important to point out that variation in procedural techniques and pharmacological choices are the acceptable norm. The indications, contraindications, technique, and results of the above procedure should only be interpreted and judged by a Board-Certified Interventional Pain Specialist with extensive familiarity and expertise in the same exact procedure and technique.

## 2023-08-07 ENCOUNTER — Encounter: Payer: Self-pay | Admitting: Student in an Organized Health Care Education/Training Program

## 2023-08-07 ENCOUNTER — Ambulatory Visit
Payer: No Typology Code available for payment source | Attending: Student in an Organized Health Care Education/Training Program | Admitting: Student in an Organized Health Care Education/Training Program

## 2023-08-07 VITALS — BP 138/75 | HR 82 | Temp 98.1°F | Resp 16 | Ht 71.0 in | Wt 240.0 lb

## 2023-08-07 DIAGNOSIS — M25552 Pain in left hip: Secondary | ICD-10-CM | POA: Insufficient documentation

## 2023-08-07 DIAGNOSIS — M5412 Radiculopathy, cervical region: Secondary | ICD-10-CM | POA: Insufficient documentation

## 2023-08-07 DIAGNOSIS — Z9889 Other specified postprocedural states: Secondary | ICD-10-CM | POA: Diagnosis not present

## 2023-08-07 DIAGNOSIS — M542 Cervicalgia: Secondary | ICD-10-CM | POA: Insufficient documentation

## 2023-08-07 DIAGNOSIS — M533 Sacrococcygeal disorders, not elsewhere classified: Secondary | ICD-10-CM | POA: Diagnosis not present

## 2023-08-07 DIAGNOSIS — G894 Chronic pain syndrome: Secondary | ICD-10-CM | POA: Insufficient documentation

## 2023-08-07 DIAGNOSIS — M47816 Spondylosis without myelopathy or radiculopathy, lumbar region: Secondary | ICD-10-CM | POA: Insufficient documentation

## 2023-08-07 DIAGNOSIS — M7918 Myalgia, other site: Secondary | ICD-10-CM | POA: Insufficient documentation

## 2023-08-07 DIAGNOSIS — M25551 Pain in right hip: Secondary | ICD-10-CM | POA: Diagnosis not present

## 2023-08-07 DIAGNOSIS — M51369 Other intervertebral disc degeneration, lumbar region without mention of lumbar back pain or lower extremity pain: Secondary | ICD-10-CM | POA: Insufficient documentation

## 2023-08-07 DIAGNOSIS — M4802 Spinal stenosis, cervical region: Secondary | ICD-10-CM | POA: Insufficient documentation

## 2023-08-07 NOTE — Progress Notes (Signed)
 PROVIDER NOTE: Information contained herein reflects review and annotations entered in association with encounter. Interpretation of such information and data should be left to medically-trained personnel. Information provided to patient can be located elsewhere in the medical record under "Patient Instructions". Document created using STT-dictation technology, any transcriptional errors that may result from process are unintentional.    Patient: Jordan Welch  Service Category: E/M  Provider: Cephus Collin, MD  DOB: 01-Jul-1961  DOS: 08/07/2023  Referring Provider: [facility]  MRN: [id number]  Specialty: Interventional Pain Management  PCP: [facility]  Type: Established Patient  Setting: Ambulatory outpatient    Location: Office  Delivery: Face-to-face     HPI  Mr. Brint Rebich, a 63 y.o. year old male, is here today because of his Cervical radicular pain [M54.12]. Mr. Nazari primary complain today is Neck Pain  Pertinent problems: Mr. 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 Pain Assessment: Severity of Chronic pain is reported as a 0-No pain/10. Location: Neck  /tingling in left hand. Onset: 1 to 4 weeks ago. Quality: Aching, Pressure. Timing: Intermittent. Modifying factor(s): Ibuprofen. Vitals:  height is 5\' 11"  (1.803 m) and weight is 240 lb (108.9 kg). His temporal temperature is 98.1 F (36.7 C). His blood pressure is 138/75 and his pulse is 82. His respiration is 16 and oxygen saturation is 99%.  BMI: Estimated body mass index is 33.47 kg/m as calculated from the following:   Height as of this encounter: 5\' 11"  (1.803 m).   Weight as  of this encounter: 240 lb (108.9 kg). Last encounter: 06/06/2023. Last procedure: 06/19/2023.  Reason for encounter: post-procedure evaluation and assessment.    Post-procedure evaluation    Procedure: Cervical Epidural Steroid injection (CESI) (Interlaminar) #1  Laterality: Left  Level: C7-T1 Imaging: Fluoroscopy-assisted DOS: 06/19/2023  Performed by: Cephus Collin, MD Anesthesia: Local anesthesia (1-2% Lidocaine ) Sedation: No Sedation                         Purpose: Diagnostic/Therapeutic Indications: Cervicalgia, cervical radicular pain, degenerative disc disease, severe enough to impact quality of life or function. 1. Cervical radicular pain   2. Chronic pain syndrome    NAS-11 score:   Pre-procedure: 1 /10   Post-procedure: 1 /10     Effectiveness:  Initial hour after procedure: 100 %  Subsequent 4-6 hours post-procedure: 100 %  Analgesia past initial 6 hours: 90 %  Ongoing improvement:  Analgesic:  75% Function: Mr. Prall reports improvement in function ROM: Mr. Honor reports improvement in ROM     ROS  Constitutional: Denies any fever or chills Gastrointestinal: No reported hemesis, hematochezia, vomiting, or acute GI distress Musculoskeletal: Denies any acute onset joint swelling, redness, loss of ROM, or weakness Neurological:  Left hand numbness and tingling  Medication Review  Alpha-Lipoic Acid, Empagliflozin, atorvastatin, budesonide, ibuprofen, ipratropium, losartan, metFORMIN, pantoprazole, psyllium, tamsulosin, traZODone, and venlafaxine  History Review  Allergy: Mr. Samaniego has no known allergies. Drug: Mr. Tango  reports no history of drug use. Alcohol:  reports current alcohol use. Tobacco:  reports that he has never smoked. He has never used smokeless tobacco. Social: Mr. Amesquita  reports that he has never smoked. He  has never used smokeless tobacco. He reports current alcohol use. He reports that he does not use drugs. Medical:  has a past medical  history of Anxiety, High cholesterol, and Reflux. Surgical: Mr. Challis  has a past surgical history that includes Foot surgery (Right). Family: family history is not on file.  Laboratory Chemistry Profile   Renal No results found for: "BUN", "CREATININE", "LABCREA", "BCR", "GFR", "GFRAA", "GFRNONAA", "LABVMA", "EPIRU", "EPINEPH24HUR", "NOREPRU", "NOREPI24HUR", "DOPARU", "DOPAM24HRUR"  Hepatic No results found for: "AST", "ALT", "ALBUMIN", "ALKPHOS", "HCVAB", "AMYLASE", "LIPASE", "AMMONIA"  Electrolytes No results found for: "NA", "K", "CL", "CALCIUM", "MG", "PHOS"  Bone No results found for: "VD25OH", "VD125OH2TOT", "[id number]", "NW2956OZ3", "25OHVITD1", "25OHVITD2", "25OHVITD3", "TESTOFREE", "TESTOSTERONE"  Inflammation (CRP: Acute Phase) (ESR: Chronic Phase) No results found for: "CRP", "ESRSEDRATE", "LATICACIDVEN"       Note: Above Lab results reviewed.  Recent Imaging Review  DG PAIN CLINIC C-ARM 1-60 MIN NO REPORT Fluoro was used, but no Radiologist interpretation will be provided.  Please refer to "NOTES" tab for provider progress note. Note: Reviewed        Physical Exam  General appearance: Well nourished, well developed, and well hydrated. In no apparent acute distress Mental status: Alert, oriented x 3 (person, place, & time)       Respiratory: No evidence of acute respiratory distress Eyes: PERLA Vitals: BP 138/75   Pulse 82   Temp 98.1 F (36.7 C) (Temporal)   Resp 16   Ht 5\' 11"  (1.803 m)   Wt 240 lb (108.9 kg)   SpO2 99%   BMI 33.47 kg/m  BMI: Estimated body mass index is 33.47 kg/m as calculated from the following:   Height as of this encounter: 5\' 11"  (1.803 m).   Weight as of this encounter: 240 lb (108.9 kg). Ideal: Ideal body weight: 75.3 kg (166 lb 0.1 oz) Adjusted ideal body weight: 88.7 kg (195 lb 9.7 oz)  Assessment   Diagnosis Status  1. Cervical radicular pain   2. Cervicalgia   3. Chronic pain syndrome    Controlled Controlled Controlled     Plan of Care  Continue to monitor symptoms, follow-up as needed Consider left upper extremity EMG/nerve conduction velocity study with neurology  Follow-up plan:   Return for patient will call to schedule F2F appt prn.      Recent Visits Date Type Provider Dept  06/19/23 Procedure visit Cephus Collin, MD [facility]  06/06/23 Office Visit Cephus Collin, MD [facility]  Showing recent visits within past 90 days and meeting all other requirements Today's Visits Date Type Provider Dept  08/07/23 Office Visit Cephus Collin, MD [facility]  Showing today's visits and meeting all other requirements Future Appointments No visits were found meeting these conditions. Showing future appointments within next 90 days and meeting all other requirements  I discussed the assessment and treatment plan with the patient. The patient was provided an opportunity to ask questions and all were answered. The patient agreed with the plan and demonstrated an understanding of the instructions.  Patient advised to call back or seek an in-person evaluation if the symptoms or condition worsens.  Duration of encounter: .  Total time on encounter, as per AMA guidelines included both the face-to-face and non-face-to-face time personally spent by the physician and/or other qualified health care professional(s) on the day of the encounter (includes time in activities that require the physician or other qualified health care professional and does not include time in activities normally performed by  clinical staff). Physician's time may include the following activities when performed: Preparing to see the patient (e.g., pre-charting review of records, searching for previously ordered imaging, lab work, and nerve conduction tests) Review of prior analgesic pharmacotherapies. Reviewing PMP Interpreting ordered tests (e.g., lab work, imaging, nerve conduction tests) Performing  post-procedure evaluations, including interpretation of diagnostic procedures Obtaining and/or reviewing separately obtained history Performing a medically appropriate examination and/or evaluation Counseling and educating the patient/family/caregiver Ordering medications, tests, or procedures Referring and communicating with other health care professionals (when not separately reported) Documenting clinical information in the electronic or other health record Independently interpreting results (not separately reported) and communicating results to the patient/ family/caregiver Care coordination (not separately reported)  Note by: Cephus Collin, MD Date: 08/07/2023; Time: 3:31 PM

## 2023-08-28 ENCOUNTER — Telehealth: Payer: Self-pay | Admitting: Student in an Organized Health Care Education/Training Program

## 2023-08-28 DIAGNOSIS — M19012 Primary osteoarthritis, left shoulder: Secondary | ICD-10-CM

## 2023-08-28 NOTE — Telephone Encounter (Signed)
 Patient having pain in upper shoulder blade area or back end of rotator cuff. Would like to speak with nurse. He is asking if Dr Marcelino can do a shoulder injection. He was here 08-07-23 for PPE. Wants to know if he has to come in for follow up or can he get injections? Ins  is [facility] no [facility] needed

## 2023-09-03 NOTE — Telephone Encounter (Signed)
Appointment scheduled for 09/04/2023

## 2023-09-04 ENCOUNTER — Ambulatory Visit
Payer: No Typology Code available for payment source | Attending: Student in an Organized Health Care Education/Training Program | Admitting: Student in an Organized Health Care Education/Training Program

## 2023-09-04 ENCOUNTER — Ambulatory Visit
Admission: RE | Admit: 2023-09-04 | Discharge: 2023-09-04 | Disposition: A | Discharge status: Home / Self Care | Payer: No Typology Code available for payment source | Source: Ambulatory Visit | Attending: Student in an Organized Health Care Education/Training Program | Admitting: Student in an Organized Health Care Education/Training Program

## 2023-09-04 ENCOUNTER — Encounter: Payer: Self-pay | Admitting: Student in an Organized Health Care Education/Training Program

## 2023-09-04 VITALS — BP 185/82 | HR 87 | Temp 97.0°F | Resp 18 | Ht 71.0 in | Wt 240.0 lb

## 2023-09-04 DIAGNOSIS — M19012 Primary osteoarthritis, left shoulder: Secondary | ICD-10-CM | POA: Diagnosis present

## 2023-09-04 DIAGNOSIS — M542 Cervicalgia: Secondary | ICD-10-CM | POA: Diagnosis present

## 2023-09-04 DIAGNOSIS — M25512 Pain in left shoulder: Secondary | ICD-10-CM | POA: Diagnosis not present

## 2023-09-04 MED ORDER — ROPIVACAINE HCL 2 MG/ML IJ SOLN
4.0000 mL | Freq: Once | INTRAMUSCULAR | Status: AC
  Administered 2023-09-04: 4 mL via INTRA_ARTICULAR

## 2023-09-04 MED ORDER — LIDOCAINE HCL 2 % IJ SOLN
20.0000 mL | Freq: Once | INTRAMUSCULAR | Status: AC
  Administered 2023-09-04: 100 mg

## 2023-09-04 MED ORDER — LIDOCAINE HCL (PF) 2 % IJ SOLN
INTRAMUSCULAR | Status: AC
  Filled 2023-09-04: qty 10

## 2023-09-04 MED ORDER — IOHEXOL 180 MG/ML  SOLN
10.0000 mL | Freq: Once | INTRAMUSCULAR | Status: AC
  Administered 2023-09-04: 10 mL via INTRA_ARTICULAR

## 2023-09-04 MED ORDER — IOHEXOL 180 MG/ML  SOLN
INTRAMUSCULAR | Status: AC
  Filled 2023-09-04: qty 20

## 2023-09-04 MED ORDER — DEXAMETHASONE SODIUM PHOSPHATE 10 MG/ML IJ SOLN
INTRAMUSCULAR | Status: AC
  Filled 2023-09-04: qty 1

## 2023-09-04 MED ORDER — ROPIVACAINE HCL 2 MG/ML IJ SOLN
INTRAMUSCULAR | Status: AC
  Filled 2023-09-04: qty 20

## 2023-09-04 MED ORDER — DEXAMETHASONE SODIUM PHOSPHATE 10 MG/ML IJ SOLN
10.0000 mg | Freq: Once | INTRAMUSCULAR | Status: AC
  Administered 2023-09-04: 10 mg

## 2023-09-04 NOTE — Progress Notes (Signed)
Safety precautions to be maintained throughout the outpatient stay will include: orient to surroundings, keep bed in low position, maintain call bell within reach at all times, provide assistance with transfer out of bed and ambulation.

## 2023-09-04 NOTE — Patient Instructions (Signed)
Pain Management Discharge Instructions  General Discharge Instructions :  If you need to reach your doctor call: Monday-Friday 8:00 am - 4:00 pm at [phone] or toll free [phone].  After clinic hours [phone] to have operator reach doctor.  Bring all of your medication bottles to all your appointments in the pain clinic.  To cancel or reschedule your appointment with Pain Management please remember to call 24 hours in advance to avoid a fee.  Refer to the educational materials which you have been given on: General Risks, I had my Procedure. Discharge Instructions, Post Sedation.  Post Procedure Instructions:  The drugs you were given will stay in your system until tomorrow, so for the next 24 hours you should not drive, make any legal decisions or drink any alcoholic beverages.  You may eat anything you prefer, but it is better to start with liquids then soups and crackers, and gradually work up to solid foods.  Please notify your doctor immediately if you have any unusual bleeding, trouble breathing or pain that is not related to your normal pain.  Depending on the type of procedure that was done, some parts of your body may feel week and/or numb.  This usually clears up by tonight or the next day.  Walk with the use of an assistive device or accompanied by an adult for the 24 hours.  You may use ice on the affected area for the first 24 hours.  Put ice in a Ziploc bag and cover with a towel and place against area 15 minutes on 15 minutes off.  You may switch to heat after 24 hours.Trigger Point Injection Trigger points are areas where you have pain. A trigger point injection is a shot given in the trigger point to help relieve pain for a few days to a few months. Common places for trigger points include the neck, shoulders, upper back, or lower back. A trigger point injection will not cure long-term (chronic) pain permanently. These injections do not always work for every  person. For some people, they can help to relieve pain for a few days to a few months. Tell a health care provider about: Any allergies you have. All medicines you are taking, including vitamins, herbs, eye drops, creams, and over-the-counter medicines. Any problems you or family members have had with anesthetic medicines. Any bleeding problems you have. Any surgeries you have had. Any medical conditions you have. Whether you are pregnant or may be pregnant. What are the risks? Generally, this is a safe procedure. However, problems may occur, including: Infection. Bleeding or bruising. Allergic reaction to the injected medicine. Irritation of the skin around the injection site. What happens before the procedure? Ask your health care provider about: Changing or stopping your regular medicines. This is especially important if you are taking diabetes medicines or blood thinners. Taking medicines such as aspirin and ibuprofen. These medicines can thin your blood. Do not take these medicines unless your health care provider tells you to take them. Taking over-the-counter medicines, vitamins, herbs, and supplements. What happens during the procedure?  Your health care provider will feel for trigger points. A marker may be used to circle the area for the injection. The skin over the trigger point will be washed with a germ-killing soap. You may be given a medicine to help you relax (sedative). A thin needle is used for the injection. You may feel pain or a twitching feeling when the needle enters your skin. A numbing solution may be injected  into the trigger point. Sometimes a medicine to keep down inflammation is also injected. Your health care provider may move the needle around the area where the trigger point is located until the tightness and twitching goes away. After the injection, your health care provider may put gentle pressure over the injection site. The injection site will be  covered with a bandage (dressing). The procedure may vary among health care providers and hospitals. What can I expect after treatment? After treatment, you may have soreness and stiffness for 1-2 days. Follow these instructions at home: Injection site care Remove your dressing in a few hours, or as told by your health care provider. Check your injection site every day for signs of infection. Check for: Redness, swelling, or pain. Fluid or blood. Warmth. Pus or a bad smell. Managing pain, stiffness, and swelling If directed, put ice on the affected area. To do this: Put ice in a plastic bag. Place a towel between your skin and the bag. Leave the ice on for 20 minutes, 2-3 times a day. Remove the ice if your skin turns bright red. This is very important. If you cannot feel pain, heat, or cold, you have a greater risk of damage to the area. Activity If you were given a sedative during the procedure, it can affect you for several hours. Do not drive or operate machinery until your health care provider says that it is safe. Do not take baths, swim, or use a hot tub until your health care provider approves. Return to your normal activities as told by your health care provider. Ask your health care provider what activities are safe for you. General instructions If you were asked to stop your regular medicines, ask your health care provider when you may start taking them again. You may be asked to see an occupational or physical therapist for exercises that reduce muscle strain and stretch the area of the trigger point. Keep all follow-up visits. This is important. Contact a health care provider if: Your pain comes back, and it is worse than before the injection. You may need more injections. You have chills or a fever. The injection site becomes more painful, red, swollen, or warm to the touch. Summary A trigger point injection is a shot given in the trigger point to help relieve  pain. Common places for trigger point injections are the neck, shoulders, upper back, and lower back. These injections do not always work for every person, but for some people, the injections can help to relieve pain for a few days to a few months. Contact a health care provider if symptoms come back or if they are worse than before treatment. Also, get help if the injection site becomes more painful, red, swollen, or warm to the touch. This information is not intended to replace advice given to you by your health care provider. Make sure you discuss any questions you have with your health care provider. Document Revised: 10/18/2020 Document Reviewed: 10/18/2020 Elsevier Patient Education  2024 [employer].

## 2023-09-04 NOTE — Progress Notes (Signed)
PROVIDER NOTE: Interpretation of information contained herein should be left to medically-trained personnel. Specific patient instructions are provided elsewhere under "Patient Instructions" section of medical record. This document was created in part using STT-dictation technology, any transcriptional errors that may result from this process are unintentional.  Patient: Jordan Welch Type: Established DOB: 1960/10/13 MRN: [id number] P[facility]: [facility]  Service: Procedure DOS: 09/04/2023 Setting: Ambulatory Location: Ambulatory outpatient facility Delivery: Face-to-face Provider: Edward Jolly, MD Specialty: Interventional Pain Management Specialty designation: 09 Location: Outpatient facility Ref. Prov.: [facility]       Interventional Therapy   Procedure: Suprascapular nerve block (SSNB) #2 and Thoracic TPI Laterality:  Left  Level: Superior to scapular spine, lateral to supraspinatus fossa (Suprascapular notch).  Imaging: Fluoroscopic guidance         Anesthesia: Local anesthesia (1-2% Lidocaine) DOS: 09/04/2023  Performed by: Edward Jolly, MD  Purpose: Diagnostic/Therapeutic Indications: Shoulder pain, severe enough to impact quality of life and/or function. 1. Primary osteoarthritis of left shoulder   2. Cervicalgia    NAS-11 score:   Pre-procedure: 6 /10   Post-procedure: 6 /10     Target: Suprascapular nerve Location: midway between the medial border of the scapula and the acromion as it runs through the suprascapular notch. Region: Suprascapular, posterior shoulder  Approach: Percutaneous  Neuroanatomy: The suprascapular nerve is the lateral branch of the superior trunk of the brachial plexus. It receives nerve fibers that originate in the nerve roots C5 and C6 (and sometimes C4). It is a mixed nerve, meaning that it provides both sensory and motor supply for the suprascapular region. Function: The main function of this nerve is to provide motor innervation for two  muscles, the supraspinatus and infraspinatus muscles. They are part of the rotator cuff muscles. In addition, the suprascapular nerve provides a sensory supply to the joints of the scapula (glenohumeral and acromioclavicular joints). Rationale (medical necessity): procedure needed and proper for the diagnosis and/or treatment of the patient's medical symptoms and needs.  Position / Prep / Materials:  Position: Prone Materials:  Tray: Block Needle(s):  Type: Spinal  Gauge (G): 22  Length: 3.5 in.  Qty: 1 Prep solution: ChloraPrep (2% chlorhexidine gluconate and 70% isopropyl alcohol) Prep Area: Entire posterior shoulder area. From upper spine to shoulder proper (upper arm), and from lateral neck to lower tip of shoulder blade.   H&P (Pre-op Assessment):  Mr. Mastrangelo is a 63 y.o. (year old), male patient, seen today for interventional treatment. He  has a past surgical history that includes Foot surgery (Right). Mr. Carstens has a current medication list which includes the following prescription(s): alpha-lipoic acid, atorvastatin, budesonide, empagliflozin, ibuprofen, ipratropium, losartan, metformin, pantoprazole, psyllium, tamsulosin, trazodone, and venlafaxine. His primarily concern today is the Shoulder Pain (left)  Initial Vital Signs:  Pulse/HCG Rate: 87ECG Heart Rate: 83 Temp: (!) 97 F (36.1 C) Resp: 16 BP: (!) 154/67 SpO2: 98 %  BMI: Estimated body mass index is 33.47 kg/m as calculated from the following:   Height as of this encounter: 5\' 11"  (1.803 m).   Weight as of this encounter: 240 lb (108.9 kg).  Risk Assessment: Allergies: Reviewed. He has no known allergies.  Allergy Precautions: None required Coagulopathies: Reviewed. None identified.  Blood-thinner therapy: None at this time Active Infection(s): Reviewed. None identified. Mr. Dani is afebrile  Site Confirmation: Mr. Leazer was asked to confirm the procedure and laterality before marking the site Procedure checklist:  Completed Consent: Before the procedure and under the influence of no  sedative(s), amnesic(s), or anxiolytics, the patient was informed of the treatment options, risks and possible complications. To fulfill our ethical and legal obligations, as recommended by the American Medical Association's Code of Ethics, I have informed the patient of my clinical impression; the nature and purpose of the treatment or procedure; the risks, benefits, and possible complications of the intervention; the alternatives, including doing nothing; the risk(s) and benefit(s) of the alternative treatment(s) or procedure(s); and the risk(s) and benefit(s) of doing nothing. The patient was provided information about the general risks and possible complications associated with the procedure. These may include, but are not limited to: failure to achieve desired goals, infection, bleeding, organ or nerve damage, allergic reactions, paralysis, and death. In addition, the patient was informed of those risks and complications associated to the procedure, such as failure to decrease pain; infection; bleeding; organ or nerve damage with subsequent damage to sensory, motor, and/or autonomic systems, resulting in permanent pain, numbness, and/or weakness of one or several areas of the body; allergic reactions; (i.e.: anaphylactic reaction); and/or death. Furthermore, the patient was informed of those risks and complications associated with the medications. These include, but are not limited to: allergic reactions (i.e.: anaphylactic or anaphylactoid reaction(s)); adrenal axis suppression; blood sugar elevation that in diabetics may result in ketoacidosis or comma; water retention that in patients with history of congestive heart failure may result in shortness of breath, pulmonary edema, and decompensation with resultant heart failure; weight gain; swelling or edema; medication-induced neural toxicity; particulate matter embolism and blood vessel  occlusion with resultant organ, and/or nervous system infarction; and/or aseptic necrosis of one or more joints. Finally, the patient was informed that Medicine is not an exact science; therefore, there is also the possibility of unforeseen or unpredictable risks and/or possible complications that may result in a catastrophic outcome. The patient indicated having understood very clearly. We have given the patient no guarantees and we have made no promises. Enough time was given to the patient to ask questions, all of which were answered to the patient's satisfaction. Mr. Benish has indicated that he wanted to continue with the procedure. Attestation: I, the ordering provider, attest that I have discussed with the patient the benefits, risks, side-effects, alternatives, likelihood of achieving goals, and potential problems during recovery for the procedure that I have provided informed consent. Date  Time: 09/04/2023  1:44 PM  Pre-Procedure Preparation:  Monitoring: As per clinic protocol. Respiration, ETCO2, SpO2, BP, heart rate and rhythm monitor placed and checked for adequate function Safety Precautions: Patient was assessed for positional comfort and pressure points before starting the procedure. Time-out: I initiated and conducted the "Time-out" before starting the procedure, as per protocol. The patient was asked to participate by confirming the accuracy of the "Time Out" information. Verification of the correct person, site, and procedure were performed and confirmed by me, the nursing staff, and the patient. "Time-out" conducted as per Joint Commission's Universal Protocol (UP.01.01.01). Time: 1429 Start Time: 1429 hrs.  Description of Procedure:          Procedural Technique Safety Precautions: Aspiration looking for blood return was conducted prior to all injections. At no point did we inject any substances, as a needle was being advanced. No attempts were made at seeking any paresthesias. Safe  injection practices and needle disposal techniques used. Medications properly checked for expiration dates. SDV (single dose vial) medications used. Description of the Procedure: Protocol guidelines were followed. The patient was placed in position over the procedure table. The  target area was identified and the area prepped in the usual manner. Skin & deeper tissues infiltrated with local anesthetic. Appropriate amount of time allowed to pass for local anesthetics to take effect. The procedure needles were then advanced to the target area. Proper needle placement secured. Negative aspiration confirmed. Solution injected in intermittent fashion, asking for systemic symptoms every 0.5cc of injectate. The needles were then removed and the area cleansed, making sure to leave some of the prepping solution back to take advantage of its long term bactericidal properties.  5cc solution made of 4cc of 0.2% ropivacaine, 1 cc of Decadron 10 mg/cc. Injected along left SSN  Afterwards, 4 trigger points injected in medial thoracic region with 0.2% Ropivacaine 1 cc at each TPI     Vitals:   09/04/23 1350 09/04/23 1425 09/04/23 1433  BP: (!) 154/67 (!) 174/84 (!) 185/82  Pulse: 87    Resp: 16 18 18   Temp: (!) 97 F (36.1 C)    SpO2:  98% 97%  Weight: 240 lb (108.9 kg)    Height: 5\' 11"  (1.803 m)       Start Time: 1429 hrs. End Time: 1432 hrs.  Imaging Guidance (Spinal):          Type of Imaging Technique: Fluoroscopy Guidance (Spinal) Indication(s): Fluoroscopy guidance for needle placement to enhance accuracy in procedures requiring precise needle localization for targeted delivery of medication in or near specific anatomical locations not easily accessible without such real-time imaging assistance. Exposure Time: Please see nurses notes. Contrast: Before injecting any contrast, we confirmed that the patient did not have an allergy to iodine, shellfish, or radiological contrast. Once satisfactory  needle placement was completed at the desired level, radiological contrast was injected. Contrast injected under live fluoroscopy. No contrast complications. See chart for type and volume of contrast used. Fluoroscopic Guidance: I was personally present during the use of fluoroscopy. "Tunnel Vision Technique" used to obtain the best possible view of the target area. Parallax error corrected before commencing the procedure. "Direction-depth-direction" technique used to introduce the needle under continuous pulsed fluoroscopy. Once target was reached, antero-posterior, oblique, and lateral fluoroscopic projection used confirm needle placement in all planes. Images permanently stored in EMR. Interpretation: I personally interpreted the imaging intraoperatively. Adequate needle placement confirmed in multiple planes. Appropriate spread of contrast into desired area was observed. No evidence of afferent or efferent intravascular uptake. No intrathecal or subarachnoid spread observed. Permanent images saved into the patient's record.  Antibiotic Prophylaxis:   Anti-infectives (From admission, onward)    None      Indication(s): None identified  Post-operative Assessment:  Post-procedure Vital Signs:  Pulse/HCG Rate: 8781 Temp: (!) 97 F (36.1 C) Resp: 18 BP: (!) 185/82 SpO2: 97 %  EBL: None  Complications: No immediate post-treatment complications observed by team, or reported by patient.  Note: The patient tolerated the entire procedure well. A repeat set of vitals were taken after the procedure and the patient was kept under observation following institutional policy, for this type of procedure. Post-procedural neurological assessment was performed, showing return to baseline, prior to discharge. The patient was provided with post-procedure discharge instructions, including a section on how to identify potential problems. Should any problems arise concerning this procedure, the patient was  given instructions to immediately contact us, at any time, without hesitation. In any case, we plan to contact the patient by telephone for a follow-up status report regarding this interventional procedure.  Comments:  No additional relevant information.  Plan of  Care (POC)  Orders:  Orders Placed This Encounter  Procedures   DG PAIN CLINIC C-ARM 1-60 MIN NO REPORT    Intraoperative interpretation by procedural physician at [facility].    Standing Status:   Standing    Number of Occurrences:   1    Reason for exam::   Assistance in needle guidance and placement for procedures requiring needle placement in or near specific anatomical locations not easily accessible without such assistance.     Medications ordered for procedure: Meds ordered this encounter  Medications   iohexol (OMNIPAQUE) 180 MG/ML injection 10 mL    Must be Myelogram-compatible. If not available, you may substitute with a water-soluble, non-ionic, hypoallergenic, myelogram-compatible radiological contrast medium.   lidocaine (XYLOCAINE) 2 % (with pres) injection 400 mg   dexamethasone (DECADRON) injection 10 mg   ropivacaine (PF) 2 mg/mL (0.2%) (NAROPIN) injection 4 mL   Medications administered: We administered iohexol, lidocaine, dexamethasone, and ropivacaine (PF) 2 mg/mL (0.2%).  See the medical record for exact dosing, route, and time of administration.  Follow-up plan:   Return in about 6 weeks (around 10/16/2023) for PPE/ VV.      Recent Visits Date Type Provider Dept  08/07/23 Office Visit Edward Jolly, MD [facility]  06/19/23 Procedure visit Edward Jolly, MD [facility]  06/06/23 Office Visit Edward Jolly, MD [facility]  Showing recent visits within past 90 days and meeting all other requirements Today's Visits Date Type Provider Dept  09/04/23 Procedure visit Edward Jolly, MD [facility]  Showing today's visits and meeting all other  requirements Future Appointments Date Type Provider Dept  10/21/23 Appointment Edward Jolly, MD [facility]  Showing future appointments within next 90 days and meeting all other requirements  Disposition: Discharge home  Discharge (Date  Time): 09/04/2023; 1445 hrs.   Primary Care Physician: [facility] Location[facility] Note by: Edward Jolly, MD (TTS technology used. I apologize for any typographical errors that were not detected and corrected.) Date: 09/04/2023; Time: 3:15 PM  Disclaimer:  Medicine is not an Visual merchandiser. The only guarantee in medicine is that nothing is guaranteed. It is important to note that the decision to proceed with this intervention was based on the information collected from the patient. The Data and conclusions were drawn from the patient's questionnaire, the interview, and the physical examination. Because the information was provided in large part by the patient, it cannot be guaranteed that it has not been purposely or unconsciously manipulated. Every effort has been made to obtain as much relevant data as possible for this evaluation. It is important to note that the conclusions that lead to this procedure are derived in large part from the available data. Always take into account that the treatment will also be dependent on availability of resources and existing treatment guidelines, considered by other Pain Management Practitioners as being common knowledge and practice, at the time of the intervention. For Medico-Legal purposes, it is also important to point out that variation in procedural techniques and pharmacological choices are the acceptable norm. The indications, contraindications, technique, and results of the above procedure should only be interpreted and judged by a Board-Certified Interventional Pain Specialist with extensive familiarity and expertise in the same exact procedure and technique.

## 2023-09-05 ENCOUNTER — Telehealth: Payer: Self-pay

## 2023-09-05 ENCOUNTER — Telehealth: Payer: Self-pay | Admitting: *Deleted

## 2023-09-05 NOTE — Telephone Encounter (Signed)
No answer LVM regarding post-procedure follow-up.

## 2023-09-05 NOTE — Telephone Encounter (Signed)
Patient returned call for post procedure call;  reports that he did not get any relief immediately post procedure.  Returned home, used ice/heat to the area, continued to have a nagging pain rated at 3/10.  He has no pain when laying down and pain is better today.  Some muscle spasms in the area of TPI's.  Rational behind injection and medications.  Patient is reviewing plan of care while talking to me and is wondering what steps will happen next.  Continues to have pain in the left shoulder and in the TPI area.  Muscle spasms have resolved. Improvement is there in the left shoulder.  I have explained to patient that I will let BL know this information and see if he would like to do anything different.  Patient has asked if while we are in there interim and the pain is still there  if there is any medication we could give him to take the edge off and function a little better until deciding what to do next.  He also wonders if there should be an MRI performed.

## 2023-09-09 ENCOUNTER — Telehealth: Payer: Self-pay | Admitting: Student in an Organized Health Care Education/Training Program

## 2023-09-09 DIAGNOSIS — M5412 Radiculopathy, cervical region: Secondary | ICD-10-CM

## 2023-09-09 DIAGNOSIS — G894 Chronic pain syndrome: Secondary | ICD-10-CM

## 2023-09-09 DIAGNOSIS — M542 Cervicalgia: Secondary | ICD-10-CM

## 2023-09-09 MED ORDER — TIZANIDINE HCL 4 MG PO CAPS: 4.0000 mg | ORAL_CAPSULE | Freq: Two times a day (BID) | ORAL | 1 refills | Status: DC | PRN

## 2023-09-09 NOTE — Addendum Note (Signed)
Addended by: Edward Jolly on: 09/09/2023 04:04 PM   Modules accepted: Orders

## 2023-09-09 NOTE — Telephone Encounter (Signed)
PT called back to se what Lateef thinks the next step should be. PT stated that she has call on 09-05-23 was waiting on a call back. Please give patient a call. TY

## 2023-09-10 ENCOUNTER — Other Ambulatory Visit: Payer: Self-pay | Admitting: *Deleted

## 2023-09-10 ENCOUNTER — Telehealth: Payer: Self-pay | Admitting: *Deleted

## 2023-09-10 MED ORDER — TIZANIDINE HCL 4 MG PO CAPS
4.0000 mg | ORAL_CAPSULE | Freq: Two times a day (BID) | ORAL | 1 refills | Status: AC | PRN
Start: 2023-09-10 — End: 2023-11-09

## 2023-09-10 NOTE — Telephone Encounter (Signed)
Cancelled Tizanidine at [facility].

## 2023-09-16 ENCOUNTER — Telehealth: Payer: Self-pay | Admitting: Student in an Organized Health Care Education/Training Program

## 2023-09-16 NOTE — Telephone Encounter (Signed)
 Patient states muscle relaxer is not helping that much, says side effects are more than the relief. Wants to speak with a nurse.

## 2023-09-16 NOTE — Telephone Encounter (Signed)
 Returned call. Unable to make contact and no choice to leave VM.

## 2023-09-23 ENCOUNTER — Telehealth: Payer: Self-pay | Admitting: Student in an Organized Health Care Education/Training Program

## 2023-09-23 NOTE — Telephone Encounter (Signed)
 Patient states muscle relaxer is not helping that much, says side effects are more than the relief. Wants to speak with a nurse.      Patient called today with same message.

## 2023-09-23 NOTE — Telephone Encounter (Signed)
 Attempted to call patient, message left.

## 2023-09-24 NOTE — Telephone Encounter (Signed)
 Patient informed.

## 2023-09-24 NOTE — Telephone Encounter (Signed)
 Began taking Skelaxin on 09-13-23, it is not helping his trigger point pain. Do you have any suggestions for amything else to help with the pain?

## 2023-09-24 NOTE — Telephone Encounter (Signed)
 Attempted to call patient, message left.

## 2023-10-01 ENCOUNTER — Ambulatory Visit
Admission: RE | Admit: 2023-10-01 | Discharge: 2023-10-01 | Disposition: A | Discharge status: Home / Self Care | Source: Ambulatory Visit | Attending: Student in an Organized Health Care Education/Training Program | Admitting: Student in an Organized Health Care Education/Training Program

## 2023-10-01 DIAGNOSIS — M542 Cervicalgia: Secondary | ICD-10-CM | POA: Diagnosis present

## 2023-10-21 ENCOUNTER — Telehealth: Payer: Self-pay | Admitting: *Deleted

## 2023-10-21 ENCOUNTER — Ambulatory Visit
Payer: No Typology Code available for payment source | Attending: Student in an Organized Health Care Education/Training Program | Admitting: Student in an Organized Health Care Education/Training Program

## 2023-10-21 DIAGNOSIS — M5412 Radiculopathy, cervical region: Secondary | ICD-10-CM

## 2023-10-21 DIAGNOSIS — M542 Cervicalgia: Secondary | ICD-10-CM

## 2023-10-21 DIAGNOSIS — G894 Chronic pain syndrome: Secondary | ICD-10-CM

## 2023-10-21 NOTE — Telephone Encounter (Signed)
 Message from Dr. Cherylann Ratel delivered to patient. He has phone appt this afternoon.

## 2023-10-21 NOTE — Progress Notes (Signed)
 Patient: Jordan Welch  Service Category: E/M  Provider: Edward Jolly, MD  DOB: 1961-05-15  DOS: 10/21/2023  Location: Office  MRN: [id number]  Setting: Ambulatory outpatient  Referring Provider: [facility]  Type: Established Patient  Specialty: Interventional Pain Management  PCP: [facility]  Location: Remote location  Delivery: TeleHealth     Virtual Encounter - Pain Management PROVIDER NOTE: Information contained herein reflects review and annotations entered in association with encounter. Interpretation of such information and data should be left to medically-trained personnel. Information provided to patient can be located elsewhere in the medical record under "Patient Instructions". Document created using STT-dictation technology, any transcriptional errors that may result from process are unintentional.    Contact & Pharmacy Preferred: [phone] Home: [phone] (home) Mobile: [phone] (mobile) E-mail: [email or web address]  [facility] - [place] - [address] [place] [address] Phone: [phone] Fax: [phone]  [facility] - [place], [place] - [address] [place] [address] Phone: [phone] Fax: [phone]  [facility] - [place], [place] - [address] [place] [address] Phone: [phone] Fax: [phone]  [facility] [place], [place] - [address] [place] [address] Phone: [phone] Fax: [phone]   Pre-screening  Mr. Pallone offered "in-person" vs "virtual" encounter. He indicated preferring virtual for this encounter.   Reason COVID-19*  Social distancing based on CDC and AMA recommendations.   I contacted [name] on 10/21/2023 via telephone.      I clearly identified myself as Edward Jolly, MD. I verified that I was speaking with the correct person using two identifiers  (Name: Jordan Welch, and date of birth: [birth year]).  Consent I sought verbal advanced consent from [name] for virtual visit interactions. I informed Mr. Caul of possible security and privacy concerns, risks, and limitations associated with providing "not-in-person" medical evaluation and management services. I also informed Mr. Laufer of the availability of "in-person" appointments. Finally, I informed him that there would be a charge for the virtual visit and that he could be  personally, fully or partially, financially responsible for it. Mr. Bogle expressed understanding and agreed to proceed.   Historic Elements   Mr. [name] is a 63 y.o. year old, male patient evaluated today after our last contact on 09/23/2023. Mr. Griess  has a past medical history of Anxiety, High cholesterol, and Reflux. He also  has a past surgical history that includes Foot surgery (Right). Mr. Stults has a current medication list which includes the following prescription(s): alpha-lipoic acid, atorvastatin, budesonide, empagliflozin, ibuprofen, ipratropium, losartan, metformin, pantoprazole, psyllium, tamsulosin, tizanidine, trazodone, and venlafaxine. He  reports that he has never smoked. He has never used smokeless tobacco. He reports current alcohol use. He reports that he does not use drugs. Mr. Schlink has no known allergies.  BMI: Estimated body mass index is 33.47 kg/m as calculated from the following:   Height as of 09/04/23: 5\' 11"  (1.803 m).   Weight as of 09/04/23: 240 lb (108.9 kg). Last encounter: 08/07/2023. Last procedure: 09/04/2023.  HPI  Today, he is being contacted for follow-up evaluation to review C-MRI results  Discussed the use of AI scribe software for clinical note transcription with the patient, who gave verbal consent to proceed.  History of Present Illness   Dell Briner "Jordan Welch" is a 63 year old male with cervical disc herniations who presents with shoulder and arm pain.  He has a history of cervical  disc herniations at C5, C6, and C7, leading to moderate to severe stenosis along the left C7 nerve. This condition has been associated with pain in the bicep and shoulder blades, which he attributes to his neck issues. He previously underwent cervical epidural injections in January and November, which provided some relief.  He experiences shoulder and arm pain, which has significantly reduced following an MRI, although it has not completely resolved. The pain originates from the area inside the shoulder blade where he previously received trigger point injections. It occasionally radiates across the upper chest and down the triceps to the elbow, particularly around the 'funny bone' area. Despite the reduction in pain, these symptoms occur intermittently.  He is currently managing his condition with periodic epidural injections.        Imaging  MR CERVICAL SPINE WO CONTRAST CLINICAL DATA:  Initial evaluation for chronic neck pain with pain in left shoulder.  EXAM: MRI CERVICAL SPINE WITHOUT CONTRAST  TECHNIQUE: Multiplanar, multisequence MR imaging of the cervical spine was performed. No intravenous contrast was administered.  COMPARISON:  Prior MRI from 06/16/2019 as well as prior radiograph from 02/25/2020.  FINDINGS: Alignment: Straightening of the normal cervical lordosis. Trace anterolisthesis of C2 on C3. Appearance is stable.  Vertebrae: Vertebral body height maintained without acute or chronic fracture. Bone marrow signal intensity within normal limits. No worrisome osseous lesions or abnormal marrow edema.  Cord: Normal signal and morphology.  Posterior Fossa, vertebral arteries, paraspinal tissues: Unremarkable.  Disc levels:  C2-C3: Small central disc protrusion minimally indents the ventral thecal sac (series 8, image 10). No spinal stenosis. Superimposed left greater than right uncovertebral spurring without significant foraminal encroachment. Appearance is  relatively stable.  C3-C4: Mild diffuse disc bulge with bilateral uncovertebral spurring. Superimposed small central soft disc protrusion (series 8, image 16). Flattening of the ventral thecal sac without significant spinal stenosis. Foramina remain adequately patent. Changes are mildly progressed from prior.  C4-C5: Mild disc bulge with bilateral uncovertebral spurring. No spinal stenosis. Foramina remain patent. Appearance is minimally progressed from prior.  C5-C6: Mild degenerative intervertebral disc space narrowing with diffuse disc osteophyte complex, asymmetric to the right. Posterior component flattens and partially faces the ventral thecal sac. Secondary cord flattening without cord signal changes. Mild-to-moderate spinal stenosis. Moderate right with mild-to-moderate left C6 foraminal narrowing. Changes are mildly progressed from prior.  C6-C7: Left eccentric disc bulge with bilateral uncovertebral spurring. Flattening of the ventral thecal sac, asymmetric to the left. Mild cord flattening the cord signal changes. Mild to moderate spinal stenosis. Moderate to severe left with mild to moderate right C7 foraminal stenosis. Changes are progressed from prior.  C7-T1: Normal interspace. Mild bilateral facet hypertrophy. No stenosis.  IMPRESSION: 1. Left eccentric disc osteophyte complex at C6-7 with resultant mild to moderate canal and moderate to severe left C7 foraminal stenosis. 2. Right eccentric disc osteophyte complex at C5-6 with resultant mild to moderate canal, with moderate right and mild-to-moderate left C6 foraminal stenosis. 3. Additional mild noncompressive disc bulging at C2-3 through C4-5 without significant stenosis or impingement. 4. Overall, these changes are mildly progressed as compared to previous MRI from 06/16/2019.  Electronically Signed   By: Rise Mu M.D.   On: 10/14/2023 04:13  Assessment  The primary encounter diagnosis was  Cervical radicular pain. Diagnoses of Cervicalgia and Chronic pain syndrome were also pertinent to this visit.  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 Symptoms include pain in the shoulder blade, upper chest, triceps, and elbow. Pain has decreased significantly since the MRI, but the underlying issue persists. A previous cervical epidural in January provided relief. If symptoms recur, a repeat cervical epidural is recommended. Significant worsening of symptoms may necessitate a referral to a neurosurgeon for surgical evaluation, potentially involving fusion at C5-C6 and C6-C7. Informed consent includes understanding that epidural injections provide temporary relief and surgery is the alternative if injections become ineffective. Schedule a cervical epidural injection. Consider referral to a neurosurgeon if symptoms worsen or if epidural injections become ineffective. Discuss surgical options, including potential fusion at C5-C6 and C6-C7, if conservative management fails.  Myofascial pain syndrome   Point tenderness along the scapular spine and back suggests myofascial pain syndrome. Previous trigger point injections may be repeated based on symptom severity. Consider trigger point injections if point tenderness persists.      1. Cervical radicular pain (Primary) - Cervical Epidural Injection; Future  2. Cervicalgia - Cervical Epidural Injection; Future  3. Chronic pain syndrome - Cervical Epidural Injection; Future    Orders:  Orders Placed This Encounter  Procedures   Cervical Epidural Injection    Sedation: Patient's choice. Purpose: Diagnostic/Therapeutic Indication(s): Radiculitis and cervicalgia associater with cervical degenerative disc disease.    Standing Status:   Future    Expiration Date:    01/20/2024    Scheduling Instructions:     Procedure: Cervical Epidural Steroid Injection/Block     Level(s): C7-T1     Laterality: TBD     Timeframe: As soon as schedule allows    Where will this procedure be performed?:   [facility]             Evely Gainey   Follow-up plan:   No follow-ups on file.     Recent Visits Date Type Provider Dept  09/04/23 Procedure visit Edward Jolly, MD [facility]  08/07/23 Office Visit Edward Jolly, MD [facility]  Showing recent visits within past 90 days and meeting all other requirements Today's Visits Date Type Provider Dept  10/21/23 Office Visit Edward Jolly, MD [facility]  Showing today's visits and meeting all other requirements Future Appointments No visits were found meeting these conditions. Showing future appointments within next 90 days and meeting all other requirements  I discussed the assessment and treatment plan with the patient. The patient was provided an opportunity to ask questions and all were answered. The patient agreed with the plan and demonstrated an understanding of the instructions.  Patient advised to call back or seek an in-person evaluation if the symptoms or condition worsens.  Duration of encounter: .  Note by: Edward Jolly, MD Date: 10/21/2023; Time: 4:01 PM

## 2023-10-21 NOTE — Telephone Encounter (Signed)
 Attempted to call patient to deliver message from Dr. Cherylann Ratel, message left.

## 2023-10-21 NOTE — Telephone Encounter (Signed)
-----   Message from Edward Jolly sent at 10/21/2023  1:47 PM EDT ----- Regarding: Call patient to inform him of cervical MRI results and treatment plan Please call and inform patient of the cervical MRI results.  If you would like to come in face-to-face to discuss them in detail that is fine as well.  I believe his shoulder pain is coming from his cervical spine due to disc herniations and nerve compression.  If he would like to repeat his cervical epidural steroid injection that he received in January which provided him with relief, let me know and I will place order ----- Message ----- From: Interface, Rad Results In Sent: 10/14/2023   4:16 AM EDT To: Edward Jolly, MD

## 2023-10-21 NOTE — Telephone Encounter (Signed)
-----   Message from Jordan Welch sent at 10/21/2023  1:47 PM EDT ----- Regarding: Call patient to inform him of cervical MRI results and treatment plan Please call and inform patient of the cervical MRI results.  If you would like to come in face-to-face to discuss them in detail that is fine as well.  I believe his shoulder pain is coming from his cervical spine due to disc herniations and nerve compression.  If he would like to repeat his cervical epidural steroid injection that he received in January which provided him with relief, let me know and I will place order ----- Message ----- From: Interface, Rad Results In Sent: 10/14/2023   4:16 AM EDT To: Jordan Jolly, MD

## 2023-11-20 ENCOUNTER — Ambulatory Visit
Admission: RE | Admit: 2023-11-20 | Discharge: 2023-11-20 | Disposition: A | Discharge status: Home / Self Care | Source: Ambulatory Visit | Attending: Student in an Organized Health Care Education/Training Program | Admitting: Student in an Organized Health Care Education/Training Program

## 2023-11-20 ENCOUNTER — Other Ambulatory Visit: Payer: Self-pay | Admitting: Student in an Organized Health Care Education/Training Program

## 2023-11-20 ENCOUNTER — Ambulatory Visit (HOSPITAL_BASED_OUTPATIENT_CLINIC_OR_DEPARTMENT_OTHER): Admitting: Student in an Organized Health Care Education/Training Program

## 2023-11-20 ENCOUNTER — Encounter: Payer: Self-pay | Admitting: Student in an Organized Health Care Education/Training Program

## 2023-11-20 DIAGNOSIS — G894 Chronic pain syndrome: Secondary | ICD-10-CM

## 2023-11-20 DIAGNOSIS — M542 Cervicalgia: Secondary | ICD-10-CM

## 2023-11-20 DIAGNOSIS — M5412 Radiculopathy, cervical region: Secondary | ICD-10-CM | POA: Insufficient documentation

## 2023-11-20 MED ORDER — DEXAMETHASONE SODIUM PHOSPHATE 10 MG/ML IJ SOLN
INTRAMUSCULAR | Status: AC
  Filled 2023-11-20: qty 1

## 2023-11-20 MED ORDER — DEXAMETHASONE SODIUM PHOSPHATE 10 MG/ML IJ SOLN
10.0000 mg | Freq: Once | INTRAMUSCULAR | Status: AC
  Administered 2023-11-20: 10 mg

## 2023-11-20 MED ORDER — LIDOCAINE HCL 2 % IJ SOLN
20.0000 mL | Freq: Once | INTRAMUSCULAR | Status: AC
  Administered 2023-11-20: 400 mg

## 2023-11-20 MED ORDER — ROPIVACAINE HCL 2 MG/ML IJ SOLN
1.0000 mL | Freq: Once | INTRAMUSCULAR | Status: AC
  Administered 2023-11-20: 1 mL via EPIDURAL

## 2023-11-20 MED ORDER — IOHEXOL 180 MG/ML  SOLN
10.0000 mL | Freq: Once | INTRAMUSCULAR | Status: AC
  Administered 2023-11-20: 10 mL via EPIDURAL

## 2023-11-20 MED ORDER — IOHEXOL 180 MG/ML  SOLN
INTRAMUSCULAR | Status: AC
  Filled 2023-11-20: qty 20

## 2023-11-20 MED ORDER — SODIUM CHLORIDE 0.9% FLUSH
1.0000 mL | Freq: Once | INTRAVENOUS | Status: AC
  Administered 2023-11-20: 1 mL

## 2023-11-20 MED ORDER — LIDOCAINE HCL (PF) 2 % IJ SOLN
INTRAMUSCULAR | Status: AC
  Filled 2023-11-20: qty 10

## 2023-11-20 MED ORDER — SODIUM CHLORIDE (PF) 0.9 % IJ SOLN
INTRAMUSCULAR | Status: AC
  Filled 2023-11-20: qty 10

## 2023-11-20 MED ORDER — ROPIVACAINE HCL 2 MG/ML IJ SOLN
INTRAMUSCULAR | Status: AC
  Filled 2023-11-20: qty 20

## 2023-11-20 NOTE — Progress Notes (Signed)
 PROVIDER NOTE: Interpretation of information contained herein should be left to medically-trained personnel. Specific patient instructions are provided elsewhere under "Patient Instructions" section of medical record. This document was created in part using STT-dictation technology, any transcriptional errors that may result from this process are unintentional.  Patient: Jordan Welch Type: Established DOB: 04/10/1961 MRN: [id number] PCP: [facility]  Service: Procedure DOS: 11/20/2023 Setting: Ambulatory Location: Ambulatory outpatient facility Delivery: Face-to-face Provider: Cephus Collin, MD Specialty: Interventional Pain Management Specialty designation: 09 Location: Outpatient facility Ref. Prov.: [facility]       Interventional Therapy   Procedure: Cervical Epidural Steroid injection (CESI) (Interlaminar) #1  Laterality: Left  Level: C7-T1 Imaging: Fluoroscopy-assisted DOS: 11/20/2023  Performed by: Cephus Collin, MD Anesthesia: Local anesthesia (1-2% Lidocaine ) Sedation: No Sedation                         Purpose: Diagnostic/Therapeutic Indications: Cervicalgia, cervical radicular pain, degenerative disc disease, severe enough to impact quality of life or function. 1. Cervical radicular pain   2. Cervicalgia   3. Chronic pain syndrome    NAS-11 score:   Pre-procedure: 2 /10   Post-procedure: 0-No pain/10      Position  Prep  Materials:  Location setting: Procedure suite Position: Prone, on modified reverse trendelenburg to facilitate breathing, with head in head-cradle. Pillows positioned under chest (below chin-level) with cervical spine flexed. Safety Precautions: Patient was assessed for positional comfort and pressure points before starting the procedure. Prepping solution: DuraPrep (Iodine Povacrylex [0.7% available iodine] and Isopropyl Alcohol, 74% w/w) Prep Area: Entire  cervicothoracic region Approach: percutaneous, paramedial Intended target:  Posterior cervical epidural space Materials Procedure:  Tray: Epidural Needle(s): Epidural (Tuohy) Qty: 1 Length: (90mm) 3.5-inch Gauge: 22G  H&P (Pre-op Assessment):  Mr. Dinh is a 63 y.o. (year old), male patient, seen today for interventional treatment. He  has a past surgical history that includes Foot surgery (Right). Mr. Stfleur has a current medication list which includes the following prescription(s): alpha-lipoic acid, atorvastatin, budesonide, empagliflozin, fluorouracil, ibuprofen, ipratropium, losartan, metformin, pantoprazole, psyllium, tamsulosin, trazodone, and venlafaxine. His primarily concern today is the Neck Pain  Initial Vital Signs:  Pulse/HCG Rate: 84ECG Heart Rate: 85 Temp: (!) 97.1 F (36.2 C) Resp: 12 BP: 138/62 SpO2: 100 %  BMI: Estimated body mass index is 32.08 kg/m as calculated from the following:   Height as of this encounter: 5\' 11"  (1.803 m).   Weight as of this encounter: 230 lb (104.3 kg).  Risk Assessment: Allergies: Reviewed. He has no known allergies.  Allergy Precautions: None required Coagulopathies: Reviewed. None identified.  Blood-thinner therapy: None at this time Active Infection(s): Reviewed. None identified. Mr. Merkin is afebrile  Site Confirmation: Mr. Schaufler was asked to confirm the procedure and laterality before marking the site Procedure checklist: Completed Consent: Before the procedure and under the influence of no sedative(s), amnesic(s), or anxiolytics, the patient was informed of the treatment options, risks and possible complications. To fulfill our ethical and legal obligations, as recommended by the American Medical Association's Code of Ethics, I have informed the patient of my clinical impression; the nature and purpose of the treatment or procedure; the risks, benefits, and possible complications of the intervention; the alternatives, including doing nothing; the risk(s) and benefit(s) of the alternative treatment(s) or  procedure(s); and the risk(s) and benefit(s) of doing nothing. The patient was provided information about the general risks and possible complications associated with the procedure. These may include, but  are not limited to: failure to achieve desired goals, infection, bleeding, organ or nerve damage, allergic reactions, paralysis, and death. In addition, the patient was informed of those risks and complications associated to Spine-related procedures, such as failure to decrease pain; infection (i.e.: Meningitis, epidural or intraspinal abscess); bleeding (i.e.: epidural hematoma, subarachnoid hemorrhage, or any other type of intraspinal or peri-dural bleeding); organ or nerve damage (i.e.: Any type of peripheral nerve, nerve root, or spinal cord injury) with subsequent damage to sensory, motor, and/or autonomic systems, resulting in permanent pain, numbness, and/or weakness of one or several areas of the body; allergic reactions; (i.e.: anaphylactic reaction); and/or death. Furthermore, the patient was informed of those risks and complications associated with the medications. These include, but are not limited to: allergic reactions (i.e.: anaphylactic or anaphylactoid reaction(s)); adrenal axis suppression; blood sugar elevation that in diabetics may result in ketoacidosis or comma; water retention that in patients with history of congestive heart failure may result in shortness of breath, pulmonary edema, and decompensation with resultant heart failure; weight gain; swelling or edema; medication-induced neural toxicity; particulate matter embolism and blood vessel occlusion with resultant organ, and/or nervous system infarction; and/or aseptic necrosis of one or more joints. Finally, the patient was informed that Medicine is not an exact science; therefore, there is also the possibility of unforeseen or unpredictable risks and/or possible complications that may result in a catastrophic outcome. The patient  indicated having understood very clearly. We have given the patient no guarantees and we have made no promises. Enough time was given to the patient to ask questions, all of which were answered to the patient's satisfaction. Mr. Urbanovsky has indicated that he wanted to continue with the procedure. Attestation: I, the ordering provider, attest that I have discussed with the patient the benefits, risks, side-effects, alternatives, likelihood of achieving goals, and potential problems during recovery for the procedure that I have provided informed consent. Date  Time: 11/20/2023  1:52 PM  Pre-Procedure Preparation:  Monitoring: As per clinic protocol. Respiration, ETCO2, SpO2, BP, heart rate and rhythm monitor placed and checked for adequate function Safety Precautions: Patient was assessed for positional comfort and pressure points before starting the procedure. Time-out: I initiated and conducted the "Time-out" before starting the procedure, as per protocol. The patient was asked to participate by confirming the accuracy of the "Time Out" information. Verification of the correct person, site, and procedure were performed and confirmed by me, the nursing staff, and the patient. "Time-out" conducted as per Joint Commission's Universal Protocol (UP.01.01.01). Time: 1445 Start Time: 1445 hrs.  Description  Narrative of Procedure:          Rationale (medical necessity): procedure needed and proper for the diagnosis and/or treatment of the patient's medical symptoms and needs. Start Time: 1445 hrs. Safety Precautions: Aspiration looking for blood return was conducted prior to all injections. At no point did we inject any substances, as a needle was being advanced. No attempts were made at seeking any paresthesias. Safe injection practices and needle disposal techniques used. Medications properly checked for expiration dates. SDV (single dose vial) medications used. Description of procedure: Protocol guidelines  were followed. The patient was assisted into a comfortable position. The target area was identified and the area prepped in the usual manner. Skin & deeper tissues infiltrated with local anesthetic. Appropriate amount of time allowed to pass for local anesthetics to take effect. Using fluoroscopic guidance, the epidural needle was introduced through the skin, ipsilateral to the reported pain, and advanced  to the target area. Posterior laminar os was contacted and the needle walked caudad, until the lamina was cleared. The ligamentum flavum was engaged and the epidural space identified using "loss-of-resistance technique" with 2-3 ml of PF-NaCl (0.9% NSS), in a 5cc dedicated LOR syringe. (See "Imaging guidance" below for use of contrast details.) Once proper needle placement was secured, and negative aspiration confirmed, the solution was injected in intermittent fashion, asking for systemic symptoms every 0.5cc. The needles were then removed and the area cleansed, making sure to leave some of the prepping solution back to take advantage of its long term bactericidal properties.  Vitals:   11/20/23 1401 11/20/23 1441 11/20/23 1446 11/20/23 1449  BP: 138/62 (!) 142/92 (!) 130/113 138/84  Pulse: 84     Resp:  12 14 12   Temp: (!) 97.1 F (36.2 C)     SpO2: 100% 100% 100% 100%  Weight: 230 lb (104.3 kg)     Height: 5\' 11"  (1.803 m)        End Time: 1449 hrs.  Imaging Guidance (Spinal):          Type of Imaging Technique: Fluoroscopy Guidance (Spinal) Indication(s): Fluoroscopy guidance for needle placement to enhance accuracy in procedures requiring precise needle localization for targeted delivery of medication in or near specific anatomical locations not easily accessible without such real-time imaging assistance. Exposure Time: Please see nurses notes. Contrast: Before injecting any contrast, we confirmed that the patient did not have an allergy to iodine, shellfish, or radiological contrast. Once  satisfactory needle placement was completed at the desired level, radiological contrast was injected. Contrast injected under live fluoroscopy. No contrast complications. See chart for type and volume of contrast used. Fluoroscopic Guidance: I was personally present during the use of fluoroscopy. "Tunnel Vision Technique" used to obtain the best possible view of the target area. Parallax error corrected before commencing the procedure. "Direction-depth-direction" technique used to introduce the needle under continuous pulsed fluoroscopy. Once target was reached, antero-posterior, oblique, and lateral fluoroscopic projection used confirm needle placement in all planes. Images permanently stored in EMR. Interpretation: I personally interpreted the imaging intraoperatively. Adequate needle placement confirmed in multiple planes. Appropriate spread of contrast into desired area was observed. No evidence of afferent or efferent intravascular uptake. No intrathecal or subarachnoid spread observed. Permanent images saved into the patient's record.  Post-operative Assessment:  Post-procedure Vital Signs:  Pulse/HCG Rate: 8474 Temp: (!) 97.1 F (36.2 C) Resp: 12 BP: 138/84 SpO2: 100 %  EBL: None  Complications: No immediate post-treatment complications observed by team, or reported by patient.  Note: The patient tolerated the entire procedure well. A repeat set of vitals were taken after the procedure and the patient was kept under observation following institutional policy, for this type of procedure. Post-procedural neurological assessment was performed, showing return to baseline, prior to discharge. The patient was provided with post-procedure discharge instructions, including a section on how to identify potential problems. Should any problems arise concerning this procedure, the patient was given instructions to immediately contact us , at any time, without hesitation. In any case, we plan to contact  the patient by telephone for a follow-up status report regarding this interventional procedure.  Comments:  No additional relevant information.  Plan of Care (POC)  Orders:  No orders of the defined types were placed in this encounter.   Medications ordered for procedure: Meds ordered this encounter  Medications   iohexol  (OMNIPAQUE ) 180 MG/ML injection 10 mL    Must be Myelogram-compatible. If  not available, you may substitute with a water-soluble, non-ionic, hypoallergenic, myelogram-compatible radiological contrast medium.   lidocaine  (XYLOCAINE ) 2 % (with pres) injection 400 mg   ropivacaine  (PF) 2 mg/mL (0.2%) (NAROPIN ) injection 1 mL   sodium chloride  flush (NS) 0.9 % injection 1 mL   dexamethasone  (DECADRON ) injection 10 mg   Medications administered: We administered iohexol , lidocaine , ropivacaine  (PF) 2 mg/mL (0.2%), sodium chloride  flush, and dexamethasone .  See the medical record for exact dosing, route, and time of administration.  Follow-up plan:   Return in about 4 weeks (around 12/18/2023), or PPE VV.       Recent Visits Date Type Provider Dept  10/21/23 Office Visit Cephus Collin, MD [facility]  09/04/23 Procedure visit Cephus Collin, MD [facility]  Showing recent visits within past 90 days and meeting all other requirements Today's Visits Date Type Provider Dept  11/20/23 Procedure visit Cephus Collin, MD [facility]  Showing today's visits and meeting all other requirements Future Appointments Date Type Provider Dept  12/19/23 Appointment Cephus Collin, MD [facility]  Showing future appointments within next 90 days and meeting all other requirements  Disposition: Discharge home  Discharge (Date  Time): 11/20/2023; 1457 hrs.   Primary Care Physician: [facility] Location: [facility] Note by: Cephus Collin, MD (TTS technology used. I apologize for any typographical errors that  were not detected and corrected.) Date: 11/20/2023; Time: 2:58 PM  Disclaimer:  Medicine is not an Visual merchandiser. The only guarantee in medicine is that nothing is guaranteed. It is important to note that the decision to proceed with this intervention was based on the information collected from the patient. The Data and conclusions were drawn from the patient's questionnaire, the interview, and the physical examination. Because the information was provided in large part by the patient, it cannot be guaranteed that it has not been purposely or unconsciously manipulated. Every effort has been made to obtain as much relevant data as possible for this evaluation. It is important to note that the conclusions that lead to this procedure are derived in large part from the available data. Always take into account that the treatment will also be dependent on availability of resources and existing treatment guidelines, considered by other Pain Management Practitioners as being common knowledge and practice, at the time of the intervention. For Medico-Legal purposes, it is also important to point out that variation in procedural techniques and pharmacological choices are the acceptable norm. The indications, contraindications, technique, and results of the above procedure should only be interpreted and judged by a Board-Certified Interventional Pain Specialist with extensive familiarity and expertise in the same exact procedure and technique.

## 2023-11-20 NOTE — Progress Notes (Signed)
 Safety precautions to be maintained throughout the outpatient stay will include: orient to surroundings, keep bed in low position, maintain call bell within reach at all times, provide assistance with transfer out of bed and ambulation.

## 2023-11-20 NOTE — Patient Instructions (Signed)

## 2023-11-21 ENCOUNTER — Telehealth: Payer: Self-pay | Admitting: *Deleted

## 2023-11-21 NOTE — Telephone Encounter (Signed)
 No problems post procedure.

## 2023-12-13 NOTE — Progress Notes (Unsigned)
 Called to confirm appt and to ask PP questions. No answer, left message to call us  back

## 2023-12-17 ENCOUNTER — Ambulatory Visit
Attending: Student in an Organized Health Care Education/Training Program | Admitting: Student in an Organized Health Care Education/Training Program

## 2023-12-17 DIAGNOSIS — M5412 Radiculopathy, cervical region: Secondary | ICD-10-CM | POA: Diagnosis not present

## 2023-12-17 DIAGNOSIS — M542 Cervicalgia: Secondary | ICD-10-CM

## 2023-12-17 DIAGNOSIS — G894 Chronic pain syndrome: Secondary | ICD-10-CM | POA: Diagnosis not present

## 2023-12-17 NOTE — Progress Notes (Signed)
 PROVIDER NOTE: Interpretation of information contained herein should be left to medically-trained personnel. Specific patient instructions are provided elsewhere under "Patient Instructions" section of medical record. This document was created in part using AI and STT-dictation technology, any transcriptional errors that may result from this process are unintentional.  Patient: Jordan Welch  Service: E/M   PCP: [facility]  DOB: [birth year]  DOS: 12/17/2023  Provider: Cephus Collin, MD  MRN: [id number]  Delivery: Virtual Visit  Specialty: Interventional Pain Management  Type: Established Patient  Setting: Ambulatory outpatient facility  Specialty designation: 09  Referring Prov.: [facility]  Location: Remote location       Virtual Encounter - Pain Management PROVIDER NOTE: Information contained herein reflects review and annotations entered in association with encounter. Interpretation of such information and data should be left to medically-trained personnel. Information provided to patient can be located elsewhere in the medical record under "Patient Instructions". Document created using STT-dictation technology, any transcriptional errors that may result from process are unintentional.    Contact & Pharmacy Preferred: [phone] Home: [phone] (home) Mobile: [phone] (mobile) E-mail: [email or web address]  [facility] - [place] - [address] [place] [address] Phone: [phone] Fax: [phone]  [facility] - [place], [place] - [address] [place] [address] Phone: [phone] Fax: [phone]  [facility] - [place], [place] - [address] [place] [address] Phone: [phone] Fax: [phone]  [facility] [place], [place] - [address] [place] [address] Phone: [phone] Fax: [phone]    Pre-screening  Jordan Welch offered "in-person" vs "virtual" encounter. He indicated preferring virtual for this encounter.   Reason COVID-19*  Social distancing based on CDC and AMA recommendations.   I contacted Jordan Welch on 12/17/2023 via telephone.      I clearly identified myself as Cephus Collin, MD. I verified that I was speaking with the correct person using two identifiers (Name: Jordan Welch, and date of birth: [birth year]).  Consent I sought verbal advanced consent from Jordan Welch for virtual visit interactions. I informed Jordan Welch of possible security and privacy concerns, risks, and limitations associated with providing "not-in-person" medical evaluation and management services. I also informed Jordan Welch of the availability of "in-person" appointments. Finally, I informed him that there would be a charge for the virtual visit and that he could be  personally, fully or partially, financially responsible for it. Jordan Welch expressed understanding and agreed to proceed.   Historic Elements   Jordan Welch is a 63 y.o. year old, male patient evaluated today after our last contact on 11/20/2023. Jordan Welch  has a past medical history of Anxiety, High cholesterol, and Reflux. He also  has a past surgical history that includes Foot surgery (Right). Jordan Welch has a current medication list which includes the following prescription(s): alpha-lipoic acid, atorvastatin, budesonide, empagliflozin, fluorouracil, ibuprofen, ipratropium, losartan, metformin, pantoprazole, psyllium, tamsulosin, trazodone, and venlafaxine. He  reports that he has never smoked. He has never used smokeless tobacco. He reports current alcohol use. He reports that he does not use drugs. Jordan Welch has no known allergies.  BMI: Estimated body mass index is 32.08 kg/m as calculated from the following:   Height as of 11/20/23: 5\' 11"  (1.803 m).   Weight as of 11/20/23: 230 lb (104.3 kg). Last encounter: 10/21/2023. Last procedure:  11/20/2023.  HPI  Today, he is being contacted  for   Post-procedure evaluation  Procedure: Cervical Epidural Steroid injection (CESI) (Interlaminar) #1  Laterality: Left  Level: C7-T1 Imaging: Fluoroscopy-assisted DOS: 11/20/2023  Performed by: Cephus Collin, MD Anesthesia: Local anesthesia (1-2% Lidocaine ) Sedation: No Sedation                         Purpose: Diagnostic/Therapeutic Indications: Cervicalgia, cervical radicular pain, degenerative disc disease, severe enough to impact quality of life or function. 1. Cervical radicular pain   2. Cervicalgia   3. Chronic pain syndrome    NAS-11 score:   Pre-procedure: 2 /10   Post-procedure: 0-No pain/10     Effectiveness:  Initial hour after procedure: 100 %  Subsequent 4-6 hours post-procedure: 100 %  Analgesia past initial 6 hours: 95 %  Ongoing improvement:  Analgesic:  95% Function: Jordan Welch reports improvement in function ROM: Jordan Welch reports improvement in ROM    Plan of Care  1. Cervical radicular pain (Primary)  2. Cervicalgia  3. Chronic pain syndrome  Excellent response to previous C-ESI Continue to monitor sx Repeat PRN   Follow-up plan:   Return for patient will call to schedule F2F appt prn.     Recent Visits Date Type Provider Dept  11/20/23 Procedure visit Cephus Collin, MD [facility]  10/21/23 Office Visit Cephus Collin, MD [facility]  Showing recent visits within past 90 days and meeting all other requirements Today's Visits Date Type Provider Dept  12/17/23 Office Visit Cephus Collin, MD [facility]  Showing today's visits and meeting all other requirements Future Appointments No visits were found meeting these conditions. Showing future appointments within next 90 days and meeting all other requirements  I discussed the assessment and treatment plan with the patient. The patient was provided an opportunity to ask questions and all were answered. The  patient agreed with the plan and demonstrated an understanding of the instructions.  Patient advised to call back or seek an in-person evaluation if the symptoms or condition worsens.  Duration of encounter: .  Note by: Cephus Collin, MD Date: 12/17/2023; Time: 2:56 PM

## 2023-12-19 ENCOUNTER — Telehealth: Admitting: Student in an Organized Health Care Education/Training Program

## 2024-01-16 ENCOUNTER — Ambulatory Visit
Attending: Student in an Organized Health Care Education/Training Program | Admitting: Student in an Organized Health Care Education/Training Program

## 2024-01-16 DIAGNOSIS — M5410 Radiculopathy, site unspecified: Secondary | ICD-10-CM | POA: Insufficient documentation

## 2024-01-16 DIAGNOSIS — G894 Chronic pain syndrome: Secondary | ICD-10-CM | POA: Insufficient documentation

## 2024-01-16 DIAGNOSIS — M542 Cervicalgia: Secondary | ICD-10-CM | POA: Diagnosis present

## 2024-01-16 DIAGNOSIS — M5412 Radiculopathy, cervical region: Secondary | ICD-10-CM | POA: Insufficient documentation

## 2024-01-16 DIAGNOSIS — M19012 Primary osteoarthritis, left shoulder: Secondary | ICD-10-CM | POA: Insufficient documentation

## 2024-01-16 DIAGNOSIS — M5416 Radiculopathy, lumbar region: Secondary | ICD-10-CM | POA: Diagnosis present

## 2024-01-16 NOTE — Progress Notes (Signed)
 PROVIDER NOTE: Interpretation of information contained herein should be left to medically-trained personnel. Specific patient instructions are provided elsewhere under Patient Instructions section of medical record. This document was created in part using AI and STT-dictation technology, any transcriptional errors that may result from this process are unintentional.  Patient: Jordan Welch  Service: E/M   PCP: [facility]  DOB: 04-04-1961  DOS: 01/16/2024  Provider: Wallie Sherry, MD  MRN: [id number]  Delivery: Virtual Visit  Specialty: Interventional Pain Management  Type: Established Patient  Setting: Ambulatory outpatient facility  Specialty designation: 09  Referring Prov.: Christiana Gun, MD  Location: Remote location       Virtual Encounter - Pain Management PROVIDER NOTE: Information contained herein reflects review and annotations entered in association with encounter. Interpretation of such information and data should be left to medically-trained personnel. Information provided to patient can be located elsewhere in the medical record under Patient Instructions. Document created using STT-dictation technology, any transcriptional errors that may result from process are unintentional.    Contact & Pharmacy Preferred: [phone] Home: [phone] (home) Mobile: [phone] (mobile) E-mail: [email or web address]  [facility] - [place] - [address] [place] [address] Phone: [phone] Fax: [phone]  [facility] - [place], [place] - [address] [place] [address] Phone: [phone] Fax: [phone]  [facility] - [place], [place] - [address] [place] [address] Phone: [phone] Fax: [phone]  [facility] [place], [place] - [address] [place] [address] Phone: [phone] Fax: [phone]    Pre-screening  Jordan Welch offered in-person vs virtual encounter. He indicated preferring virtual for this encounter.   Reason COVID-19*  Social distancing based on CDC and AMA recommendations.   I contacted Jordan Welch on 01/16/2024 via telephone.      I clearly identified myself as Wallie Sherry, MD. I verified that I was speaking with the correct person using two identifiers (Name: Jordan Welch, and date of birth: 1960/09/10).  Consent I sought verbal advanced consent from Jordan Welch for virtual visit interactions. I informed Jordan Welch of possible security and privacy concerns, risks, and limitations associated with providing not-in-person medical evaluation and management services. I also informed Jordan Welch of the availability of in-person appointments. Finally, I informed him that there would be a charge for the virtual visit and that he could be  personally, fully or partially, financially responsible for it. Jordan Welch expressed understanding and agreed to proceed.   Historic Elements   Jordan Welch is a 63 y.o. year old, male patient evaluated today after our last contact on 12/17/2023. Jordan Welch  has a past medical history of Anxiety, High cholesterol, and Reflux. He also  has a past surgical history that includes Foot surgery (Right). Jordan Welch has a current medication list which includes the following prescription(s): alpha-lipoic acid, atorvastatin, budesonide, empagliflozin, fluorouracil, ibuprofen, ipratropium, losartan, metformin, pantoprazole, psyllium, tamsulosin, trazodone, and venlafaxine. He  reports that he has never smoked. He has never used smokeless tobacco. He reports current alcohol use. He reports that he does not use drugs. Jordan Welch has no known allergies.  BMI: Estimated body mass index is 32.08 kg/m as calculated from the following:   Height as of 11/20/23: 5' 11 (1.803 m).   Weight as of 11/20/23: 230 lb (104.3 kg). Last encounter: 12/17/2023. Last procedure:  11/20/2023.  HPI  Today, he is being contacted  for a post-procedure assessment. Doing well after C-ESI Lower back is also doing better Will continue to monitor Continue with stretching exercises   Assessment  The primary encounter diagnosis was Cervical radicular pain. Diagnoses of Cervicalgia, Primary osteoarthritis of left shoulder, Radicular pain (lumbar), Lumbar radicular pain, and Chronic pain syndrome were also pertinent to this visit.  Plan of Care  Continue to monitor, repeat injection as needed  Follow-up plan:   No follow-ups on file.    Recent Visits Date Type Provider Dept  12/17/23 Office Visit Marcelino Nurse, MD [facility]  11/20/23 Procedure visit Marcelino Nurse, MD [facility]  10/21/23 Office Visit Marcelino Nurse, MD [facility]  Showing recent visits within past 90 days and meeting all other requirements Today's Visits Date Type Provider Dept  01/16/24 Appointment Marcelino Nurse, MD [facility]  Showing today's visits and meeting all other requirements Future Appointments No visits were found meeting these conditions. Showing future appointments within next 90 days and meeting all other requirements  I discussed the assessment and treatment plan with the patient. The patient was provided an opportunity to ask questions and all were answered. The patient agreed with the plan and demonstrated an understanding of the instructions.  Patient advised to call back or seek an in-person evaluation if the symptoms or condition worsens.  Duration of encounter: 15 minutes.  Note by: Nurse Marcelino, MD Date: 01/16/2024; Time: 4:02 PM
